# Patient Record
Sex: Male | Born: 1955
Health system: Southern US, Community
[De-identification: ages and names within clinical notes are randomized; demographics above are authoritative.]

## PROBLEM LIST (undated history)

## (undated) DIAGNOSIS — K861 Other chronic pancreatitis: Secondary | ICD-10-CM

## (undated) DIAGNOSIS — Z91018 Allergy to other foods: Secondary | ICD-10-CM

## (undated) DIAGNOSIS — A4902 Methicillin resistant Staphylococcus aureus infection, unspecified site: Secondary | ICD-10-CM

## (undated) DIAGNOSIS — Z87442 Personal history of urinary calculi: Secondary | ICD-10-CM

## (undated) DIAGNOSIS — M5020 Other cervical disc displacement, unspecified cervical region: Secondary | ICD-10-CM

## (undated) DIAGNOSIS — I10 Essential (primary) hypertension: Secondary | ICD-10-CM

## (undated) DIAGNOSIS — IMO0002 Reserved for concepts with insufficient information to code with codable children: Secondary | ICD-10-CM

## (undated) HISTORY — DX: Methicillin resistant Staphylococcus aureus infection, unspecified site: A49.02

## (undated) HISTORY — DX: Personal history of urinary calculi: Z87.442

## (undated) HISTORY — PX: DUPUYTREN CONTRACTURE RELEASE: SHX1478

## (undated) HISTORY — DX: Allergy to other foods: Z91.018

## (undated) HISTORY — DX: Other chronic pancreatitis: K86.1

## (undated) HISTORY — DX: Reserved for concepts with insufficient information to code with codable children: IMO0002

## (undated) HISTORY — DX: Essential (primary) hypertension: I10

---

## 1997-04-27 ENCOUNTER — Other Ambulatory Visit: Admission: RE | Admit: 1997-04-27 | Discharge: 1997-04-27 | Payer: Self-pay | Admitting: Obstetrics and Gynecology

## 1997-05-24 ENCOUNTER — Other Ambulatory Visit: Admission: RE | Admit: 1997-05-24 | Discharge: 1997-05-24 | Payer: Self-pay | Admitting: Obstetrics and Gynecology

## 2006-10-07 ENCOUNTER — Emergency Department (HOSPITAL_COMMUNITY): Admission: EM | Admit: 2006-10-07 | Discharge: 2006-10-07 | Payer: Self-pay | Admitting: Family Medicine

## 2006-10-09 ENCOUNTER — Emergency Department (HOSPITAL_COMMUNITY): Admission: EM | Admit: 2006-10-09 | Discharge: 2006-10-09 | Payer: Self-pay | Admitting: Emergency Medicine

## 2006-12-23 ENCOUNTER — Ambulatory Visit: Payer: Self-pay | Admitting: Gastroenterology

## 2006-12-30 ENCOUNTER — Ambulatory Visit: Payer: Self-pay | Admitting: Gastroenterology

## 2007-10-31 ENCOUNTER — Encounter: Admission: RE | Admit: 2007-10-31 | Discharge: 2007-10-31 | Payer: Self-pay | Admitting: Sports Medicine

## 2007-11-15 ENCOUNTER — Encounter: Admission: RE | Admit: 2007-11-15 | Discharge: 2007-11-15 | Payer: Self-pay | Admitting: Sports Medicine

## 2008-02-07 ENCOUNTER — Encounter: Admission: RE | Admit: 2008-02-07 | Discharge: 2008-02-07 | Payer: Self-pay | Admitting: Sports Medicine

## 2008-03-26 ENCOUNTER — Encounter: Payer: Self-pay | Admitting: Cardiovascular Disease

## 2008-03-26 ENCOUNTER — Ambulatory Visit: Payer: Self-pay | Admitting: Cardiovascular Disease

## 2008-03-26 ENCOUNTER — Ambulatory Visit: Payer: Self-pay

## 2009-03-21 ENCOUNTER — Encounter: Admission: RE | Admit: 2009-03-21 | Discharge: 2009-03-21 | Payer: Self-pay | Admitting: Neurosurgery

## 2009-03-24 ENCOUNTER — Encounter: Admission: RE | Admit: 2009-03-24 | Discharge: 2009-03-24 | Payer: Self-pay | Admitting: Neurosurgery

## 2010-02-09 ENCOUNTER — Encounter: Payer: Self-pay | Admitting: Urology

## 2010-10-28 ENCOUNTER — Inpatient Hospital Stay (INDEPENDENT_AMBULATORY_CARE_PROVIDER_SITE_OTHER)
Admission: RE | Admit: 2010-10-28 | Discharge: 2010-10-28 | Disposition: A | Discharge status: Home / Self Care | Payer: BC Managed Care – PPO | Source: Ambulatory Visit | Attending: Emergency Medicine | Admitting: Emergency Medicine

## 2010-10-28 DIAGNOSIS — L539 Erythematous condition, unspecified: Secondary | ICD-10-CM

## 2010-10-30 LAB — URINE MICROSCOPIC-ADD ON

## 2010-10-30 LAB — POCT URINALYSIS DIP (DEVICE)
Glucose, UA: NEGATIVE
Operator id: 126494
Specific Gravity, Urine: 1.015
Urobilinogen, UA: 0.2

## 2010-10-30 LAB — URINALYSIS, ROUTINE W REFLEX MICROSCOPIC
Glucose, UA: 100 — AB
Leukocytes, UA: NEGATIVE
Nitrite: NEGATIVE
Protein, ur: NEGATIVE
Urobilinogen, UA: 0.2

## 2012-05-11 ENCOUNTER — Emergency Department (HOSPITAL_COMMUNITY): Payer: 59

## 2012-05-11 ENCOUNTER — Emergency Department (HOSPITAL_COMMUNITY)
Admission: EM | Admit: 2012-05-11 | Discharge: 2012-05-11 | Disposition: A | Discharge status: Home / Self Care | Payer: 59 | Attending: Emergency Medicine | Admitting: Emergency Medicine

## 2012-05-11 DIAGNOSIS — IMO0001 Reserved for inherently not codable concepts without codable children: Secondary | ICD-10-CM | POA: Insufficient documentation

## 2012-05-11 DIAGNOSIS — Y9389 Activity, other specified: Secondary | ICD-10-CM | POA: Insufficient documentation

## 2012-05-11 DIAGNOSIS — F0781 Postconcussional syndrome: Secondary | ICD-10-CM | POA: Insufficient documentation

## 2012-05-11 DIAGNOSIS — R51 Headache: Secondary | ICD-10-CM | POA: Insufficient documentation

## 2012-05-11 DIAGNOSIS — R42 Dizziness and giddiness: Secondary | ICD-10-CM | POA: Insufficient documentation

## 2012-05-11 DIAGNOSIS — Y92410 Unspecified street and highway as the place of occurrence of the external cause: Secondary | ICD-10-CM | POA: Insufficient documentation

## 2012-05-11 LAB — POCT I-STAT, CHEM 8
BUN: 16 mg/dL (ref 6–23)
Calcium, Ion: 1.16 mmol/L (ref 1.12–1.23)
Creatinine, Ser: 1.1 mg/dL (ref 0.50–1.35)
Glucose, Bld: 100 mg/dL — ABNORMAL HIGH (ref 70–99)
Hemoglobin: 14.6 g/dL (ref 13.0–17.0)
TCO2: 24 mmol/L (ref 0–100)

## 2012-05-11 LAB — URINALYSIS, ROUTINE W REFLEX MICROSCOPIC
Bilirubin Urine: NEGATIVE
Hgb urine dipstick: NEGATIVE
Ketones, ur: NEGATIVE mg/dL
Protein, ur: NEGATIVE mg/dL
Urobilinogen, UA: 0.2 mg/dL (ref 0.0–1.0)

## 2012-05-11 MED ORDER — HYDROCODONE-ACETAMINOPHEN 5-325 MG PO TABS
1.0000 | ORAL_TABLET | Freq: Once | ORAL | Status: AC
  Administered 2012-05-11: 1 via ORAL
  Filled 2012-05-11: qty 1

## 2012-05-11 MED ORDER — DIAZEPAM 2 MG PO TABS
2.0000 mg | ORAL_TABLET | Freq: Once | ORAL | Status: AC
  Administered 2012-05-11: 2 mg via ORAL
  Filled 2012-05-11 (×2): qty 1

## 2012-05-11 MED ORDER — IBUPROFEN 200 MG PO TABS
600.0000 mg | ORAL_TABLET | Freq: Once | ORAL | Status: AC
  Administered 2012-05-11: 600 mg via ORAL
  Filled 2012-05-11: qty 1
  Filled 2012-05-11: qty 3

## 2012-05-11 MED ORDER — HYDROCODONE-ACETAMINOPHEN 5-325 MG PO TABS: 1.0000 | ORAL_TABLET | Freq: Four times a day (QID) | ORAL | Status: DC | PRN

## 2012-05-11 MED ORDER — NAPROXEN 500 MG PO TABS: 500.0000 mg | ORAL_TABLET | Freq: Two times a day (BID) | ORAL | Status: DC

## 2012-05-11 MED ORDER — DIAZEPAM 5 MG PO TABS: 5.0000 mg | ORAL_TABLET | Freq: Four times a day (QID) | ORAL | Status: DC | PRN

## 2012-05-11 NOTE — ED Notes (Signed)
Patient transported to radiology.Will obtain blood for labs upon patient's return

## 2012-05-11 NOTE — ED Notes (Signed)
PA at bedside.

## 2012-05-11 NOTE — ED Notes (Signed)
Patient transported to X-ray 

## 2012-05-11 NOTE — ED Notes (Signed)
MD at bedside. 
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
## 2012-05-11 NOTE — ED Notes (Signed)
Bed:WA25<BR> Expected date:<BR> Expected time:<BR> Means of arrival:<BR> Comments:<BR> ems

## 2012-05-11 NOTE — ED Provider Notes (Signed)
Medical screening examination/treatment/procedure(s) were performed by non-physician practitioner and as supervising physician I was immediately available for consultation/collaboration.   Lyanne Co, MD 05/11/12 2146
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
## 2012-05-11 NOTE — ED Notes (Signed)
MD at bedside./ PA

## 2012-05-27 ENCOUNTER — Emergency Department (INDEPENDENT_AMBULATORY_CARE_PROVIDER_SITE_OTHER)
Admission: EM | Admit: 2012-05-27 | Discharge: 2012-05-27 | Disposition: A | Discharge status: Home / Self Care | Payer: Self-pay | Source: Home / Self Care | Attending: Family Medicine | Admitting: Family Medicine

## 2012-05-27 ENCOUNTER — Encounter (HOSPITAL_COMMUNITY): Payer: Self-pay | Admitting: *Deleted

## 2012-05-27 ENCOUNTER — Ambulatory Visit: Payer: Self-pay

## 2012-05-27 DIAGNOSIS — M549 Dorsalgia, unspecified: Secondary | ICD-10-CM

## 2012-05-27 DIAGNOSIS — M542 Cervicalgia: Secondary | ICD-10-CM

## 2012-05-27 MED ORDER — CYCLOBENZAPRINE HCL 5 MG PO TABS: 5.0000 mg | ORAL_TABLET | Freq: Three times a day (TID) | ORAL | Status: DC | PRN

## 2012-05-27 MED ORDER — DICLOFENAC POTASSIUM 50 MG PO TABS: 50.0000 mg | ORAL_TABLET | Freq: Three times a day (TID) | ORAL | Status: DC
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
## 2012-05-27 NOTE — ED Notes (Signed)
Pt  Also  Reports  The  Sensation of  Tingling in his  Fingers
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
## 2012-05-30 ENCOUNTER — Encounter (HOSPITAL_COMMUNITY): Payer: Self-pay | Admitting: Emergency Medicine

## 2012-05-30 ENCOUNTER — Emergency Department (HOSPITAL_COMMUNITY)
Admission: EM | Admit: 2012-05-30 | Discharge: 2012-05-30 | Disposition: A | Discharge status: Home / Self Care | Payer: 59 | Attending: Emergency Medicine | Admitting: Emergency Medicine

## 2012-05-30 DIAGNOSIS — I10 Essential (primary) hypertension: Secondary | ICD-10-CM | POA: Insufficient documentation

## 2012-05-30 DIAGNOSIS — Z87891 Personal history of nicotine dependence: Secondary | ICD-10-CM | POA: Insufficient documentation

## 2012-05-30 DIAGNOSIS — Z8739 Personal history of other diseases of the musculoskeletal system and connective tissue: Secondary | ICD-10-CM | POA: Insufficient documentation

## 2012-05-30 DIAGNOSIS — Z79899 Other long term (current) drug therapy: Secondary | ICD-10-CM | POA: Insufficient documentation

## 2012-05-30 HISTORY — DX: Other cervical disc displacement, unspecified cervical region: M50.20

## 2012-05-30 LAB — POCT I-STAT, CHEM 8
BUN: 19 mg/dL (ref 6–23)
Creatinine, Ser: 1 mg/dL (ref 0.50–1.35)
Glucose, Bld: 91 mg/dL (ref 70–99)
Hemoglobin: 15.6 g/dL (ref 13.0–17.0)
TCO2: 25 mmol/L (ref 0–100)

## 2012-05-30 MED ORDER — HYDROCHLOROTHIAZIDE 25 MG PO TABS: 25.0000 mg | ORAL_TABLET | Freq: Every day | ORAL | Status: DC

## 2012-05-30 NOTE — ED Notes (Signed)
Pt reports "high blood pressure x 4 days" Pt complains of headache as well.
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
## 2012-06-10 ENCOUNTER — Ambulatory Visit (INDEPENDENT_AMBULATORY_CARE_PROVIDER_SITE_OTHER): Payer: 59 | Admitting: Internal Medicine

## 2012-06-10 VITALS — BP 180/96 | HR 88 | Temp 98.2°F | Resp 16 | Ht 70.25 in | Wt 182.6 lb

## 2012-06-10 DIAGNOSIS — I10 Essential (primary) hypertension: Secondary | ICD-10-CM

## 2012-06-10 DIAGNOSIS — R42 Dizziness and giddiness: Secondary | ICD-10-CM

## 2012-06-10 MED ORDER — LISINOPRIL-HYDROCHLOROTHIAZIDE 10-12.5 MG PO TABS: 1.0000 | ORAL_TABLET | Freq: Every day | ORAL | Status: DC
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
## 2012-06-10 NOTE — Patient Instructions (Signed)

## 2012-10-03 ENCOUNTER — Telehealth: Payer: Self-pay

## 2012-10-03 NOTE — Telephone Encounter (Signed)
Pt states since taking Lisinopril he has developed a persistent cough which he now associates with the timing of starting the meds,he would like change in rx.   Best phone [phone]   [facility] [address]

## 2012-10-04 NOTE — Telephone Encounter (Signed)
I have documented cough with lisinopril, patient has d/c this medication, what would you like to use instead?

## 2012-10-05 ENCOUNTER — Telehealth: Payer: Self-pay | Admitting: Radiology

## 2012-10-05 MED ORDER — LOSARTAN POTASSIUM 50 MG PO TABS: 50.0000 mg | ORAL_TABLET | Freq: Every day | ORAL | Status: DC

## 2012-10-05 NOTE — Telephone Encounter (Signed)
Will try losartan 50mg  daily.  Continue HCTZ 25mg  daily.  Monitor home BPs.  Follow up in 1 month

## 2012-10-05 NOTE — Telephone Encounter (Signed)
Thanks calld pt to advise

## 2012-10-05 NOTE — Telephone Encounter (Signed)
Pt has cough from Lisinopril , mssg sent to Dr Perrin Maltese, but Dr Perrin Maltese out of office. Patient has d/c meds, wants something else sent in. Phone [phone]

## 2012-10-07 NOTE — Telephone Encounter (Signed)
Joel Black has advised to try Losartan, patient has been advised to do this and will follow up as planned. He was advised to call if this does not help with his hypertension. He will check his BP weekly. To you FYI

## 2012-10-07 NOTE — Telephone Encounter (Signed)
rtc 

## 2012-10-21 ENCOUNTER — Ambulatory Visit: Payer: 59

## 2012-10-21 ENCOUNTER — Ambulatory Visit: Payer: 59 | Admitting: Family Medicine

## 2012-10-21 VITALS — BP 150/100 | HR 78 | Temp 98.1°F | Resp 18 | Ht 70.5 in | Wt 186.8 lb

## 2012-10-21 DIAGNOSIS — M5020 Other cervical disc displacement, unspecified cervical region: Secondary | ICD-10-CM

## 2012-10-21 DIAGNOSIS — I10 Essential (primary) hypertension: Secondary | ICD-10-CM

## 2012-10-21 MED ORDER — PREDNISONE 20 MG PO TABS: ORAL_TABLET | ORAL | Status: DC

## 2012-10-21 MED ORDER — LOSARTAN POTASSIUM 50 MG PO TABS: 50.0000 mg | ORAL_TABLET | Freq: Two times a day (BID) | ORAL | Status: DC

## 2012-10-21 MED ORDER — CYCLOBENZAPRINE HCL 5 MG PO TABS
ORAL_TABLET | ORAL | Status: DC
Start: 2012-10-21 — End: 2013-07-27
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
## 2012-10-21 NOTE — Patient Instructions (Addendum)
Please let me know how you are doing with your neck and arm in the next few days- Sooner if worse.   Remember not to take NSAID type medications such as aleve or ibuprofen while you are on the prednisone.

## 2012-10-22 LAB — COMPREHENSIVE METABOLIC PANEL
ALT: 24 U/L (ref 0–53)
AST: 20 U/L (ref 0–37)
Albumin: 4.5 g/dL (ref 3.5–5.2)
CO2: 28 mEq/L (ref 19–32)
Calcium: 9.5 mg/dL (ref 8.4–10.5)
Chloride: 104 mEq/L (ref 96–112)
Creat: 1.1 mg/dL (ref 0.50–1.35)
Potassium: 4.3 mEq/L (ref 3.5–5.3)
Total Protein: 7.2 g/dL (ref 6.0–8.3)

## 2012-10-23 ENCOUNTER — Encounter: Payer: Self-pay | Admitting: Family Medicine

## 2012-10-31 ENCOUNTER — Telehealth: Payer: Self-pay

## 2012-10-31 DIAGNOSIS — R200 Anesthesia of skin: Secondary | ICD-10-CM

## 2012-10-31 DIAGNOSIS — M542 Cervicalgia: Secondary | ICD-10-CM

## 2012-10-31 NOTE — Telephone Encounter (Signed)
Called and spoke with him.  He feels 50% better but is about to finish up the prednisone.  He would like to hold off on the MRI right now due to cost concerns, but will call me if he does want to go ahead with it.  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  He would appreciate a letter to this effect.

## 2012-10-31 NOTE — Telephone Encounter (Signed)
Called him back- no answer.  LMOM, will try back

## 2012-10-31 NOTE — Telephone Encounter (Signed)
Your note indicated if not better, proceed with MRI , pended the referral please advise

## 2012-10-31 NOTE — Telephone Encounter (Signed)
PT WOULD LIKE TO SPEAK WITH SOMEONE ABOUT HIS CONDITION. WAS SEEN FOR NECK AND ARM PAIN, IS ABOUT 50% BETTER, BUT DON'T THINK HE WILL EVER BE ABLE TO DO HIS JOB ANYMORE PLEASE CALL [phone]. MAY NEED THE DR TO WRITE A DOCUMENT ABOUT HIS PROBLEM

## 2012-11-05 ENCOUNTER — Telehealth: Payer: Self-pay

## 2012-11-05 DIAGNOSIS — M5020 Other cervical disc displacement, unspecified cervical region: Secondary | ICD-10-CM

## 2012-11-05 NOTE — Telephone Encounter (Signed)
Spoke with pt, he wants to know if he should get another course of of prednisone before getting an MRI done. He is still only 50% better. Dr Patsy Lager please advise.

## 2012-11-06 MED ORDER — PREDNISONE 20 MG PO TABS: ORAL_TABLET | ORAL | Status: DC

## 2012-11-06 NOTE — Telephone Encounter (Signed)
Called and left detailed message.  He took a 10 day course of prednisone starting on 10/3 which did help.  It would be reasonable to try a second course prior to doing an MRI if he would like.   Will send an rx for another 40x5, 20x5 course of prednisone.  Would recommend that he wait a few more days to allow at least a full week to pass since he finished the last course.

## 2012-11-07 ENCOUNTER — Ambulatory Visit (INDEPENDENT_AMBULATORY_CARE_PROVIDER_SITE_OTHER): Payer: 59 | Admitting: Physician Assistant

## 2012-11-07 ENCOUNTER — Encounter: Payer: Self-pay | Admitting: Physician Assistant

## 2012-11-07 VITALS — BP 140/84 | HR 88 | Temp 98.5°F | Resp 16 | Ht 70.5 in | Wt 183.2 lb

## 2012-11-07 DIAGNOSIS — R030 Elevated blood-pressure reading, without diagnosis of hypertension: Secondary | ICD-10-CM

## 2012-11-07 DIAGNOSIS — Z0000 Encounter for general adult medical examination without abnormal findings: Secondary | ICD-10-CM

## 2012-11-07 DIAGNOSIS — I10 Essential (primary) hypertension: Secondary | ICD-10-CM

## 2012-11-07 DIAGNOSIS — M25521 Pain in right elbow: Secondary | ICD-10-CM

## 2012-11-07 DIAGNOSIS — R202 Paresthesia of skin: Secondary | ICD-10-CM

## 2012-11-07 DIAGNOSIS — M25529 Pain in unspecified elbow: Secondary | ICD-10-CM

## 2012-11-07 DIAGNOSIS — R21 Rash and other nonspecific skin eruption: Secondary | ICD-10-CM

## 2012-11-07 DIAGNOSIS — R252 Cramp and spasm: Secondary | ICD-10-CM

## 2012-11-07 LAB — CBC WITH DIFFERENTIAL/PLATELET
Eosinophils Absolute: 0.2 10*3/uL (ref 0.0–0.7)
Eosinophils Relative: 3 % (ref 0–5)
HCT: 41.3 % (ref 39.0–52.0)
Hemoglobin: 14.8 g/dL (ref 13.0–17.0)
Lymphocytes Relative: 25 % (ref 12–46)
Lymphs Abs: 1.5 10*3/uL (ref 0.7–4.0)
MCH: 33.1 pg (ref 26.0–34.0)
MCV: 92.4 fL (ref 78.0–100.0)
Monocytes Relative: 10 % (ref 3–12)
RBC: 4.47 MIL/uL (ref 4.22–5.81)
WBC: 6.1 10*3/uL (ref 4.0–10.5)

## 2012-11-07 LAB — COMPREHENSIVE METABOLIC PANEL
ALT: 44 U/L (ref 0–53)
CO2: 26 mEq/L (ref 19–32)
Calcium: 9.3 mg/dL (ref 8.4–10.5)
Chloride: 107 mEq/L (ref 96–112)
Creat: 1.03 mg/dL (ref 0.50–1.35)
Glucose, Bld: 103 mg/dL — ABNORMAL HIGH (ref 70–99)
Total Bilirubin: 0.5 mg/dL (ref 0.3–1.2)
Total Protein: 6.6 g/dL (ref 6.0–8.3)

## 2012-11-07 LAB — LIPID PANEL
Cholesterol: 198 mg/dL (ref 0–200)
HDL: 56 mg/dL (ref >39)
Total CHOL/HDL Ratio: 3.5 Ratio

## 2012-11-07 LAB — IFOBT (OCCULT BLOOD): IFOBT: NEGATIVE

## 2012-11-07 MED ORDER — HYDROCHLOROTHIAZIDE 25 MG PO TABS: 25.0000 mg | ORAL_TABLET | Freq: Every day | ORAL | Status: DC

## 2012-11-07 MED ORDER — LOSARTAN POTASSIUM 50 MG PO TABS: 50.0000 mg | ORAL_TABLET | Freq: Two times a day (BID) | ORAL | Status: DC
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
## 2012-11-07 NOTE — Progress Notes (Signed)
  Subjective:    Patient ID: Joel Black, male    DOB: February 21, 1955, 57 y.o.   MRN: [id number]  HPI    Review of Systems  Constitutional: Positive for fatigue.  HENT: Positive for dental problem.   Respiratory: Negative.   Cardiovascular: Negative.   Gastrointestinal: Negative.   Endocrine: Negative.   Genitourinary: Negative.   Musculoskeletal: Positive for myalgias.  Skin: Negative.   Allergic/Immunologic: Negative.   Neurological: Negative.   Hematological: Negative.   Psychiatric/Behavioral: Positive for agitation.       Objective:   Physical Exam        Assessment & Plan:

## 2012-11-08 ENCOUNTER — Other Ambulatory Visit: Payer: Self-pay

## 2012-11-08 DIAGNOSIS — I10 Essential (primary) hypertension: Secondary | ICD-10-CM

## 2012-11-08 LAB — B. BURGDORFI ANTIBODIES: B burgdorferi Ab IgG+IgM: 0.65 {ISR}

## 2012-11-08 LAB — TSH: TSH: 1.653 u[IU]/mL (ref 0.350–4.500)

## 2012-11-08 LAB — PSA: PSA: 0.65 ng/mL (ref <=4.00)

## 2012-11-08 LAB — EHRLICHIA ANTIBODY PANEL

## 2012-11-08 MED ORDER — HYDROCHLOROTHIAZIDE 25 MG PO TABS: 25.0000 mg | ORAL_TABLET | Freq: Every day | ORAL | Status: DC

## 2012-11-15 ENCOUNTER — Telehealth: Payer: Self-pay

## 2012-11-15 NOTE — Telephone Encounter (Signed)
RYAN - PT WOULD LIKE TO KNOW IF HIS MRI RESULTS ARE BACK [phone]

## 2012-11-16 NOTE — Telephone Encounter (Signed)
Advised him it is not final yet. Will check again for him tomorrow. To you FYI

## 2012-11-18 NOTE — Telephone Encounter (Signed)
Any update on this?

## 2012-11-18 NOTE — Telephone Encounter (Signed)
Called patient advised degenerative changes only, but is the xray report. He has not had the scan yet. He is asking for a letter regarding his work status. 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 Decreased muscle tone.

## 2012-11-18 NOTE — Telephone Encounter (Signed)
Called for peer-to-peer.  Dr. Lahoma Rocker approved request. 45 days (01/02/2013) [id number]

## 2012-11-18 NOTE — Telephone Encounter (Signed)
This has been denied, by insurance. Patient complains of loss of strength in his right arm, loss of muscle tone also. There is documentation of weakness of the arm in the notes. We can call for peer to peer to try to get the insurance to reconsider given this information, is [employer] 1 [id number] option 3 the insurance number is [id number]. Can one of you call for him to get the prior authorization>? He needs the scan.

## 2012-11-19 NOTE — Telephone Encounter (Signed)
Await results

## 2012-11-21 ENCOUNTER — Other Ambulatory Visit: Payer: Self-pay | Admitting: Radiology

## 2012-11-21 ENCOUNTER — Ambulatory Visit: Payer: 59

## 2012-11-21 DIAGNOSIS — Z1389 Encounter for screening for other disorder: Secondary | ICD-10-CM

## 2012-11-21 NOTE — Telephone Encounter (Signed)
Amy spoke with Lupita Leash and gave her the prior auth number.

## 2012-11-21 NOTE — Telephone Encounter (Signed)
Thanks. Patient scheduled Orbits ordered, he is a Psychologist, educational.

## 2012-11-25 ENCOUNTER — Ambulatory Visit
Admission: RE | Admit: 2012-11-25 | Discharge: 2012-11-25 | Disposition: A | Discharge status: Home / Self Care | Payer: 59 | Source: Ambulatory Visit | Attending: Physician Assistant | Admitting: Physician Assistant

## 2012-11-25 DIAGNOSIS — Z1389 Encounter for screening for other disorder: Secondary | ICD-10-CM

## 2012-11-27 ENCOUNTER — Ambulatory Visit
Admission: RE | Admit: 2012-11-27 | Discharge: 2012-11-27 | Disposition: A | Discharge status: Home / Self Care | Payer: 59 | Source: Ambulatory Visit | Attending: Physician Assistant | Admitting: Physician Assistant

## 2012-11-27 DIAGNOSIS — R202 Paresthesia of skin: Secondary | ICD-10-CM

## 2012-11-27 DIAGNOSIS — M25521 Pain in right elbow: Secondary | ICD-10-CM

## 2012-11-28 ENCOUNTER — Other Ambulatory Visit: Payer: Self-pay | Admitting: Physician Assistant

## 2012-11-28 DIAGNOSIS — M4802 Spinal stenosis, cervical region: Secondary | ICD-10-CM

## 2012-11-28 DIAGNOSIS — R29898 Other symptoms and signs involving the musculoskeletal system: Secondary | ICD-10-CM

## 2012-12-07 ENCOUNTER — Telehealth: Payer: Self-pay

## 2012-12-07 NOTE — Telephone Encounter (Signed)
PT WOULD LIKE TO KNOW IF WE COULD REFER HIM TO DR Venetia Maxon THE NEURO SURGEON , HAS HAD HIM YEARS AGO AND WOULD LIKE TO SEE HIM AGAIN PLEASE CALL [phone]

## 2012-12-07 NOTE — Telephone Encounter (Signed)
Information was sent called him he has not heard back yet. Will you check on this referral? It was sent on 11/10

## 2013-03-29 ENCOUNTER — Encounter (HOSPITAL_COMMUNITY): Payer: Self-pay | Admitting: Emergency Medicine

## 2013-03-29 ENCOUNTER — Emergency Department (HOSPITAL_COMMUNITY)
Admission: EM | Admit: 2013-03-29 | Discharge: 2013-03-29 | Disposition: A | Discharge status: Home / Self Care | Payer: 59 | Source: Home / Self Care | Attending: Family Medicine | Admitting: Family Medicine

## 2013-03-29 DIAGNOSIS — M109 Gout, unspecified: Secondary | ICD-10-CM

## 2013-03-29 LAB — URIC ACID: URIC ACID, SERUM: 5.1 mg/dL (ref 4.0–7.8)

## 2013-03-29 LAB — CBC WITH DIFFERENTIAL/PLATELET
BASOS ABS: 0 10*3/uL (ref 0.0–0.1)
BASOS PCT: 0 % (ref 0–1)
EOS ABS: 0.3 10*3/uL (ref 0.0–0.7)
EOS PCT: 2 % (ref 0–5)
HCT: 44.2 % (ref 39.0–52.0)
Hemoglobin: 15.9 g/dL (ref 13.0–17.0)
LYMPHS PCT: 22 % (ref 12–46)
Lymphs Abs: 2.6 10*3/uL (ref 0.7–4.0)
MCH: 33 pg (ref 26.0–34.0)
MCHC: 36 g/dL (ref 30.0–36.0)
MCV: 91.7 fL (ref 78.0–100.0)
Monocytes Absolute: 1 10*3/uL (ref 0.1–1.0)
Monocytes Relative: 8 % (ref 3–12)
Neutro Abs: 8 10*3/uL — ABNORMAL HIGH (ref 1.7–7.7)
Neutrophils Relative %: 67 % (ref 43–77)
PLATELETS: 299 10*3/uL (ref 150–400)
RBC: 4.82 MIL/uL (ref 4.22–5.81)
RDW: 12.5 % (ref 11.5–15.5)
WBC: 11.9 10*3/uL — AB (ref 4.0–10.5)

## 2013-03-29 MED ORDER — PREDNISONE 20 MG PO TABS
ORAL_TABLET | ORAL | Status: AC
  Filled 2013-03-29: qty 3

## 2013-03-29 MED ORDER — NAPROXEN 500 MG PO TABS: 500.0000 mg | ORAL_TABLET | Freq: Two times a day (BID) | ORAL | Status: DC

## 2013-03-29 MED ORDER — COLCHICINE 0.6 MG PO TABS: 0.6000 mg | ORAL_TABLET | Freq: Two times a day (BID) | ORAL | Status: DC

## 2013-03-29 MED ORDER — HYDROCODONE-ACETAMINOPHEN 5-325 MG PO TABS: 1.0000 | ORAL_TABLET | ORAL | Status: DC | PRN

## 2013-03-29 MED ORDER — PREDNISONE 20 MG PO TABS
60.0000 mg | ORAL_TABLET | Freq: Once | ORAL | Status: AC
  Administered 2013-03-29: 60 mg via ORAL

## 2013-03-29 MED ORDER — PREDNISONE 10 MG PO KIT: PACK | ORAL | Status: DC

## 2013-03-29 NOTE — ED Notes (Signed)
Patient complains of left great toe pain; pain started today; states some heat at left great toe.

## 2013-03-29 NOTE — Discharge Instructions (Signed)

## 2013-03-29 NOTE — ED Notes (Deleted)
Patient states she twisted her ankle this evening after stepping off the curb at 6:50pm this afternoon; states can't put any weight on right foot; states numbness and swelling.  

## 2013-03-29 NOTE — ED Provider Notes (Signed)
CSN: [id number]     Arrival date & time 03/29/13  1943 History   First MD Initiated Contact with Patient 03/29/13 2014     Chief Complaint  Patient presents with  . Toe Pain   (Consider location/radiation/quality/duration/timing/severity/associated sxs/prior Treatment) HPI Comments: 58 year old male presents for evaluation of severe left great toe pain. This began fairly acutely this evening, about 3 hours ago. He first noticed some pain at about noon breakout a lot worse at around 5 PM. The toe is red, swollen, and tender. He has never had this before. He has no history of gout. He does not feel sick. He rates the pain as 10 out of 10.  Patient is a 58 y.o. male presenting with toe pain.  Toe Pain Pertinent negatives include no chest pain, no abdominal pain and no shortness of breath.    Past Medical History  Diagnosis Date  . Herniated cervical disc   . Ulcer   . Hypertension    History reviewed. No pertinent past surgical history. Family History  Problem Relation Age of Onset  . Cancer Father     lung  . Diabetes Sister    History  Substance Use Topics  . Smoking status: Former Smoker    Types: Cigarettes    Quit date: 06/11/1986  . Smokeless tobacco: Not on file  . Alcohol Use: Yes     Comment: 2 glasses with dinner    Review of Systems  Constitutional: Negative for fever, chills and fatigue.  HENT: Negative for sore throat.   Eyes: Negative for visual disturbance.  Respiratory: Negative for cough and shortness of breath.   Cardiovascular: Negative for chest pain, palpitations and leg swelling.  Gastrointestinal: Negative for nausea, vomiting, abdominal pain, diarrhea and constipation.  Genitourinary: Negative for dysuria, urgency, frequency and hematuria.  Musculoskeletal: Positive for arthralgias.       See history of present illness  Skin: Positive for color change. Negative for rash.  Neurological: Negative for dizziness, weakness and light-headedness.     Allergies  Review of patient's allergies indicates no active allergies.  Home Medications   Current Outpatient Rx  Name  Route  Sig  Dispense  Refill  . aspirin 81 MG tablet   Oral   Take 81 mg by mouth daily.         Marland Kitchen ibuprofen (ADVIL,MOTRIN) 200 MG tablet   Oral   Take 200 mg by mouth every 6 (six) hours as needed for pain.         Marland Kitchen lisinopril-hydrochlorothiazide (PRINZIDE,ZESTORETIC) 10-12.5 MG per tablet   Oral   Take 1 tablet by mouth daily.         Marland Kitchen losartan (COZAAR) 50 MG tablet   Oral   Take 1 tablet (50 mg total) by mouth 2 (two) times daily.   180 tablet   1   . Omega-3 Fatty Acids (FISH OIL PO)   Oral   Take by mouth.         . colchicine 0.6 MG tablet   Oral   Take 1 tablet (0.6 mg total) by mouth 2 (two) times daily.   30 tablet   0   . cyclobenzaprine (FLEXERIL) 5 MG tablet      Take 1/2 or 1 tablet twice a day as needed for muscle spasm   30 tablet   0   . diazepam (VALIUM) 5 MG tablet   Oral   Take 1 tablet (5 mg total) by mouth every 6 (six) hours  as needed for anxiety.   15 tablet   0   . hydrochlorothiazide (HYDRODIURIL) 25 MG tablet   Oral   Take 1 tablet (25 mg total) by mouth daily.   90 tablet   1   . HYDROcodone-acetaminophen (NORCO) 5-325 MG per tablet   Oral   Take 1 tablet by mouth every 4 (four) hours as needed for moderate pain.   20 tablet   0   . naproxen (NAPROSYN) 500 MG tablet   Oral   Take 1 tablet (500 mg total) by mouth 2 (two) times daily.   30 tablet   0   . predniSONE (DELTASONE) 20 MG tablet      Take 2 pills by mouth daily for 5 days, then 1 pill by mouth daily for 5 dyas   15 tablet   0   . PredniSONE 10 MG KIT      12 day taper dose pack. Use as directed   48 each   0    BP 195/114  Pulse 81  Temp(Src) 97.4 F (36.3 C) (Oral)  SpO2 95% Physical Exam  Nursing note and vitals reviewed. Constitutional: He is oriented to person, place, and time. He appears well-developed and  well-nourished. No distress.  HENT:  Head: Normocephalic.  Cardiovascular: Normal rate, regular rhythm and normal heart sounds.   Pulmonary/Chest: Effort normal and breath sounds normal. No respiratory distress.  Musculoskeletal:       Left foot: He exhibits tenderness and swelling. He exhibits normal capillary refill.       Feet:  Neurological: He is alert and oriented to person, place, and time. Coordination normal.  Skin: Skin is warm and dry. No rash noted. He is not diaphoretic.  Psychiatric: He has a normal mood and affect. Judgment normal.    ED Course  Procedures (including critical care time) Labs Review Labs Reviewed  CBC WITH DIFFERENTIAL - Abnormal; Notable for the following:    WBC 11.9 (*)    Neutro Abs 8.0 (*)    All other components within normal limits  URIC ACID   Imaging Review No results found.   MDM   1. Podagra    Minimal neutrophilic leukocytosis c/w gout.  Afebrile and no overlying breaks in skin which may lead to septic joint.  I believe this is an initial gout attack. Uric acid level is pending. Treating with colchicine, prednisone, naprosyn, norco.  F/u if not improving in a few days.    Discharge Medication List as of 03/29/2013  8:53 PM    START taking these medications   Details  colchicine 0.6 MG tablet Take 1 tablet (0.6 mg total) by mouth 2 (two) times daily., Starting 03/29/2013, Until Discontinued, Print    HYDROcodone-acetaminophen (NORCO) 5-325 MG per tablet Take 1 tablet by mouth every 4 (four) hours as needed for moderate pain., Starting 03/29/2013, Until Discontinued, Print    naproxen (NAPROSYN) 500 MG tablet Take 1 tablet (500 mg total) by mouth 2 (two) times daily., Starting 03/29/2013, Until Discontinued, Print    PredniSONE 10 MG KIT 12 day taper dose pack. Use as directed, Print           Liam Graham, PA-C 03/29/13 2149

## 2013-03-30 NOTE — ED Provider Notes (Signed)
Medical screening examination/treatment/procedure(s) were performed by a resident physician or non-physician practitioner and as the supervising physician I was immediately available for consultation/collaboration.  Elon Lomeli, MD    Gennavieve Huq S Uyen Eichholz, MD 03/30/13 0810 

## 2013-07-27 ENCOUNTER — Ambulatory Visit (INDEPENDENT_AMBULATORY_CARE_PROVIDER_SITE_OTHER): Payer: 59

## 2013-07-27 ENCOUNTER — Ambulatory Visit (HOSPITAL_COMMUNITY)
Admission: RE | Admit: 2013-07-27 | Discharge: 2013-07-27 | Disposition: A | Discharge status: Home / Self Care | Payer: 59 | Source: Ambulatory Visit | Attending: Family Medicine | Admitting: Family Medicine

## 2013-07-27 ENCOUNTER — Ambulatory Visit (INDEPENDENT_AMBULATORY_CARE_PROVIDER_SITE_OTHER): Payer: 59 | Admitting: Family Medicine

## 2013-07-27 VITALS — BP 150/100 | HR 101 | Temp 98.6°F | Resp 18 | Ht 70.5 in | Wt 186.8 lb

## 2013-07-27 DIAGNOSIS — R0602 Shortness of breath: Secondary | ICD-10-CM

## 2013-07-27 DIAGNOSIS — R059 Cough, unspecified: Secondary | ICD-10-CM

## 2013-07-27 DIAGNOSIS — I2699 Other pulmonary embolism without acute cor pulmonale: Secondary | ICD-10-CM | POA: Insufficient documentation

## 2013-07-27 DIAGNOSIS — R911 Solitary pulmonary nodule: Secondary | ICD-10-CM | POA: Insufficient documentation

## 2013-07-27 DIAGNOSIS — J9819 Other pulmonary collapse: Secondary | ICD-10-CM | POA: Insufficient documentation

## 2013-07-27 DIAGNOSIS — R05 Cough: Secondary | ICD-10-CM

## 2013-07-27 DIAGNOSIS — I10 Essential (primary) hypertension: Secondary | ICD-10-CM

## 2013-07-27 LAB — CBC
HCT: 45.4 % (ref 39.0–52.0)
Hemoglobin: 16.1 g/dL (ref 13.0–17.0)
MCH: 32.1 pg (ref 26.0–34.0)
MCHC: 35.5 g/dL (ref 30.0–36.0)
MCV: 90.6 fL (ref 78.0–100.0)
PLATELETS: 294 10*3/uL (ref 150–400)
RBC: 5.01 MIL/uL (ref 4.22–5.81)
RDW: 13.3 % (ref 11.5–15.5)
WBC: 6.9 10*3/uL (ref 4.0–10.5)

## 2013-07-27 LAB — POCT I-STAT CREATININE: Creatinine, Ser: 1.2 mg/dL (ref 0.50–1.35)

## 2013-07-27 LAB — D-DIMER, QUANTITATIVE: D-Dimer, Quant: 0.84 ug/mL-FEU — ABNORMAL HIGH (ref 0.00–0.48)

## 2013-07-27 LAB — TROPONIN I: TROPONIN I: 0.02 ng/mL (ref <0.06)

## 2013-07-27 MED ORDER — IOHEXOL 350 MG/ML SOLN
100.0000 mL | Freq: Once | INTRAVENOUS | Status: AC | PRN
  Administered 2013-07-27: 100 mL via INTRAVENOUS

## 2013-07-27 MED ORDER — DOXYCYCLINE HYCLATE 100 MG PO TABS: 100.0000 mg | ORAL_TABLET | Freq: Two times a day (BID) | ORAL | Status: DC

## 2013-07-27 NOTE — Patient Instructions (Signed)
You can go ahead and start taking the doxycycline to cover you for pneumonia and any tick borne illness.  I will give you a call later on today with your STAT labs.

## 2013-07-27 NOTE — Progress Notes (Addendum)
[facility] [address], [place] [address] [phone]  Date:  07/27/2013   Name:  Joel Black   DOB:  02/19/1955   MRN:  [id number]  PCP:  No PCP Per Patient    Chief Complaint: Insect Bite, Fatigue, Shortness of Breath and Cough   History of Present Illness:  Joel Black is a 58 y.o. very pleasant male patient who presents with the following:  Here today with illness. He is worried because he had a tick on his leg in April.  The tick was not swollen, but did leave a mark on his leg that took 1.5 months to heal.  He wonders if this might be related to his current sx: notes that for a couple of weeks he has felt fatigued, is SOB, and has a cough productive of mucus.   He has not noted a fever.  No chest pain at rest or with exercise.   He is taking his losartan like usual.   No GI symptoms.   He will want to take a nap in the afternoon, and is sleeping more than usual.   No cardiac history except for HTN.  He is not sure if he ever had a stress test.  No family history of heart problems.  No headache  Married, quit smoking a long time ago  Patient Active Problem List   Diagnosis Date Noted  . HTN (hypertension) 10/21/2012    Past Medical History  Diagnosis Date  . Herniated cervical disc   . Ulcer   . Hypertension     History reviewed. No pertinent past surgical history.  History  Substance Use Topics  . Smoking status: Former Smoker    Types: Cigarettes    Quit date: 06/11/1986  . Smokeless tobacco: Not on file  . Alcohol Use: Yes     Comment: 2 glasses with dinner    Family History  Problem Relation Age of Onset  . Cancer Father     lung  . Diabetes Sister     No Known Allergies  Medication list has been reviewed and updated.  Current Outpatient Prescriptions on File Prior to Visit  Medication Sig Dispense Refill  . losartan (COZAAR) 50 MG tablet Take 1 tablet (50 mg total) by mouth 2 (two) times daily.   180 tablet  1  . Omega-3 Fatty Acids (FISH OIL PO) Take by mouth.       No current facility-administered medications on file prior to visit.    Review of Systems:  As per HPI- otherwise negative.   Physical Examination: Filed Vitals:   07/27/13 1151  BP: 180/110  Pulse: 101  Temp: 98.6 F (37 C)  Resp: 18   Filed Vitals:   07/27/13 1151  Height: 5' 10.5" (1.791 m)  Weight: 186 lb 12.8 oz (84.732 kg)   Body mass index is 26.42 kg/(m^2). Ideal Body Weight: Weight in (lb) to have BMI = 25: 176.4  GEN: WDWN, NAD, Non-toxic, A & O x 3, looks well HEENT: Atraumatic, Normocephalic. Neck supple. No masses, No LAD.  Bilateral TM wnl, oropharynx normal.  PEERL,EOMI.   Ears and Nose: No external deformity. CV: RRR, No M/G/R. No JVD. No thrill. No extra heart sounds. PULM: CTA B, no wheezes, crackles, rhonchi. No retractions. No resp. distress. No accessory muscle use. ABD: S, NT, ND. No rebound. No HSM. EXTR: No c/c/e NEURO Normal gait.  PSYCH: Normally interactive. Conversant. Not depressed or anxious appearing.  Calm demeanor.  There is a faint area of hyperpigmentation on the left distal thigh- this is his healed tick bite mark.   No LE edema, no calf tenderness or swelling   EKG: SR, rate 90.  Minimal to no ST depression. Machine read as non- specific ST depression.  Compared to EKG from last year- no significant change   [facility] reading (PRIMARY) by  Dr. Patsy Lager. CXR:  No effusion. Possible minimal infiltrate retrocardiac  EXAM: CHEST 2 VIEW  COMPARISON: 12/08/2007.  FINDINGS: The lungs are clear without focal infiltrate, edema, pneumothorax or pleural effusion. Hyperexpansion suggests emphysema. The cardiopericardial silhouette is within normal limits for size. Imaged bony structures of the thorax are intact.  IMPRESSION: Stable. No acute cardiopulmonary findings  Assessment and Plan: SOB (shortness of breath) - Plan: doxycycline (VIBRA-TABS) 100 MG tablet,  D-dimer, quantitative, Troponin I, CT Angio Chest W/Cm &/Or Wo Cm, BUN+Creat  Accelerated hypertension - Plan: EKG 12-Lead, Troponin I, BUN+Creat  Cough - Plan: DG Chest 2 View, doxycycline (VIBRA-TABS) 100 MG tablet, CBC, Comprehensive metabolic panel, BUN+Creat  Joel Black is here today with SOB, elevated BP, and transient tachycardia.  His lungs are clear and EKG is non- acute. He does not have any CP.  Discussed with him in detail. Consider possibility of PE; will send out a D dimer.  Also troponin I due to elevated BP and possible abnormality of EKG, although he does not have any CP.  Await these results and call him.   Signed Abbe Amsterdam, MD  Received his stat labs:  Results for orders placed in visit on 07/27/13  CBC      Result Value Ref Range   WBC 6.9  4.0 - 10.5 K/uL   RBC 5.01  4.22 - 5.81 MIL/uL   Hemoglobin 16.1  13.0 - 17.0 g/dL   HCT 16.1  09.6 - 04.5 %   MCV 90.6  78.0 - 100.0 fL   MCH 32.1  26.0 - 34.0 pg   MCHC 35.5  30.0 - 36.0 g/dL   RDW 40.9  81.1 - 91.4 %   Platelets 294  150 - 400 K/uL  D-DIMER, QUANTITATIVE      Result Value Ref Range   D-Dimer, Quant 0.84 (*) 0.00 - 0.48 ug/mL-FEU  TROPONIN I      Result Value Ref Range   Troponin I 0.02  <0.06 ng/mL   D dimer is up, needs a CT angio.  Called cell number- no answer and MB full.   Called back later and reached him, arranged CT angio for this evening.    Received CT results- no PE.  Called and discussed with his wife who answered phone.  Continue doxycycline and I will give them a call tomorrow to check on him.    Called 7/10:  Discussed with his wife who answered. She reports that Joel Black is feeling better today, he will complete course of abx.  Recommended cardiology referral for possible stress test and she is ok with this plan

## 2013-07-28 LAB — COMPREHENSIVE METABOLIC PANEL
ALBUMIN: 4.5 g/dL (ref 3.5–5.2)
ALK PHOS: 66 U/L (ref 39–117)
ALT: 25 U/L (ref 0–53)
AST: 20 U/L (ref 0–37)
BILIRUBIN TOTAL: 0.7 mg/dL (ref 0.2–1.2)
BUN: 16 mg/dL (ref 6–23)
CO2: 27 mEq/L (ref 19–32)
Calcium: 9.8 mg/dL (ref 8.4–10.5)
Chloride: 104 mEq/L (ref 96–112)
Creat: 0.99 mg/dL (ref 0.50–1.35)
Glucose, Bld: 110 mg/dL — ABNORMAL HIGH (ref 70–99)
POTASSIUM: 3.9 meq/L (ref 3.5–5.3)
SODIUM: 139 meq/L (ref 135–145)
TOTAL PROTEIN: 7.2 g/dL (ref 6.0–8.3)

## 2013-07-28 NOTE — Addendum Note (Signed)
Addended by: Abbe AmsterdamOPLAND, JESSICA C on: 07/28/2013 12:49 PM   Modules accepted: Orders

## 2013-07-31 ENCOUNTER — Telehealth: Payer: Self-pay

## 2013-07-31 DIAGNOSIS — R911 Solitary pulmonary nodule: Secondary | ICD-10-CM

## 2013-07-31 DIAGNOSIS — R0602 Shortness of breath: Secondary | ICD-10-CM

## 2013-07-31 NOTE — Telephone Encounter (Signed)
PT WOULD LIKE DR COPLAND TO KNOW HE WANTED TO GO AHEAD AND HAVE THE STRESS DONE. PLEASE CALL [phone]

## 2013-07-31 NOTE — Telephone Encounter (Signed)
Pt wants dr copland to know that her is ready to go thru with the stress test referral   Best number [phone]

## 2013-07-31 NOTE — Telephone Encounter (Signed)
Called and was able to get an appt with Dr. Jacinto HalimGanji tomorrow 7/14 at 11:30.  Called and discussed with his wife.

## 2013-07-31 NOTE — Telephone Encounter (Signed)
Referral has been entered. Pt advised waiting for Cardiology to schedule exam.

## 2013-08-01 ENCOUNTER — Telehealth: Payer: Self-pay | Admitting: Family Medicine

## 2013-08-01 NOTE — Telephone Encounter (Signed)
[facility]

## 2013-08-01 NOTE — Telephone Encounter (Signed)
Called [facility] per Dr. Cyndie Chimeopland's request and canceled Renae FicklePaul Melching's appointment that was scheduled in august due to the fact of him being seen on today by another cardiology doctor.

## 2013-08-01 NOTE — Telephone Encounter (Signed)
Called and discussed with him.  He already saw Ganji this am, they adjusted his medications and plan to do some follow-up testing.  He feels reassured.  Reminded him that we need a follow-up CT in about 3 months for his lung nodule, will go ahead and get this scheduled.

## 2013-08-03 NOTE — Addendum Note (Signed)
Addended by: Abbe AmsterdamOPLAND, Toneisha Savary C on: 08/03/2013 10:10 AM   Modules accepted: Orders

## 2013-08-14 NOTE — Telephone Encounter (Signed)
Message for Dr. Patsy Lageropland regarding Cat scan, he says his insurance will not cover it, and he does not have the money to pay for it, Pt says he does not know what to do, he can still feel fluid in lungs when he breaths and coughs, please advice pt

## 2013-08-15 ENCOUNTER — Other Ambulatory Visit: Payer: 59

## 2013-08-15 DIAGNOSIS — Z0271 Encounter for disability determination: Secondary | ICD-10-CM

## 2013-08-16 NOTE — Telephone Encounter (Signed)
Called and spoke with him.   He is actually ok, just still coughing some.  explained that he is not supposed to have his follow-up CT for 3 months anyway; he was called to do the CT this week but did not go due to financial concerns which is a good thing as it is too early.  He is relieved to hear this and thinks he will be able to do the CT in 3 months.  I will reorder it for him and try to find where error occurred

## 2013-08-16 NOTE — Telephone Encounter (Signed)
Please advise 

## 2013-08-18 NOTE — Telephone Encounter (Signed)
Spoke with Lupita LeashDonna and cleared up issue with date of CT- will set up for October of this year

## 2013-08-29 ENCOUNTER — Encounter: Payer: Self-pay | Admitting: Family Medicine

## 2013-08-31 ENCOUNTER — Telehealth: Payer: Self-pay

## 2013-08-31 NOTE — Telephone Encounter (Signed)
Dr.Copland, Pt states that is having a hard time trying to catch his breath lately, he states that he was seen recently for pneumonia and was prescribed doxycycline, he would like to know if he could have a refill on this.   Best#[phone]

## 2013-09-01 ENCOUNTER — Ambulatory Visit (INDEPENDENT_AMBULATORY_CARE_PROVIDER_SITE_OTHER): Payer: 59 | Admitting: Family Medicine

## 2013-09-01 ENCOUNTER — Ambulatory Visit (INDEPENDENT_AMBULATORY_CARE_PROVIDER_SITE_OTHER): Payer: 59

## 2013-09-01 VITALS — BP 112/74 | HR 92 | Temp 97.4°F | Resp 16 | Ht 71.0 in | Wt 185.8 lb

## 2013-09-01 DIAGNOSIS — R0789 Other chest pain: Secondary | ICD-10-CM

## 2013-09-01 MED ORDER — ALBUTEROL SULFATE HFA 108 (90 BASE) MCG/ACT IN AERS: 2.0000 | INHALATION_SPRAY | Freq: Four times a day (QID) | RESPIRATORY_TRACT | Status: DC | PRN

## 2013-09-01 MED ORDER — PREDNISONE 20 MG PO TABS: ORAL_TABLET | ORAL | Status: DC

## 2013-09-01 MED ORDER — ALBUTEROL SULFATE (2.5 MG/3ML) 0.083% IN NEBU
2.5000 mg | INHALATION_SOLUTION | Freq: Once | RESPIRATORY_TRACT | Status: AC
  Administered 2013-09-01: 2.5 mg via RESPIRATORY_TRACT

## 2013-09-01 NOTE — Patient Instructions (Addendum)
Use the albuterol as needed for chest tightness. Also try the steroid (prednisone) for 6 days as directed.  Let me know if your symptoms do not get better- Sooner if worse.   You should have your repeat CT done in about 2 months- let me know if you do not hear about this test.  You did fine on your breathing test today.

## 2013-09-01 NOTE — Progress Notes (Signed)
[facility] [address] [place] [address] [phone]  Date:  09/01/2013   Name:  Joel Black   DOB:  04-Jun-1955   MRN:  [id number]  PCP:  No PCP Per Patient    Chief Complaint: Breathing Problem and other   History of Present Illness:  Joel Black is a 58 y.o. very pleasant male patient who presents with the following:  He was here about on month ago with SOB- noted to have a high D dimer and was sent for a CT angiogram: IMPRESSION:  No evidence of significant pulmonary embolus. Dependent atelectasis  in the lung bases. No focal consolidation. 9 mm ovoid nodule in the  right middle lung. Followup in 3 months is recommended.  We plan to do a follow-up CT as above.    He is here today because he feels "more out of breath and like there is an obstruction in my lungs every time I breathe in." This is similar to what brought him in in the first place.  He felt better when he was taking doxy, but once he finished the doxycycline the sx came back.   He is coughing up mucus. He feels congested in his lungs.  He has not noted wheezing.   He has not noted a fever.    He has seen Dr. Jacinto Halim a couple of weeks ago.  He had an echocardiogram and plans to continue to follow- up as directed.  He is not having any typical CP Patient Active Problem List   Diagnosis Date Noted  . HTN (hypertension) 10/21/2012    Past Medical History  Diagnosis Date  . Herniated cervical disc   . Ulcer   . Hypertension     History reviewed. No pertinent past surgical history.  History  Substance Use Topics  . Smoking status: Former Smoker    Types: Cigarettes    Quit date: 06/11/1986  . Smokeless tobacco: Not on file  . Alcohol Use: Yes     Comment: 2 glasses with dinner    Family History  Problem Relation Age of Onset  . Cancer Father     lung  . Diabetes Sister     No Known Allergies  Medication list has been reviewed and updated.  Current  Outpatient Prescriptions on File Prior to Visit  Medication Sig Dispense Refill  . doxycycline (VIBRA-TABS) 100 MG tablet Take 1 tablet (100 mg total) by mouth 2 (two) times daily.  20 tablet  0  . Omega-3 Fatty Acids (FISH OIL PO) Take by mouth.      . losartan (COZAAR) 50 MG tablet Take 1 tablet (50 mg total) by mouth 2 (two) times daily.  180 tablet  1   No current facility-administered medications on file prior to visit.    Review of Systems:  As per HPI- otherwise negative.   Physical Examination: Filed Vitals:   09/01/13 1136  BP: 112/74  Pulse: 92  Temp: 97.4 F (36.3 C)  Resp: 16   Filed Vitals:   09/01/13 1136  Height: 5\' 11"  (1.803 m)  Weight: 185 lb 12.8 oz (84.278 kg)   Body mass index is 25.93 kg/(m^2). Ideal Body Weight: Weight in (lb) to have BMI = 25: 178.9  GEN: WDWN, NAD, Non-toxic, A & O x 3, looks well HEENT: Atraumatic, Normocephalic. Neck supple. No masses, No LAD.  Bilateral TM wnl, oropharynx normal.  PEERL,EOMI.   Ears and Nose: No external deformity. CV: RRR,  No M/G/R. No JVD. No thrill. No extra heart sounds. PULM: CTA B, no wheezes, crackles, rhonchi. No retractions. No resp. distress. No accessory muscle use. EXTR: No c/c/e NEURO Normal gait.  PSYCH: Normally interactive. Conversant. Not depressed or anxious appearing.  Calm demeanor.   [facility] reading (PRIMARY) by  Dr. Patsy Lageropland. CXR: negative  CHEST 2 VIEW  COMPARISON: CT scan of the chest and PA and lateral chest x-ray of July 27, 2013.  FINDINGS: The lungs are mildly hyperinflated with hemidiaphragm flattening. There is no focal infiltrate. The heart and mediastinal structures are normal. The pulmonary vascularity is not engorged. There is mild apical pleural thickening on the right. There is no pleural effusion or pneumothorax or pneumomediastinum. The bony thorax exhibits no acute abnormality.  IMPRESSION: COPD. There is no evidence of pneumonia nor other acute cardiopulmonary  abnormality.  Albuterol neb treatment: he felt much better.  "I can take a deep breath!"   Spirometry- normal  Assessment and Plan: Chest tightness - Plan: DG Chest 2 View, albuterol (PROVENTIL) (2.5 MG/3ML) 0.083% nebulizer solution 2.5 mg, albuterol (PROVENTIL HFA;VENTOLIN HFA) 108 (90 BASE) MCG/ACT inhaler, predniSONE (DELTASONE) 20 MG tablet  Chest tightness and sx of RAD, likely residual from recent illness.  He felt much better after albuterol neb.  Will tx with albuterol prn, prednisone.  Although he has some hyperinflation on his CXR his spirometry is normal.   See patient instructions for more details.     Signed Abbe AmsterdamJessica Copland, MD

## 2013-09-01 NOTE — Telephone Encounter (Signed)
Pt needs to RTC and just checked in to be seen.

## 2013-09-01 NOTE — Telephone Encounter (Signed)
PT's wife called back and is now requesting an inhaler in addition to the antibiotic refill.

## 2013-09-05 ENCOUNTER — Institutional Professional Consult (permissible substitution): Payer: 59 | Admitting: Cardiology

## 2013-09-14 DIAGNOSIS — Z0271 Encounter for disability determination: Secondary | ICD-10-CM

## 2013-09-22 DIAGNOSIS — Z0271 Encounter for disability determination: Secondary | ICD-10-CM

## 2013-11-22 ENCOUNTER — Encounter: Payer: Self-pay | Admitting: Family Medicine

## 2014-02-02 ENCOUNTER — Other Ambulatory Visit: Payer: 59

## 2014-02-09 ENCOUNTER — Other Ambulatory Visit: Payer: Self-pay

## 2014-02-14 ENCOUNTER — Ambulatory Visit
Admission: RE | Admit: 2014-02-14 | Discharge: 2014-02-14 | Disposition: A | Discharge status: Home / Self Care | Payer: Commercial Managed Care - PPO | Source: Ambulatory Visit | Attending: Family Medicine | Admitting: Family Medicine

## 2014-02-14 DIAGNOSIS — R911 Solitary pulmonary nodule: Secondary | ICD-10-CM

## 2014-02-18 ENCOUNTER — Encounter: Payer: Self-pay | Admitting: Family Medicine

## 2014-02-18 ENCOUNTER — Other Ambulatory Visit: Payer: Self-pay | Admitting: Family Medicine

## 2014-02-18 DIAGNOSIS — R911 Solitary pulmonary nodule: Secondary | ICD-10-CM

## 2014-03-19 ENCOUNTER — Encounter: Payer: Self-pay | Admitting: Family Medicine

## 2014-03-19 DIAGNOSIS — R0602 Shortness of breath: Secondary | ICD-10-CM | POA: Insufficient documentation

## 2014-03-28 ENCOUNTER — Encounter: Payer: Self-pay | Admitting: Family Medicine

## 2014-10-19 IMAGING — CT CT HEAD W/O CM
2 series · 17 of 30 positions shown, 20 images · non-contrast
Comparison: None.

CLINICAL DATA: Headache, dizziness and unsteady gait.

CT HEAD WITHOUT CONTRAST
TECHNIQUE: Contiguous axial images were obtained from the base of
the skull through the vertex without contrast.

[Series 2: head w/o · axial · non-contrast · 0.43mm/px · z∈[+1391,+1511]mm · 9 of 31 slices shown, 12 images]
[im 4/31  brain]
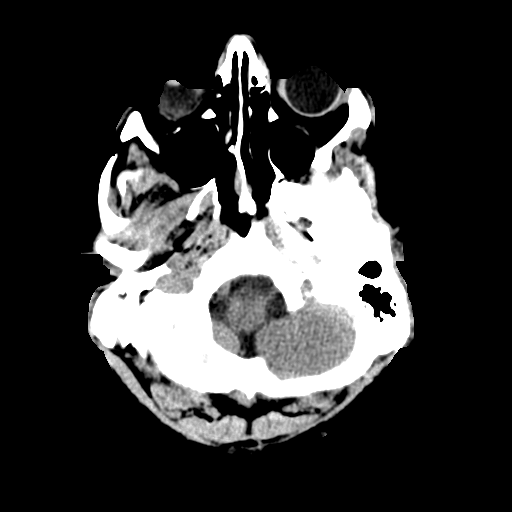
[im 4/31  bone]
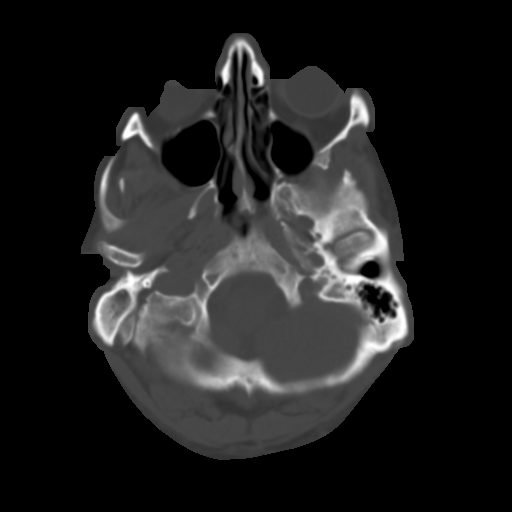
[im 7/31  brain]
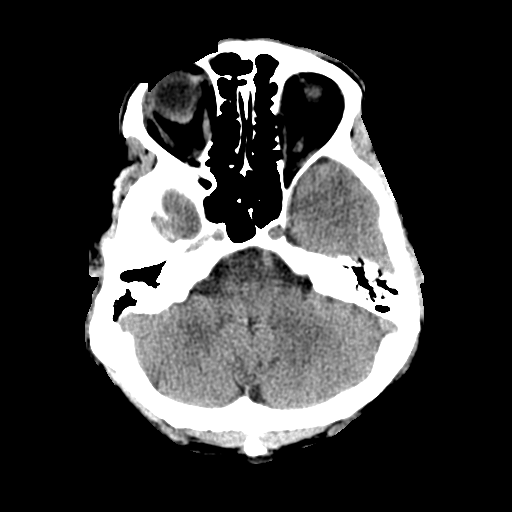
[im 10/31  brain]
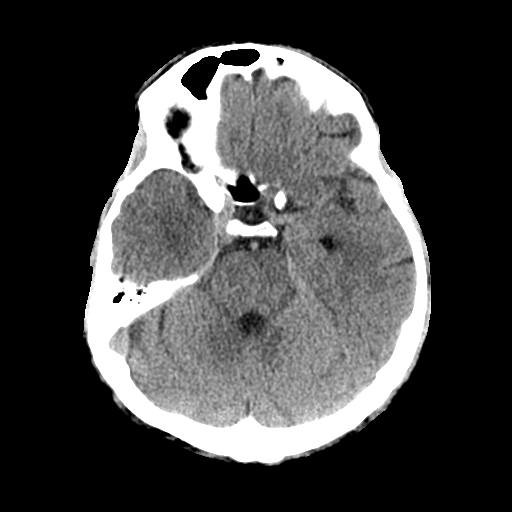
[im 13/31  brain]
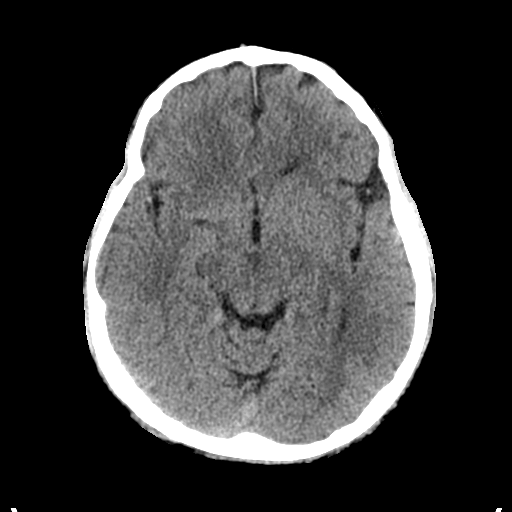
[im 16/31  brain]
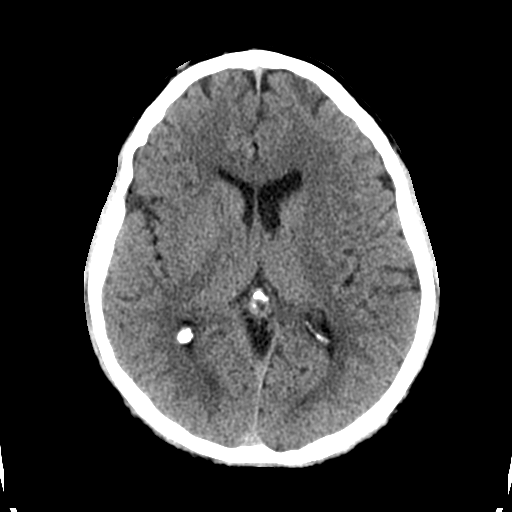
[im 16/31  bone]
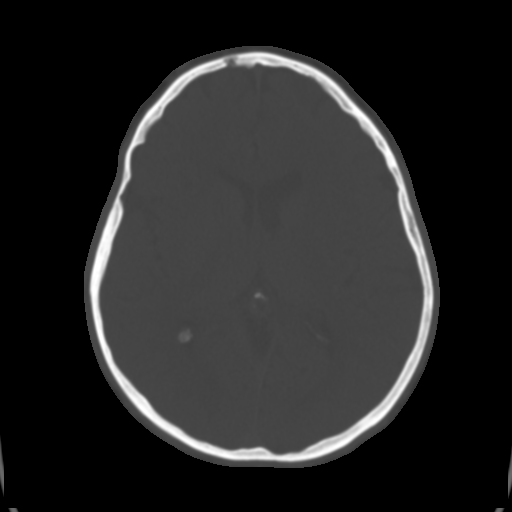
[im 19/31  brain]
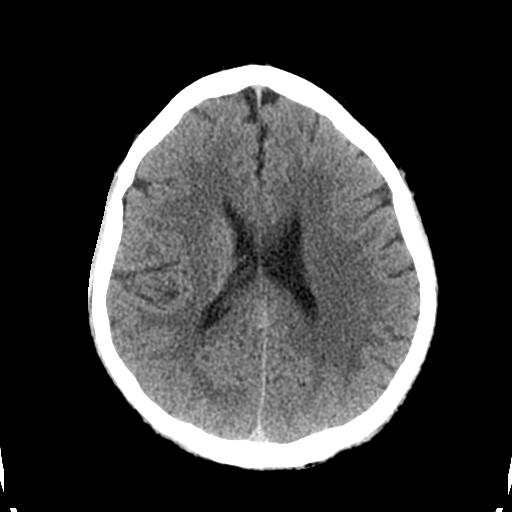
[im 22/31  brain]
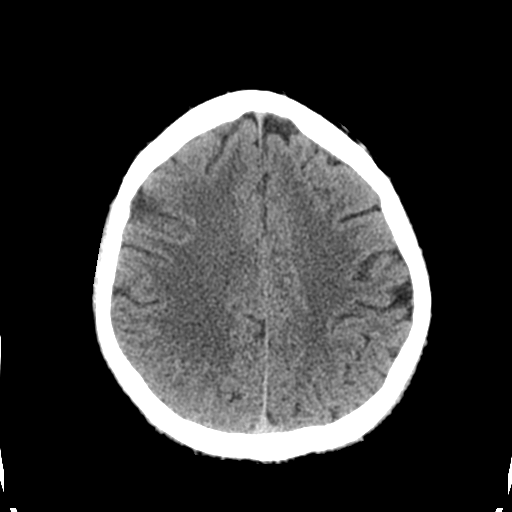
[im 25/31  brain]
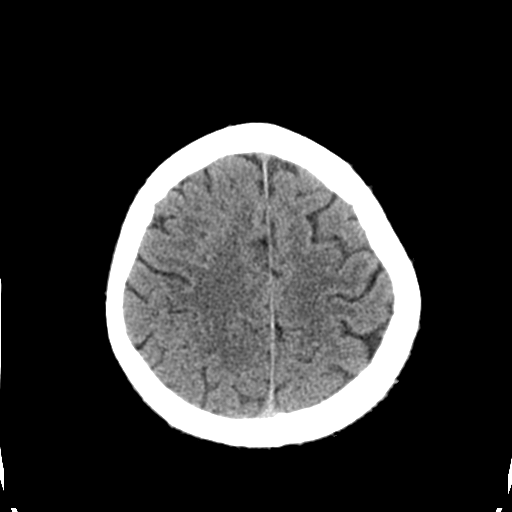
[im 28/31  brain]
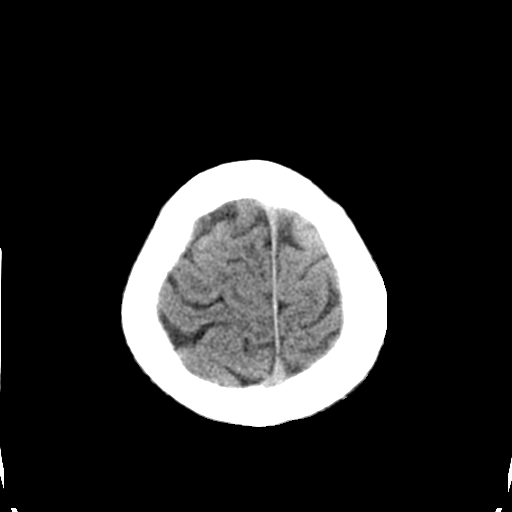
[im 28/31  bone]
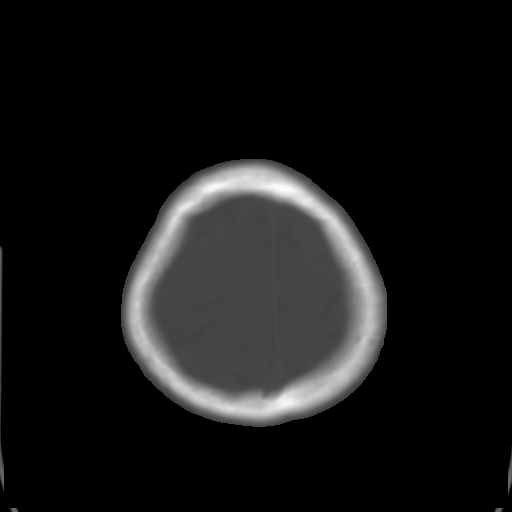

[Series 3: bone windows · axial · 0.43mm/px · z∈[+1391,+1511]mm · 8 of 52 slices shown]
[im 6/52  bone]
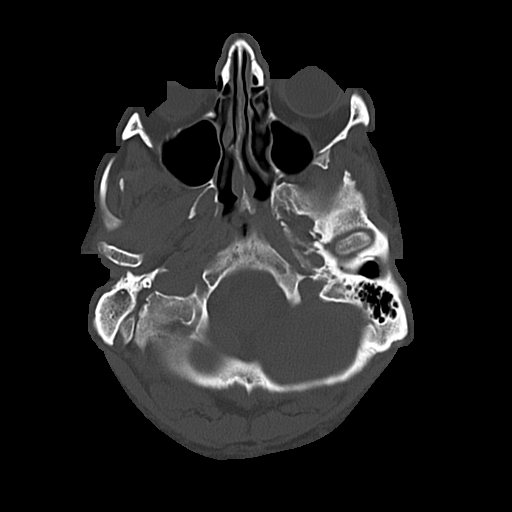
[im 12/52  bone]
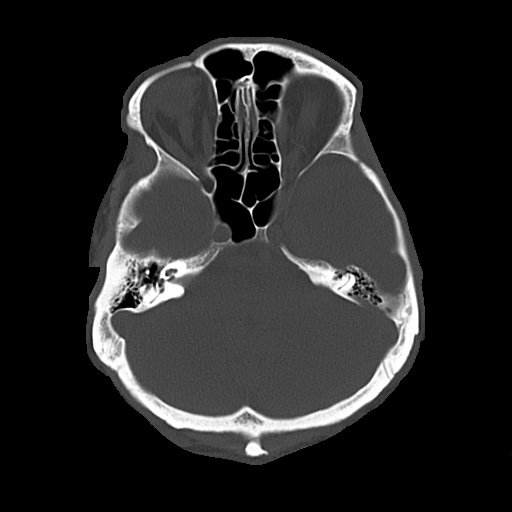
[im 18/52  bone]
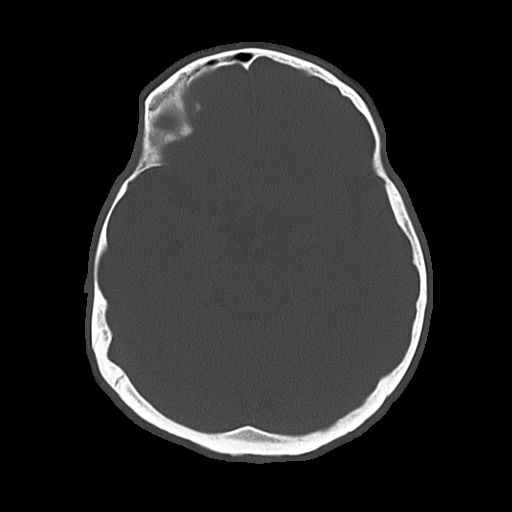
[im 23/52  bone]
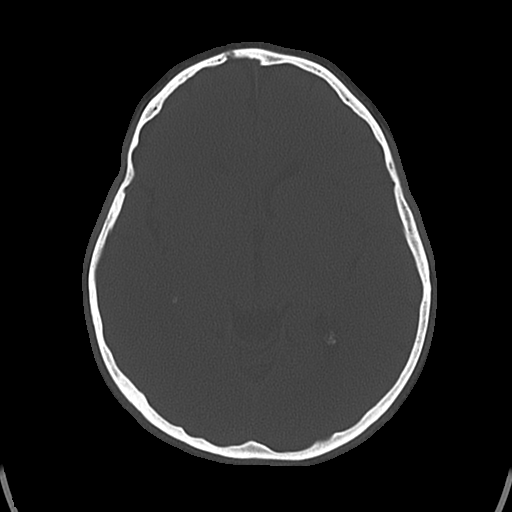
[im 29/52  bone]
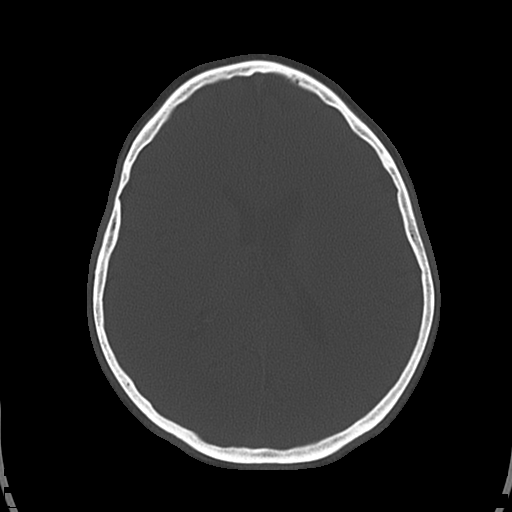
[im 35/52  bone]
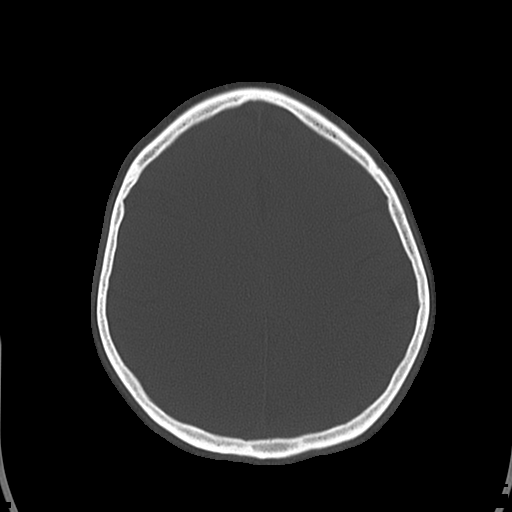
[im 40/52  bone]
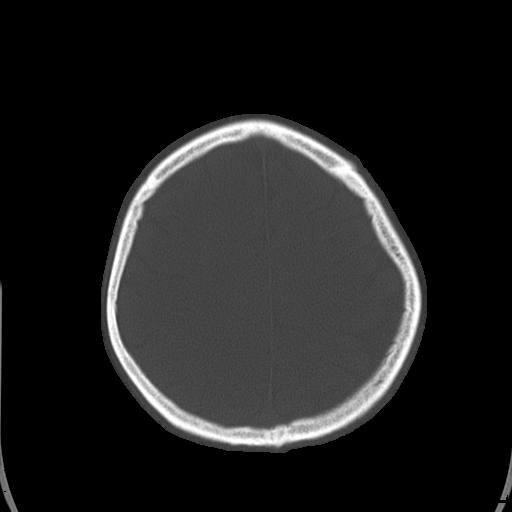
[im 46/52  bone]
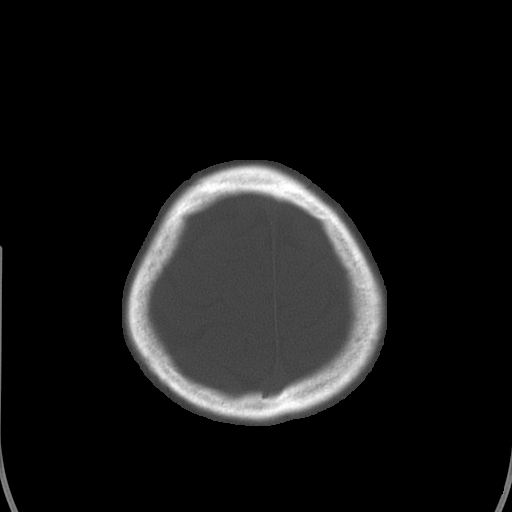

[17 of 30 positions shown; findings below may reference images not displayed]

FINDINGS: The brain demonstrates no evidence of hemorrhage,
infarction, edema, mass effect, extra-axial fluid collection,
hydrocephalus or mass lesion.  The skull is unremarkable.
IMPRESSION: Normal head CT.

## 2015-02-13 ENCOUNTER — Ambulatory Visit (INDEPENDENT_AMBULATORY_CARE_PROVIDER_SITE_OTHER): Payer: Commercial Managed Care - PPO | Admitting: Family Medicine

## 2015-02-13 ENCOUNTER — Encounter: Payer: Self-pay | Admitting: Family Medicine

## 2015-02-13 VITALS — BP 150/90 | HR 90 | Temp 97.9°F | Resp 16 | Wt 185.0 lb

## 2015-02-13 DIAGNOSIS — R200 Anesthesia of skin: Secondary | ICD-10-CM

## 2015-02-13 DIAGNOSIS — Z125 Encounter for screening for malignant neoplasm of prostate: Secondary | ICD-10-CM | POA: Diagnosis not present

## 2015-02-13 DIAGNOSIS — R208 Other disturbances of skin sensation: Secondary | ICD-10-CM | POA: Diagnosis not present

## 2015-02-13 DIAGNOSIS — Z1211 Encounter for screening for malignant neoplasm of colon: Secondary | ICD-10-CM | POA: Diagnosis not present

## 2015-02-13 DIAGNOSIS — M4802 Spinal stenosis, cervical region: Secondary | ICD-10-CM | POA: Diagnosis not present

## 2015-02-13 DIAGNOSIS — I10 Essential (primary) hypertension: Secondary | ICD-10-CM | POA: Diagnosis not present

## 2015-02-13 DIAGNOSIS — Z13220 Encounter for screening for lipoid disorders: Secondary | ICD-10-CM

## 2015-02-13 DIAGNOSIS — Z130 Encounter for screening for diseases of the blood and blood-forming organs and certain disorders involving the immune mechanism: Secondary | ICD-10-CM | POA: Diagnosis not present

## 2015-02-13 DIAGNOSIS — Z131 Encounter for screening for diabetes mellitus: Secondary | ICD-10-CM | POA: Diagnosis not present

## 2015-02-13 DIAGNOSIS — Z23 Encounter for immunization: Secondary | ICD-10-CM

## 2015-02-13 DIAGNOSIS — R911 Solitary pulmonary nodule: Secondary | ICD-10-CM

## 2015-02-13 DIAGNOSIS — Z119 Encounter for screening for infectious and parasitic diseases, unspecified: Secondary | ICD-10-CM | POA: Diagnosis not present

## 2015-02-13 LAB — COMPREHENSIVE METABOLIC PANEL
ALT: 25 U/L (ref 9–46)
AST: 20 U/L (ref 10–35)
Albumin: 4.2 g/dL (ref 3.6–5.1)
Alkaline Phosphatase: 56 U/L (ref 40–115)
BUN: 20 mg/dL (ref 7–25)
CHLORIDE: 103 mmol/L (ref 98–110)
CO2: 25 mmol/L (ref 20–31)
Calcium: 9.1 mg/dL (ref 8.6–10.3)
Creat: 0.97 mg/dL (ref 0.70–1.25)
Glucose, Bld: 93 mg/dL (ref 65–99)
Potassium: 3.9 mmol/L (ref 3.5–5.3)
Sodium: 138 mmol/L (ref 135–146)
Total Bilirubin: 0.5 mg/dL (ref 0.2–1.2)
Total Protein: 6.9 g/dL (ref 6.1–8.1)

## 2015-02-13 LAB — CBC
HCT: 44.7 % (ref 39.0–52.0)
Hemoglobin: 15.7 g/dL (ref 13.0–17.0)
MCH: 32.6 pg (ref 26.0–34.0)
MCHC: 35.1 g/dL (ref 30.0–36.0)
MCV: 92.9 fL (ref 78.0–100.0)
MPV: 10.8 fL (ref 8.6–12.4)
PLATELETS: 273 10*3/uL (ref 150–400)
RBC: 4.81 MIL/uL (ref 4.22–5.81)
RDW: 13.2 % (ref 11.5–15.5)
WBC: 5.2 10*3/uL (ref 4.0–10.5)

## 2015-02-13 LAB — HEMOGLOBIN A1C
Hgb A1c MFr Bld: 5.4 % (ref <5.7)
MEAN PLASMA GLUCOSE: 108 mg/dL (ref <117)

## 2015-02-13 LAB — HEPATITIS C ANTIBODY: HCV AB: NEGATIVE

## 2015-02-13 LAB — LDL CHOLESTEROL, DIRECT: LDL DIRECT: 157 mg/dL — AB (ref <130)

## 2015-02-13 MED ORDER — PREDNISONE 20 MG PO TABS: ORAL_TABLET | ORAL | Status: DC

## 2015-02-13 MED ORDER — AMLODIPINE BESYLATE 5 MG PO TABS: 5.0000 mg | ORAL_TABLET | Freq: Every day | ORAL | Status: DC

## 2015-02-13 MED ORDER — VALSARTAN-HYDROCHLOROTHIAZIDE 320-12.5 MG PO TABS: 1.0000 | ORAL_TABLET | Freq: Every day | ORAL | Status: DC

## 2015-02-13 NOTE — Patient Instructions (Addendum)
It was great to see you today- I will be in touch with your labs asap Please have your wife keep an eye on your blood pressure- if you are running higher than 140/90 please let me know.  In that case I would like to have you start back on  of amlodpine.   Please give me an update regarding your blood pressure in a few weeks  We will arrange for you to see GI to have a colonoscopy for colon cancer screening  You got your tdap shot today- tetanus and whooping cough booster  I suspect that you have some nerve pinching causing the left arm tingling.  Use the prednisone for 10 days as directed- however wait 2-3 days following the tetanus shot today so we can make sure the shot works well for you If your neck becomes problematic enough we can have you follow-up with Dr. Venetia Maxon   Plan follow-up about 6 months.    It would be my pleasure to continue to see you as a patient at my new office, starting the last week of February.  If you prefer to remain at [facility] one of my partners will be happy to see you here.   [facility] [address] [place], [place] [address] Phone: [phone]

## 2015-02-13 NOTE — Progress Notes (Signed)
[facility] [address], [place] [address] [phone]  Date:  02/13/2015   Name:  Joel Black   DOB:  11-15-1955   MRN:  [id number]  PCP:  No PCP Per Patient    Chief Complaint: Hypertension   History of Present Illness:  Joel Black is a 60 y.o. very pleasant male patient who presents with the following:  Here today to follow-up on HTN. He was last seen her in 2015 with SOB, had a CT of his chest and saw cardiology.  ETT 02/2014 was negative.  He did have a likely benign nodule in his chest and missed his 6 month follow-up CT.   BP Readings from Last 3 Encounters:  02/13/15 150/90  09/01/13 112/74  07/27/13 150/100   He is on volsartan/ hctz 320/12.5.  He had run out for a while and just started back on this a week or so ago. Not taking amlodipine- it is unclear why he stopped taking this but he would be ok with going back on if needed At home he does check his BP on occasion.  Has not been checking it much recently   He declines a flu shot today He is uncertain about his last tetanus shot and would like to do this today  He is doing a sculpture right now [place]  He has had some history of neck trouble and has noted some tingling in the left arm off an on.  It occurs when sitting and may happen 2-3x a day. He is able to resolve the sensation by repositioning his head and neck.  He does have some cervical spine disease as per MRI 11/2012  IMPRESSION: 1. Similar appearance of moderate to severe right and moderate left foraminal stenosis at C6-7, the most severe right-sided disease. 2. Moderate to severe left and moderate right foraminal stenosis at C5-6 has slightly progressed since the prior exam. 3. Stable mild central and bilateral foraminal stenosis at C3-4. 4. Stable central and right greater than left foraminal stenosis at C4-5.  He did see NSG- Dr. Venetia Maxon- about his neck. However he was  able to resolve his sx by using the services of a "hands on healer" and so did not end up needing to have anything done at that time time.  Patient Active Problem List   Diagnosis Date Noted  . Shortness of breath 03/19/2014  . Solitary pulmonary nodule on lung CT 02/18/2014  . HTN (hypertension) 10/21/2012    Past Medical History  Diagnosis Date  . Herniated cervical disc   . Ulcer   . Hypertension     No past surgical history on file.  Social History  Substance Use Topics  . Smoking status: Former Smoker    Types: Cigarettes    Quit date: 06/11/1986  . Smokeless tobacco: None  . Alcohol Use: Yes     Comment: 2 glasses with dinner    Family History  Problem Relation Age of Onset  . Cancer Father     lung  . Diabetes Sister     No Active Allergies  Medication list has been reviewed and updated.  Current Outpatient Prescriptions on File Prior to Visit  Medication Sig Dispense Refill  . losartan (COZAAR) 50 MG tablet Take 1 tablet (50 mg total) by mouth 2 (two) times daily. 180 tablet 1  . valsartan-hydrochlorothiazide (DIOVAN-HCT) 320-12.5 MG per tablet Take 1 tablet by mouth daily.    Marland Kitchen  AMLODIPINE BESYLATE PO Take 5 mg by mouth at bedtime. Reported on 02/13/2015     No current facility-administered medications on file prior to visit.    Review of Systems:  As per HPI- otherwise negative.   Physical Examination: Filed Vitals:   02/13/15 0900 02/13/15 0905  BP: 160/80 150/90  Pulse: 90   Temp: 97.9 F (36.6 C)   Resp: 16    Filed Vitals:   02/13/15 0900  Weight: 185 lb (83.915 kg)   Body mass index is 25.81 kg/(m^2). Ideal Body Weight:    GEN: WDWN, NAD, Non-toxic, A & O x 3, looks well HEENT: Atraumatic, Normocephalic. Neck supple. No masses, No LAD. Bilateral TM wnl, oropharynx normal.  PEERL,EOMI.   Ears and Nose: No external deformity. CV: RRR, No M/G/R. No JVD. No thrill. No extra heart sounds. PULM: CTA B, no wheezes, crackles, rhonchi. No  retractions. No resp. distress. No accessory muscle use. ABD: S, NT, ND, +BS. No rebound. No HSM. EXTR: No c/c/e NEURO Normal gait.  PSYCH: Normally interactive. Conversant. Not depressed or anxious appearing.  Calm demeanor.  Normal BUE strength, sensation.   He does have reproducible left arm sx with Spurlings manuver   Assessment and Plan: Essential hypertension - Plan: Comprehensive metabolic panel, amLODipine (NORVASC) 5 MG tablet, valsartan-hydrochlorothiazide (DIOVAN-HCT) 320-12.5 MG tablet  Screening for hyperlipidemia - Plan: LDL cholesterol, direct  Screening for diabetes mellitus - Plan: Hemoglobin A1c  Screening for deficiency anemia - Plan: CBC  Screening examination for infectious disease - Plan: Hepatitis C antibody  Arm numbness left - Plan: predniSONE (DELTASONE) 20 MG tablet  Immunization due - Plan: Tdap vaccine greater than or equal to 7yo IM  Screening for prostate cancer - Plan: PSA  Screening for colon cancer - Plan: Ambulatory referral to Gastroenterology  Lung nodule seen on imaging study - Plan: CT Chest Wo Contrast  Here today for a follow-up visit.  He will monitor his BP at home and go back on norvasc if not at goal Screening labs, referral to GI for colonoscopy Neck sx- suspect he is having nerve root compression He will try a course of prednisone.  He will also try using his hand on healer again as this worked in the past.  If not helpful he will see Dr. Venetia Maxon as needed   Signed Abbe Amsterdam, MD

## 2015-02-14 ENCOUNTER — Encounter: Payer: Self-pay | Admitting: Family Medicine

## 2015-02-14 LAB — PSA: PSA: 0.84 ng/mL (ref <=4.00)

## 2015-02-21 ENCOUNTER — Inpatient Hospital Stay: Admission: RE | Admit: 2015-02-21 | Payer: Commercial Managed Care - PPO | Source: Ambulatory Visit

## 2015-04-24 ENCOUNTER — Encounter: Payer: Self-pay | Admitting: Family Medicine

## 2015-09-21 ENCOUNTER — Encounter (HOSPITAL_COMMUNITY): Payer: Self-pay

## 2015-09-21 ENCOUNTER — Emergency Department (HOSPITAL_COMMUNITY): Payer: Commercial Managed Care - PPO

## 2015-09-21 ENCOUNTER — Observation Stay (HOSPITAL_COMMUNITY)
Admission: EM | Admit: 2015-09-21 | Discharge: 2015-09-22 | Disposition: A | Discharge status: Home / Self Care | Payer: Commercial Managed Care - PPO | Attending: Internal Medicine | Admitting: Internal Medicine

## 2015-09-21 DIAGNOSIS — M4802 Spinal stenosis, cervical region: Secondary | ICD-10-CM | POA: Insufficient documentation

## 2015-09-21 DIAGNOSIS — K219 Gastro-esophageal reflux disease without esophagitis: Secondary | ICD-10-CM | POA: Insufficient documentation

## 2015-09-21 DIAGNOSIS — R911 Solitary pulmonary nodule: Secondary | ICD-10-CM | POA: Insufficient documentation

## 2015-09-21 DIAGNOSIS — Z7982 Long term (current) use of aspirin: Secondary | ICD-10-CM | POA: Insufficient documentation

## 2015-09-21 DIAGNOSIS — I10 Essential (primary) hypertension: Secondary | ICD-10-CM | POA: Insufficient documentation

## 2015-09-21 DIAGNOSIS — K625 Hemorrhage of anus and rectum: Secondary | ICD-10-CM | POA: Diagnosis not present

## 2015-09-21 DIAGNOSIS — R252 Cramp and spasm: Secondary | ICD-10-CM | POA: Insufficient documentation

## 2015-09-21 DIAGNOSIS — R0789 Other chest pain: Secondary | ICD-10-CM | POA: Diagnosis not present

## 2015-09-21 DIAGNOSIS — R0602 Shortness of breath: Secondary | ICD-10-CM | POA: Diagnosis not present

## 2015-09-21 DIAGNOSIS — Z87891 Personal history of nicotine dependence: Secondary | ICD-10-CM

## 2015-09-21 LAB — CBC
HCT: 45 % (ref 39.0–52.0)
Hemoglobin: 15.8 g/dL (ref 13.0–17.0)
MCH: 32.7 pg (ref 26.0–34.0)
MCHC: 35.1 g/dL (ref 30.0–36.0)
MCV: 93.2 fL (ref 78.0–100.0)
PLATELETS: 243 10*3/uL (ref 150–400)
RBC: 4.83 MIL/uL (ref 4.22–5.81)
RDW: 12.9 % (ref 11.5–15.5)
WBC: 8.8 10*3/uL (ref 4.0–10.5)

## 2015-09-21 LAB — I-STAT TROPONIN, ED: TROPONIN I, POC: 0 ng/mL (ref 0.00–0.08)

## 2015-09-21 LAB — BASIC METABOLIC PANEL
Anion gap: 8 (ref 5–15)
BUN: 19 mg/dL (ref 6–20)
CALCIUM: 9.4 mg/dL (ref 8.9–10.3)
CHLORIDE: 104 mmol/L (ref 101–111)
CO2: 23 mmol/L (ref 22–32)
CREATININE: 1.01 mg/dL (ref 0.61–1.24)
Glucose, Bld: 105 mg/dL — ABNORMAL HIGH (ref 65–99)
Potassium: 3.8 mmol/L (ref 3.5–5.1)
Sodium: 135 mmol/L (ref 135–145)

## 2015-09-21 LAB — TROPONIN I: Troponin I: <0.03 ng/mL (ref <0.03)

## 2015-09-21 LAB — LIPID PANEL
CHOL/HDL RATIO: 4.6 ratio
CHOLESTEROL: 246 mg/dL — AB (ref 0–200)
HDL: 53 mg/dL (ref >40)
LDL Cholesterol: 157 mg/dL — ABNORMAL HIGH (ref 0–99)
Triglycerides: 181 mg/dL — ABNORMAL HIGH (ref <150)
VLDL: 36 mg/dL (ref 0–40)

## 2015-09-21 LAB — CK: Total CK: 157 U/L (ref 49–397)

## 2015-09-21 LAB — POC OCCULT BLOOD, ED: FECAL OCCULT BLD: POSITIVE — AB

## 2015-09-21 LAB — MRSA PCR SCREENING: MRSA by PCR: POSITIVE — AB

## 2015-09-21 MED ORDER — ACETAMINOPHEN 650 MG RE SUPP: 650.0000 mg | Freq: Four times a day (QID) | RECTAL | Status: DC | PRN

## 2015-09-21 MED ORDER — DICLOFENAC SODIUM 1 % TD GEL
4.0000 g | Freq: Four times a day (QID) | TRANSDERMAL | Status: DC
  Administered 2015-09-21 – 2015-09-22 (×4): 4 g via TOPICAL
  Filled 2015-09-21: qty 100

## 2015-09-21 MED ORDER — THIAMINE HCL 100 MG/ML IJ SOLN: 100.0000 mg | Freq: Every day | INTRAMUSCULAR | Status: DC

## 2015-09-21 MED ORDER — GI COCKTAIL ~~LOC~~
30.0000 mL | Freq: Once | ORAL | Status: AC
  Administered 2015-09-21: 30 mL via ORAL
  Filled 2015-09-21: qty 30

## 2015-09-21 MED ORDER — ACETAMINOPHEN 325 MG PO TABS: 650.0000 mg | ORAL_TABLET | Freq: Four times a day (QID) | ORAL | Status: DC | PRN

## 2015-09-21 MED ORDER — LORAZEPAM 1 MG PO TABS: 1.0000 mg | ORAL_TABLET | Freq: Four times a day (QID) | ORAL | Status: DC | PRN

## 2015-09-21 MED ORDER — HYDROCHLOROTHIAZIDE 12.5 MG PO CAPS
12.5000 mg | ORAL_CAPSULE | Freq: Every day | ORAL | Status: DC
  Administered 2015-09-22: 12.5 mg via ORAL
  Filled 2015-09-21: qty 1

## 2015-09-21 MED ORDER — LORAZEPAM 2 MG/ML IJ SOLN: 1.0000 mg | Freq: Four times a day (QID) | INTRAMUSCULAR | Status: DC | PRN

## 2015-09-21 MED ORDER — FOLIC ACID 1 MG PO TABS
1.0000 mg | ORAL_TABLET | Freq: Every day | ORAL | Status: DC
  Administered 2015-09-21 – 2015-09-22 (×2): 1 mg via ORAL
  Filled 2015-09-21 (×2): qty 1

## 2015-09-21 MED ORDER — ASPIRIN 325 MG PO TABS
325.0000 mg | ORAL_TABLET | Freq: Once | ORAL | Status: AC
  Administered 2015-09-21: 325 mg via ORAL
  Filled 2015-09-21: qty 1

## 2015-09-21 MED ORDER — ADULT MULTIVITAMIN W/MINERALS CH
1.0000 | ORAL_TABLET | Freq: Every day | ORAL | Status: DC
  Administered 2015-09-21 – 2015-09-22 (×2): 1 via ORAL
  Filled 2015-09-21 (×2): qty 1

## 2015-09-21 MED ORDER — VALSARTAN-HYDROCHLOROTHIAZIDE 320-12.5 MG PO TABS: 1.0000 | ORAL_TABLET | Freq: Every day | ORAL | Status: DC

## 2015-09-21 MED ORDER — MUPIROCIN 2 % EX OINT
TOPICAL_OINTMENT | Freq: Two times a day (BID) | CUTANEOUS | Status: DC
  Administered 2015-09-21: 1 via NASAL
  Filled 2015-09-21: qty 22

## 2015-09-21 MED ORDER — VITAMIN B-1 100 MG PO TABS
100.0000 mg | ORAL_TABLET | Freq: Every day | ORAL | Status: DC
  Administered 2015-09-21 – 2015-09-22 (×2): 100 mg via ORAL
  Filled 2015-09-21 (×2): qty 1

## 2015-09-21 MED ORDER — ENOXAPARIN SODIUM 40 MG/0.4ML ~~LOC~~ SOLN
40.0000 mg | SUBCUTANEOUS | Status: DC
  Administered 2015-09-21 – 2015-09-22 (×2): 40 mg via SUBCUTANEOUS
  Filled 2015-09-21 (×2): qty 0.4

## 2015-09-21 MED ORDER — IRBESARTAN 300 MG PO TABS
300.0000 mg | ORAL_TABLET | Freq: Every day | ORAL | Status: DC
  Administered 2015-09-22: 300 mg via ORAL
  Filled 2015-09-21: qty 1

## 2015-09-21 MED ORDER — ASPIRIN EC 81 MG PO TBEC
81.0000 mg | DELAYED_RELEASE_TABLET | Freq: Every day | ORAL | Status: DC
  Administered 2015-09-22: 81 mg via ORAL
  Filled 2015-09-21: qty 1

## 2015-09-21 NOTE — H&P (Signed)
Date: 09/21/2015               Patient Name:  Joel Black MRN: [id number]  DOB: [birth year] Age / Sex: 60 y.o., male   PCP: No Pcp Per Patient         Medical Service: Internal Medicine Teaching Service         Attending Physician: Dr. Doneen Poisson, MD    First Contact: Dr. Peggyann Juba Pager: [phone]  Second Contact: Dr. Lawerance Bach Pager: [phone]       After Hours (After 5p/  First Contact Pager: [phone]  weekends / holidays): Second Contact Pager: [phone]   Chief Complaint: Chest pain  History of Present Illness: Joel Black is a 60 year old man with history of HTN who presents with substernal chest pressure and shortness of breath.  This morning around 0430, he was woken from sleep by painful cramping in his right calf.  When he woke up, he noticed a dull pressure in the middle of his chest, 5/10 intensity, with associated shortness of breath and dizziness.  No radiation, sweating, nausea, palpitations.  Made somewhat worse by movement, but not palpation.  Did not change when he walked downstairs.  Has never experienced this sensation before.  Now improved, with residual 1/10 chest discomfort.  Did not take any medicines at home.  Stable chronic cough productive for whitish sputum.  Exerted himself 2 weeks ago moving furniture with no chest pain or dyspnea, but otherwise is not routinely physically active.  No weight change or swelling.  Endorses more stress recently due to financial concerns.  This morning, he also noticed some red blood on the toilet paper after having a bowel movement.  No prior rectal bleeding, no melena, rectal pain, constipation.  Tends to have loose stools, no change in bowel habits.  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  No more recent travel or immobilization.  Two years ago he had an episode of dyspnea and chest discomfort and a negative CT PE.  He had a low risk stress EKG in 02/2014.  He has a stable RML  lung nodule which is being followed by CTs, last 01/2014.   Meds:  Current Meds  Medication Sig  . valsartan-hydrochlorothiazide (DIOVAN-HCT) 320-12.5 MG tablet Take 1 tablet by mouth daily.     Allergies: Allergies as of 09/21/2015  . (No Known Allergies)   Past Medical History:  Diagnosis Date  . Herniated cervical disc   . Hypertension   . Ulcer     Family History: No history of cardiac or pulmonary disease, or colorectal cancer.  Social History: Former smoker (quit 30 years ago, ~30 pack year history).  Drinks 3-4 glasses of wine daily.  No illicit drugs.  Review of Systems: A complete ROS was negative except as per HPI.  Physical Exam: Blood pressure 141/94, pulse 85, temperature 98 F (36.7 C), temperature source Oral, resp. rate 17, SpO2 98 %.  Physical Exam  Constitutional: He is oriented to person, place, and time. He appears well-developed and well-nourished. No distress.  HENT:  Head: Normocephalic and atraumatic.  Eyes: Conjunctivae are normal. No scleral icterus.  Neck: Normal range of motion. Neck supple.  Cardiovascular: Normal rate, regular rhythm and normal heart sounds.   Respiratory: Effort normal and breath sounds normal. No respiratory distress. He has no wheezes. He has no rales. He exhibits no tenderness.  GI: Soft. He exhibits no distension.  There is no tenderness.  Genitourinary: Rectum normal.  Musculoskeletal: Normal range of motion. He exhibits no edema or tenderness.  Neurological: He is alert and oriented to person, place, and time.  Skin: Skin is warm and dry. He is not diaphoretic.  Psychiatric: He has a normal mood and affect. His behavior is normal.    CBC Latest Ref Rng & Units 09/21/2015 02/13/2015 07/27/2013  WBC 4.0 - 10.5 K/uL 8.8 5.2 6.9  Hemoglobin 13.0 - 17.0 g/dL 78.215.8 95.615.7 21.316.1  Hematocrit 39.0 - 52.0 % 45.0 44.7 45.4  Platelets 150 - 400 K/uL 243 273 294   BMP Latest Ref Rng & Units 09/21/2015 02/13/2015 07/27/2013  Glucose 65 -  99 mg/dL 086(V105(H) 93 784(O110(H)  BUN 6 - 20 mg/dL 19 20 16   Creatinine 0.61 - 1.24 mg/dL 9.621.01 9.520.97 8.411.20  Sodium 135 - 145 mmol/L 135 138 139  Potassium 3.5 - 5.1 mmol/L 3.8 3.9 3.9  Chloride 101 - 111 mmol/L 104 103 104  CO2 22 - 32 mmol/L 23 25 27   Calcium 8.9 - 10.3 mg/dL 9.4 9.1 9.8   Troponin (Point of Care Test)  Recent Labs  09/21/15 0926  TROPIPOC 0.00    Recent Labs  09/21/15 0920  CKTOTAL 157    FOBT positive  EKG: NSR, nonsignificant ST depressions in II, III, and aVF with borderline biphasic T waves.  Unchanged from   CXR: No active cardiopulmonary disease  Stress EKG 02/2014: Negative for ischemia  LDL 01/2015: 157  Spirometry 08/2013: normal  Assessment & Plan by Problem: Active Problems:   Chest pain  #Chest Pain Chest pain and dyspnea concerning for ACS/unstable angina, PE, or anxiety.  Moderate risk for ACS (HEART 4), with risk factors of age, male sex, hypertension, hyperlipidemia, and sedentary life style.  PE is a consideration but he has no risk factors (last travel 1 month ago).   Aortic dissection is unlikely with ony mild hypertension, dull pain, and no mediastinal widening on CXR, No wheezing, change in cough, or fever to suggest asthma, pneumonia, or COPD.  Anxiety is a possible etiology if cardiopulmonary etiologies are ruled out. -Telemetry -Trend troponins -Repeat EKG -Asprin 324 mg -Check A1c, lipids -Voltaren gel for chest -GI cocktail  #Rectal Bleeding FOBT positive. Rectum with no external hemorrhoids, anal fissures, or excoriation.  No anemia or active hematochezzia on exam. Normal risk of colorectal cancer, with no family history of CRC or IBD. Has never had colonoscopy. -Outpatient colonoscopy  #Leg Cramps Chronic intermittent cramps, mostly in bilateral LEs.  No electrolyte disturbances.  No muscle tenderness or edema, normal CK.  Likely benign. -Outpatient follow-up  #HTN -Continue home meds  Dispo: Admit patient to Observation  with expected length of stay less than 2 midnights.  Signed: Alm BustardMatthew O'Sullivan, MD 09/21/2015, 11:38 AM  Pager: [phone]

## 2015-09-21 NOTE — ED Provider Notes (Addendum)
[facility]-EMERGENCY DEPT Provider Note   CSN: [id number] Arrival date & time: 09/21/15  0857     History   Chief Complaint Chief Complaint  Patient presents with  . Chest Pain    HPI Joel Black is a 60 y.o. male.  HPI Pt comes in with cc of chest pain. PT woke up at 4:30 am and had substenal, non radiating chest heaviness. Pain has been constant, no specific aggravating or relieving factors. Pt has associated dyspnea. No n/v/f/c/diophoresis. Pt also c/o blood in the tissue when he wiped - which is new. Pt has HTN, no other cardiac risk factors. Pt has no hx of PE, DVT and denies any exogenous estrogen use, long distance travels or surgery in the past 6 weeks, active cancer, recent immobilization.    Past Medical History:  Diagnosis Date  . Herniated cervical disc   . Hypertension   . Ulcer     Patient Active Problem List   Diagnosis Date Noted  . Stenosis, cervical spine 02/13/2015  . Shortness of breath 03/19/2014  . Solitary pulmonary nodule on lung CT 02/18/2014  . HTN (hypertension) 10/21/2012    History reviewed. No pertinent surgical history.     Home Medications    Prior to Admission medications   Medication Sig Start Date End Date Taking? Authorizing Provider  valsartan-hydrochlorothiazide (DIOVAN-HCT) 320-12.5 MG tablet Take 1 tablet by mouth daily. 02/13/15  Yes Gwenlyn Found Copland, MD  amLODipine (NORVASC) 5 MG tablet Take 1 tablet (5 mg total) by mouth at bedtime. Reported on 02/13/2015 Patient not taking: Reported on 09/21/2015 02/13/15   Pearline Cables, MD    Family History Family History  Problem Relation Age of Onset  . Cancer Father     lung  . Diabetes Sister     Social History Social History  Substance Use Topics  . Smoking status: Former Smoker    Types: Cigarettes    Quit date: 06/11/1986  . Smokeless tobacco: Never Used  . Alcohol use Yes     Comment: 2 glasses with dinner     Allergies   Review of patient's allergies  indicates no known allergies.   Review of Systems Review of Systems  ROS 10 Systems reviewed and are negative for acute change except as noted in the HPI.     Physical Exam Updated Vital Signs BP 141/94   Pulse 85   Temp 98 F (36.7 C) (Oral)   Resp 17   SpO2 98%   Physical Exam  Constitutional: He is oriented to person, place, and time. He appears well-developed.  HENT:  Head: Atraumatic.  Neck: Neck supple.  Cardiovascular: Normal rate.   Pulmonary/Chest: Effort normal.  Abdominal: Soft. There is no tenderness.  DRE reveals no melena, no BRBPR  Neurological: He is alert and oriented to person, place, and time.  Skin: Skin is warm.  Nursing note and vitals reviewed.    ED Treatments / Results  Labs (all labs ordered are listed, but only abnormal results are displayed) Labs Reviewed  BASIC METABOLIC PANEL - Abnormal; Notable for the following:       Result Value   Glucose, Bld 105 (*)    All other components within normal limits  POC OCCULT BLOOD, ED - Abnormal; Notable for the following:    Fecal Occult Bld POSITIVE (*)    All other components within normal limits  CBC  OCCULT BLOOD X 1 CARD TO LAB, STOOL  CK  I-STAT TROPOININ, ED  EKG  EKG Interpretation  Date/Time:  Saturday September 21 2015 09:03:58 EDT Ventricular Rate:  78 PR Interval:  130 QRS Duration: 80 QT Interval:  362 QTC Calculation: 412 R Axis:   68 Text Interpretation:  Normal sinus rhythm Nonspecific T wave abnormality Abnormal ECG No old tracing to compare Confirmed by Ethelda ChickJACUBOWITZ  MD, SAM [id number]) on 09/21/2015 9:14:28 AM       Radiology Dg Chest 2 View  Result Date: 09/21/2015 CLINICAL DATA:  Woke up this morning with severe right leg pain EXAM: CHEST  2 VIEW COMPARISON:  September 01, 2013 FINDINGS: The heart size and mediastinal contours are within normal limits. There is no focal infiltrate, pulmonary edema, or pleural effusion. The visualized skeletal structures are stable.  IMPRESSION: No active cardiopulmonary disease. Electronically Signed   By: Sherian ReinWei-Chen  Lin M.D.   On: 09/21/2015 09:57    Procedures Procedures (including critical care time)  Medications Ordered in ED Medications - No data to display   Initial Impression / Assessment and Plan / ED Course  I have reviewed the triage vital signs and the nursing notes.  Pertinent labs & imaging results that were available during my care of the patient were reviewed by me and considered in my medical decision making (see chart for details).  Clinical Course  Comment By Time  Pt is seeing Dr. Dallas Schimkeopeland. Advised that the GI workup will be needed - and if GI team doesn't get involved here, which is highly likely, he should see his pcp for the rectal bleed. Derwood KaplanAnkit Amaad Byers, MD 09/02 1118   Differential diagnosis includes: ACS syndrome CHF exacerbation Valvular disorder Myocarditis Pericarditis Pneumonia PE Musculoskeletal pain GERD PUD  Pt comes in with cc of chest pain. Chest pain is atypical - but heaviness type in the substernal region. Pt has GERD and is sure the pain is not the same. He has some dyspnea. HEAR score is 4 (1 each for hx, ekg, age, RF). His ekg has TWI in the inferior leads - no old ekg to compare.  We will admit for serial trops.  Pt also c/o blood when he wiped himself. He has never had a colonoscopy. This the the first time for him to notice blood. No signs of anal fissures or hemorrhoids. Guaic +. Hb is stable - he needs GI f/u.   Final Clinical Impressions(s) / ED Diagnoses   Final diagnoses:  BRBPR (bright red blood per rectum)  Atypical chest pain    New Prescriptions New Prescriptions   No medications on file     Derwood KaplanAnkit Makeisha Jentsch, MD 09/21/15 1116    Derwood KaplanAnkit Cadence Minton, MD 09/21/15 1119

## 2015-09-21 NOTE — ED Notes (Signed)
Report attempted 

## 2015-09-21 NOTE — ED Notes (Signed)
Patient transported to x-ray. ?

## 2015-09-21 NOTE — ED Notes (Signed)
Paged attendings resident for order to stepdown.

## 2015-09-21 NOTE — ED Notes (Signed)
2W unable to take patient on this floor d/t pt having chest tightness 3/10 pain scale.  No active chest pain to this unit.  Paged Dr. Josem KaufmannKlima and advised.  Orders to follow.

## 2015-09-21 NOTE — ED Triage Notes (Signed)
Patient was awakened this am 0430 this am with chest tightness and shortness of breath pain. Also had severe right leg cramp that has resolved. Also complains of dizziness during triage.

## 2015-09-22 DIAGNOSIS — R0789 Other chest pain: Secondary | ICD-10-CM

## 2015-09-22 DIAGNOSIS — R252 Cramp and spasm: Secondary | ICD-10-CM | POA: Diagnosis not present

## 2015-09-22 DIAGNOSIS — K625 Hemorrhage of anus and rectum: Secondary | ICD-10-CM | POA: Diagnosis not present

## 2015-09-22 DIAGNOSIS — Z87891 Personal history of nicotine dependence: Secondary | ICD-10-CM | POA: Diagnosis not present

## 2015-09-22 LAB — COMPREHENSIVE METABOLIC PANEL
ALBUMIN: 3.4 g/dL — AB (ref 3.5–5.0)
ALK PHOS: 49 U/L (ref 38–126)
ALT: 22 U/L (ref 17–63)
AST: 20 U/L (ref 15–41)
Anion gap: 7 (ref 5–15)
BUN: 18 mg/dL (ref 6–20)
CALCIUM: 8.8 mg/dL — AB (ref 8.9–10.3)
CO2: 24 mmol/L (ref 22–32)
CREATININE: 1.27 mg/dL — AB (ref 0.61–1.24)
Chloride: 106 mmol/L (ref 101–111)
GFR calc non Af Amer: 59 mL/min — ABNORMAL LOW (ref >60)
GLUCOSE: 102 mg/dL — AB (ref 65–99)
Potassium: 3.8 mmol/L (ref 3.5–5.1)
SODIUM: 137 mmol/L (ref 135–145)
Total Bilirubin: 0.5 mg/dL (ref 0.3–1.2)
Total Protein: 5.8 g/dL — ABNORMAL LOW (ref 6.5–8.1)

## 2015-09-22 LAB — CBC
HCT: 43.4 % (ref 39.0–52.0)
Hemoglobin: 14.6 g/dL (ref 13.0–17.0)
MCH: 31.9 pg (ref 26.0–34.0)
MCHC: 33.6 g/dL (ref 30.0–36.0)
MCV: 94.8 fL (ref 78.0–100.0)
PLATELETS: 191 10*3/uL (ref 150–400)
RBC: 4.58 MIL/uL (ref 4.22–5.81)
RDW: 13 % (ref 11.5–15.5)
WBC: 5.9 10*3/uL (ref 4.0–10.5)

## 2015-09-22 LAB — HEMOGLOBIN A1C
Hgb A1c MFr Bld: 5.6 % (ref 4.8–5.6)
MEAN PLASMA GLUCOSE: 114 mg/dL

## 2015-09-22 MED ORDER — CHLORHEXIDINE GLUCONATE CLOTH 2 % EX PADS
6.0000 | MEDICATED_PAD | Freq: Every day | CUTANEOUS | Status: DC
  Administered 2015-09-22: 6 via TOPICAL

## 2015-09-22 MED ORDER — MUPIROCIN 2 % EX OINT
1.0000 "application " | TOPICAL_OINTMENT | Freq: Two times a day (BID) | CUTANEOUS | Status: DC
  Administered 2015-09-22: 1 via NASAL
  Filled 2015-09-22: qty 22

## 2015-09-22 NOTE — Progress Notes (Signed)
Discharged home by wheelchair, accompanied by wife, discharge instructions given, belongings with pt.

## 2015-09-22 NOTE — Progress Notes (Signed)
   Subjective: Has some residual vague chest discomfort, 1/10 in intensity.  No dyspnea, palpitations, dizziness, nausea, rectal bleeding.    Objective:  Vital signs in last 24 hours: Vitals:   09/21/15 2000 09/22/15 0000 09/22/15 0400 09/22/15 0800  BP: 137/81 134/82 (!) 131/93 130/90  Pulse: 95 83 72   Resp: 13 14 10    Temp: 98.9 F (37.2 C) 98.3 F (36.8 C) 98.6 F (37 C) 97.7 F (36.5 C)  TempSrc: Oral Oral Oral Oral  SpO2: 97% 93% 96%   Weight:      Height:       Physical Exam  Constitutional: He is oriented to person, place, and time. He appears well-developed and well-nourished. No distress.  Cardiovascular: Normal rate and regular rhythm.   Pulmonary/Chest: Effort normal and breath sounds normal.  Neurological: He is alert and oriented to person, place, and time.  Skin: Skin is warm and dry.  Psychiatric: He has a normal mood and affect. His behavior is normal.   CMP Latest Ref Rng & Units 09/22/2015 09/21/2015 02/13/2015  Glucose 65 - 99 mg/dL 308(M102(H) 578(I105(H) 93  BUN 6 - 20 mg/dL 18 19 20   Creatinine 0.61 - 1.24 mg/dL 6.96(E1.27(H) 9.521.01 8.410.97  Sodium 135 - 145 mmol/L 137 135 138  Potassium 3.5 - 5.1 mmol/L 3.8 3.8 3.9  Chloride 101 - 111 mmol/L 106 104 103  CO2 22 - 32 mmol/L 24 23 25   Calcium 8.9 - 10.3 mg/dL 3.2(G8.8(L) 9.4 9.1  Total Protein 6.5 - 8.1 g/dL 4.0(N5.8(L) - 6.9  Total Bilirubin 0.3 - 1.2 mg/dL 0.5 - 0.5  Alkaline Phos 38 - 126 U/L 49 - 56  AST 15 - 41 U/L 20 - 20  ALT 17 - 63 U/L 22 - 25    Cardiac Panel (last 3 results)  Recent Labs  09/21/15 0920 09/21/15 1559 09/21/15 2130  CKTOTAL 157  --   --   TROPONINI  --  <0.03 <0.03   Lipid Panel     Component Value Date/Time   CHOL 246 (H) 09/21/2015 0920   TRIG 181 (H) 09/21/2015 0920   HDL 53 09/21/2015 0920   CHOLHDL 4.6 09/21/2015 0920   VLDL 36 09/21/2015 0920   LDLCALC 157 (H) 09/21/2015 0920   LDLDIRECT 157 (H) 02/13/2015 0926     Assessment/Plan:  Active Problems:   Atypical chest pain  BRBPR (bright red blood per rectum)  #Chest Pain Troponins negative, EKG with nonspecific changes and questionable pseudonormalization on repeat this morning.  Hyperlipidemia on repeat lipid panel, A1c pending.  His chest pain is not ACS, but ACSVD 14.6% 10-year risk.  Anxiety possible contributing etiology. -Discussed statin therapy.  He would prefer deciding with PCP. -PCP follow-up -Outpatient cardiology referral via PCP  #Rectal Bleeding -PCP follow-up for colonoscopy referral  Dispo: Anticipated discharge today.   Alm BustardMatthew O'Sullivan, MD 09/22/2015, 11:55 AM Pager: [phone]

## 2015-09-22 NOTE — Discharge Summary (Signed)
Name: Joel Black MRN: [id number] DOB: 04/24/1955 60 y.o. PCP: No Pcp Per Patient  Date of Admission: 09/21/2015  9:07 AM Date of Discharge: 09/22/2015 Attending Physician: Doneen Poisson, MD  Discharge Diagnosis: 1. Non-cardiac Chest Pain  2. Rectal Bleeding  3. Muscle Cramps  Discharge Medications:   Medication List    TAKE these medications   amLODipine 5 MG tablet Commonly known as:  NORVASC Take 1 tablet (5 mg total) by mouth at bedtime. Reported on 02/13/2015   valsartan-hydrochlorothiazide 320-12.5 MG tablet Commonly known as:  DIOVAN-HCT Take 1 tablet by mouth daily.       Disposition and follow-up:   Mr.Joel Black was discharged from [facility] in good condition.  At the hospital follow up visit please address:  1.  Evaluation and optimization of cardiac risk factors    2.  Abnormal EKG, need for cardiology follow-up  3.  GI bleeding, need for colonoscopy  4.  Muscle cramps  5.  Labs / imaging needed at time of follow-up: none  6.  Pending labs/ test needing follow-up: none  Follow-up Appointments: PCP, with instructions to procure cardiology and GI follow-up   Hospital Course by problem list:   1. Chest Pain In the early morning on the day of admission he was woken from sleep by a painful cramp of his right calf, and noticed dull, substernal chest pressure and dyspnea.  The pain did not was not affected by mild exertion.  No prior chest pain or dyspnea, but he has been more anxious recently with financial stressors.  Low risk stress EKG 1.5 years ago.  He was given full dose aspirin, and ACS ruled out with serial cardiac markers.  His EKG is abnormal, with nonspecific ST and T wave changes, but there were no signs of acute ischemia.  Evaluated cardiac risk factors, and found hyperlipidemia, calculated ASCVD 14.6% 10 year risk.  Offered to initiate statin therapy, but he would prefer to discuss with PCP.  Given  instructions to follow-up with PCP and cardiology.  2. Rectal Bleeding One episode of bright red blood staining the toilet paper after a bowel movement this morning.  No other rectal bleeding or melena.  Chronically has loose stools, no change in bowel habits.  Not anemic, not unexplained weight loss, or constitutional symptoms of malignancy or IBD.  FOBT positive.  No external hemorrhoid or fissure on exam.  Has never had a colonoscopy.  Discharged with instructions to schedule outpatient colonoscopy.  3. Muscle Cramps Personal history of intermittent painful muscle cramps which have not changed in intensity or frequency over time.  No fevers, weight loss, muscle tenderness, or weakness to suggest myopathy.  Electrolytes normal.  Likely benign.  Discharge Vitals:   BP 130/90 (BP Location: Left Arm)   Pulse 72   Temp 97.7 F (36.5 C) (Oral)   Resp 10   Ht 5\' 11"  (1.803 m)   Wt 184 lb 12.8 oz (83.8 kg)   SpO2 96%   BMI 25.77 kg/m   Physical Exam  Constitutional: He is oriented to person, place, and time. He appears well-developed and well-nourished. No distress.  Cardiovascular: Normal rate and regular rhythm.   Pulmonary/Chest: Effort normal and breath sounds normal.  Neurological: He is alert and oriented to person, place, and time.  Skin: Skin is warm and dry.  Psychiatric: He has a normal mood and affect. His behavior is normal.   Pertinent Labs, Studies, and Procedures:   Cardiac Panel (  last 3 results)  Recent Labs  09/21/15 0920 09/21/15 1559 09/21/15 2130  CKTOTAL 157  --   --   TROPONINI  --  <0.03 <0.03    Lipid Panel     Component Value Date/Time   CHOL 246 (H) 09/21/2015 0920   TRIG 181 (H) 09/21/2015 0920   HDL 53 09/21/2015 0920   CHOLHDL 4.6 09/21/2015 0920   VLDL 36 09/21/2015 0920   LDLCALC 157 (H) 09/21/2015 0920   LDLDIRECT 157 (H) 02/13/2015 0926   Hgb A1c 5.6%   Discharge Instructions:  You were evaluated in the hospital for chest pain and  shortness of breath.  We did NOT find that you had a heart attack, since the blood test for heart damage (troponins) was normal.  The EKGs to check the electrical activity of your heart were not normal, however.  We do not think that this is what caused your chest pain and shortness of breath, but it should be further investigated by a cardiologist outside the hospital.  We did not find any other dangerous causes for chest pain.  Follow-up with Dr Patsy Lageropland within the next 1-2 weeks for your chest pain and also rectal bleeding.  Please ask her to refer you to a cardiologist and also to a gastroenterologist for a colonoscopy.  Also, talk about whether you should take a statin and aspirin.  If you have more chest pain, shortness of breath, or dizziness, or you pass out, feel your heart racing or beating strangely, or have more bleeding from the rectum, come back to the ED.    Signed: Alm BustardMatthew O'Sullivan, MD 09/22/2015, 11:55 AM   Pager: [phone]

## 2015-09-22 NOTE — Discharge Instructions (Signed)
You were evaluated in the hospital for chest pain and shortness of breath.  We did NOT find that you had a heart attack, since the blood test for heart damage (troponins) was normal.  The EKGs to check the electrical activity of your heart were not normal, however.  We do not think that this is what caused your chest pain and shortness of breath, but it should be further investigated by a cardiologist outside the hospital.  We did not find any other dangerous causes for chest pain.  Follow-up with Dr Patsy Lageropland within the next 1-2 weeks for your chest pain and also rectal bleeding.  Please ask her to refer you to a cardiologist and also to a gastroenterologist for a colonoscopy.  Also, talk about whether you should take a statin and aspirin.  If you have more chest pain, shortness of breath, or dizziness, or you pass out, feel your heart racing or beating strangely, or have more bleeding from the rectum, come back to the ED.     Chest Pain Observation It is often hard to give a specific diagnosis for the cause of chest pain. Among other possibilities your symptoms might be caused by inadequate oxygen delivery to your heart (angina). Angina that is not treated or evaluated can lead to a heart attack (myocardial infarction) or death. Blood tests, electrocardiograms, and X-rays may have been done to help determine a possible cause of your chest pain. After evaluation and observation, your health care provider has determined that it is unlikely your pain was caused by an unstable condition that requires hospitalization. However, a full evaluation of your pain may need to be completed, with additional diagnostic testing as directed. It is very important to keep your follow-up appointments. Not keeping your follow-up appointments could result in permanent heart damage, disability, or death. If there is any problem keeping your follow-up appointments, you must call your health care provider. HOME CARE INSTRUCTIONS   Due to the slight chance that your pain could be angina, it is important to follow your health care provider's treatment plan and also maintain a healthy lifestyle:  Maintain or work toward achieving a healthy weight.  Stay physically active and exercise regularly.  Decrease your salt intake.  Eat a balanced, healthy diet. Talk to a dietitian to learn about heart-healthy foods.  Increase your fiber intake by including whole grains, vegetables, fruits, and nuts in your diet.  Avoid situations that cause stress, anger, or depression.  Take medicines as advised by your health care provider. Report any side effects to your health care provider. Do not stop medicines or adjust the dosages on your own.  Quit smoking. Do not use nicotine patches or gum until you check with your health care provider.  Keep your blood pressure, blood sugar, and cholesterol levels within normal limits.  Limit alcohol intake to no more than 1 drink per day for women who are not pregnant and 2 drinks per day for men.  Do not abuse drugs. SEEK IMMEDIATE MEDICAL CARE IF: You have severe chest pain or pressure which may include symptoms such as:  You feel pain or pressure in your arms, neck, jaw, or back.  You have severe back or abdominal pain, feel sick to your stomach (nauseous), or throw up (vomit).  You are sweating profusely.  You are having a fast or irregular heartbeat.  You feel short of breath while at rest.  You notice increasing shortness of breath during rest, sleep, or with activity.  You have chest pain that does not get better after rest or after taking your usual medicine.  You wake from sleep with chest pain.  You are unable to sleep because you cannot breathe.  You develop a frequent cough or you are coughing up blood.  You feel dizzy, faint, or experience extreme fatigue.  You develop severe weakness, dizziness, fainting, or chills. Any of these symptoms may represent a serious  problem that is an emergency. Do not wait to see if the symptoms will go away. Call your local emergency services (911 in the [place] Do not drive yourself to the hospital. MAKE SURE YOU:  Understand these instructions.  Will watch your condition.  Will get help right away if you are not doing well or get worse.   This information is not intended to replace advice given to you by your health care provider. Make sure you discuss any questions you have with your health care provider.   Document Released: 02/07/2010 Document Revised: 01/10/2013 Document Reviewed: 07/07/2012 Elsevier Interactive Patient Education [employer]

## 2015-09-25 ENCOUNTER — Telehealth: Payer: Self-pay | Admitting: Behavioral Health

## 2015-09-25 ENCOUNTER — Encounter: Payer: Self-pay | Admitting: Behavioral Health

## 2015-09-25 NOTE — Telephone Encounter (Signed)
Pre-Visit Call completed with patient and chart updated.   Pre-Visit Info documented in Specialty Comments under SnapShot.    

## 2015-09-26 ENCOUNTER — Ambulatory Visit (INDEPENDENT_AMBULATORY_CARE_PROVIDER_SITE_OTHER): Payer: Commercial Managed Care - PPO | Admitting: Family Medicine

## 2015-09-26 ENCOUNTER — Encounter: Payer: Self-pay | Admitting: Family Medicine

## 2015-09-26 VITALS — BP 120/84 | HR 84 | Temp 98.1°F | Ht 71.0 in | Wt 185.6 lb

## 2015-09-26 DIAGNOSIS — Z1211 Encounter for screening for malignant neoplasm of colon: Secondary | ICD-10-CM | POA: Diagnosis not present

## 2015-09-26 DIAGNOSIS — R911 Solitary pulmonary nodule: Secondary | ICD-10-CM

## 2015-09-26 DIAGNOSIS — K625 Hemorrhage of anus and rectum: Secondary | ICD-10-CM

## 2015-09-26 DIAGNOSIS — R079 Chest pain, unspecified: Secondary | ICD-10-CM

## 2015-09-26 DIAGNOSIS — E785 Hyperlipidemia, unspecified: Secondary | ICD-10-CM | POA: Diagnosis not present

## 2015-09-26 NOTE — Patient Instructions (Addendum)
I am going to refer you to GI for a colonoscopy. Also tell them about the feeling in your chest/ throat; they may want to do an upper GI scope for you as well.  I will also arrange your follow-up chest CT.   Let me know if you do not hear about either of these appointments  As far as your cholesterol; make a good effort with diet and exercise over the next 3 months and we can repeat a cholesterol panel.  If you are not improved we should consider a low dose of a cholesterol medication  As you may recall, we have ben following a lung nodule for you.  I'll schedule your follow-up scan for you  Overall your BP looks ok, continue current medication.   Please call your cardiologist at [facility] and schedule a follow-up [address], [place], [place] [address]  Phone: [phone]  I am not sure if nitroglycerin would be helpful for you as it seems that your pain is not cardiac.  However certainly ask Dr. Jacinto HalimGanji about this  Please see me in 3 months for a physical!!

## 2015-09-26 NOTE — Progress Notes (Signed)
Pre visit review using our clinic review tool, if applicable. No additional management support is needed unless otherwise documented below in the visit note. 

## 2015-09-26 NOTE — Progress Notes (Signed)
[facility] [address] [place], [place] [address] [phone]  Date:  09/26/2015   Name:  Joel Black   DOB:  [birth year]   MRN:  [id number]  PCP:  Abbe Amsterdam, MD    Chief Complaint: Establish Care (Pt here to est care.  Seen in ER 09/21/15. )   History of Present Illness:  Joel Black is a 60 y.o. very pleasant male patient who presents with the following:  Last seen by myself in January with the following partial HPI: Here today to follow-up on HTN. He was last seen her in 2015 with SOB, had a CT of his chest and saw cardiology.  ETT 02/2014 was negative.  He did have a likely benign nodule in his chest and missed his 6 month follow-up CT.      BP Readings from Last 3 Encounters:  02/13/15 150/90  09/01/13 112/74  07/27/13 150/100   He is on volsartan/ hctz 320/12.5.  He had run out for a while and just started back on this a week or so ago. Not taking amlodipine- it is unclear why he stopped taking this but he would be ok with going back on if needed  He was admitted overnight from 9/2 to 09/22/15 with CP, rectal bleeding and muscle cramps. He ruled out for ACS, and planned to schedule an outpt colonoscopy.   He had a lipid panel on 9/2, and he is not anemic  He did end up getting his CT chest in January but has not yet done the 6 month follow-up IMPRESSION: 1. Stable 9 x 7 mm ovoid nodule in right middle lobe. No new pulmonary nodule is noted. Follow-up examination in 6 months is recommended. This recommendation follows the consensus statement: Guidelines for Management of Small Pulmonary Nodules Detected on CT Scans: A Statement from the Fleischner Society as published in Radiology 2005; 237:395-400. 2. No acute infiltrate or pulmonary edema. Stable small emphysematous bulla in right apex. 3. Tiny hiatal hernia. 4. Bilateral nonobstructive nephrolithiasis.    Component Value  Date/Time   CHOL 246 (H) 09/21/2015 0920   TRIG 181 (H) 09/21/2015 0920   HDL 53 09/21/2015 0920   LDLDIRECT 157 (H) 02/13/2015 0926   VLDL 36 09/21/2015 0920   CHOLHDL 4.6 09/21/2015 0920   His chest feels better, but he describes "a ballon with a slow leak" feeling under his sternum.    He does not notice it really when he is busy, but it is more prominent if he is quiet or sitting.  It is not exertional. We had had him see cardiology in the past for CP- last did a stress in 2016.  So far his heart has looked ok on eval, has not had a cath as of yet No difficulty swallowing He is waking up a lot at night He has been under some stress recently due to finances and thinks this may be part of his problem  He is married, works as a Biomedical engineer Problem List   Diagnosis Date Noted  . Atypical chest pain 09/21/2015  . BRBPR (bright red blood per rectum)   . Stenosis, cervical spine 02/13/2015  . Shortness of breath 03/19/2014  . Solitary pulmonary nodule on lung CT 02/18/2014  . HTN (hypertension) 10/21/2012    Past Medical History:  Diagnosis Date  . Herniated cervical disc   . Hypertension   . MRSA (methicillin resistant Staphylococcus  aureus)   . Nephrolithiasis   . Ulcer     No past surgical history on file.  Social History  Substance Use Topics  . Smoking status: Former Smoker    Packs/day: 2.00    Years: 15.00    Types: Cigarettes    Quit date: 06/11/1986  . Smokeless tobacco: Never Used  . Alcohol use 12.6 oz/week    21 Glasses of wine per week     Comment: 3-4 glasses with dinner    Family History  Problem Relation Age of Onset  . Cancer Father     lung  . Diabetes Sister     No Known Allergies  Medication list has been reviewed and updated.  Current Outpatient Prescriptions on File Prior to Visit  Medication Sig Dispense Refill  . valsartan-hydrochlorothiazide (DIOVAN-HCT) 320-12.5 MG tablet Take 1 tablet by mouth daily. 90 tablet 3    No current facility-administered medications on file prior to visit.     Review of Systems:  As per HPI- otherwise negative.   Physical Examination: Vitals:   09/26/15 1112  BP: (!) 128/96  Pulse: 84  Temp: 98.1 F (36.7 C)   Vitals:   09/26/15 1112  Weight: 185 lb 9.6 oz (84.2 kg)  Height: 5\' 11"  (1.803 m)   Body mass index is 25.89 kg/m. Ideal Body Weight: Weight in (lb) to have BMI = 25: 178.9  GEN: WDWN, NAD, Non-toxic, A & O x 3, looks well HEENT: Atraumatic, Normocephalic. Neck supple. No masses, No LAD.  Bilateral TM wnl, oropharynx normal.  PEERL,EOMI.   Ears and Nose: No external deformity. CV: RRR, No M/G/R. No JVD. No thrill. No extra heart sounds. PULM: CTA B, no wheezes, crackles, rhonchi. No retractions. No resp. distress. No accessory muscle use. ABD: S, NT, ND EXTR: No c/c/e NEURO Normal gait.  PSYCH: Normally interactive. Conversant. Not depressed or anxious appearing.  Calm demeanor.    Assessment and Plan: Rectal bleeding - Plan: Ambulatory referral to Gastroenterology  Chest pain, unspecified chest pain type  Dyslipidemia  Screening for colon cancer - Plan: Ambulatory referral to Gastroenterology  Lung nodule - Plan: CT Chest Wo Contrast here today to follow-up following recent overnight admission for chest pain and other sx.  He does not want to start on a statin right now, but will work on his lifestyle and then recheck in 3 months.  He is instructed to seek care right away for any worsening or persistent symptoms and to call me if any concerns   I am going to refer you to GI for a colonoscopy. Also tell them about the feeling in your chest/ throat; they may want to do an upper GI scope for you as well.  I will also arrange your follow-up chest CT.   Let me know if you do not hear about either of these appointments  As far as your cholesterol; make a good effort with diet and exercise over the next 3 months and we can repeat a cholesterol  panel.  If you are not improved we should consider a low dose of a cholesterol medication  As you may recall, we have ben following a lung nodule for you.  I'll schedule your follow-up scan for you  Overall your BP looks ok, continue current medication.   Please call your cardiologist at [facility] and schedule a follow-up [address] [place], [place], [place] [address]  Phone: [phone]  I am not sure if nitroglycerin would be helpful for  you as it seems that your pain is not cardiac.  However certainly ask Dr. Jacinto HalimGanji about this  Please see me in 3 months for a physical!!    Signed Abbe AmsterdamJessica Ellionna Buckbee, MD

## 2015-10-02 ENCOUNTER — Encounter: Payer: Self-pay | Admitting: Family Medicine

## 2015-10-02 ENCOUNTER — Ambulatory Visit (HOSPITAL_BASED_OUTPATIENT_CLINIC_OR_DEPARTMENT_OTHER)
Admission: RE | Admit: 2015-10-02 | Discharge: 2015-10-02 | Disposition: A | Discharge status: Home / Self Care | Payer: Commercial Managed Care - PPO | Source: Ambulatory Visit | Attending: Family Medicine | Admitting: Family Medicine

## 2015-10-02 DIAGNOSIS — R911 Solitary pulmonary nodule: Secondary | ICD-10-CM | POA: Diagnosis present

## 2015-10-02 DIAGNOSIS — I700 Atherosclerosis of aorta: Secondary | ICD-10-CM | POA: Diagnosis not present

## 2015-10-02 DIAGNOSIS — N200 Calculus of kidney: Secondary | ICD-10-CM | POA: Diagnosis not present

## 2015-10-02 DIAGNOSIS — I2510 Atherosclerotic heart disease of native coronary artery without angina pectoris: Secondary | ICD-10-CM | POA: Diagnosis not present

## 2015-10-03 ENCOUNTER — Institutional Professional Consult (permissible substitution): Payer: Commercial Managed Care - PPO | Admitting: Neurology

## 2015-10-07 ENCOUNTER — Encounter: Payer: Self-pay | Admitting: Family Medicine

## 2015-10-24 ENCOUNTER — Telehealth: Payer: Self-pay

## 2015-10-24 ENCOUNTER — Institutional Professional Consult (permissible substitution): Payer: Commercial Managed Care - PPO | Admitting: Neurology

## 2015-10-24 NOTE — Telephone Encounter (Signed)
Pt did not show for their appt with Dr. Dohmeier today.  

## 2015-10-28 ENCOUNTER — Encounter: Payer: Self-pay | Admitting: Neurology

## 2015-11-11 ENCOUNTER — Encounter: Payer: Self-pay | Admitting: Neurology

## 2015-11-11 ENCOUNTER — Ambulatory Visit (INDEPENDENT_AMBULATORY_CARE_PROVIDER_SITE_OTHER): Payer: Commercial Managed Care - PPO | Admitting: Neurology

## 2015-11-11 VITALS — BP 130/80 | HR 72 | Resp 20 | Ht 70.5 in | Wt 185.0 lb

## 2015-11-11 DIAGNOSIS — G4733 Obstructive sleep apnea (adult) (pediatric): Secondary | ICD-10-CM

## 2015-11-11 NOTE — Progress Notes (Signed)
SLEEP MEDICINE CLINIC   Provider:  Melvyn Novasarmen  Leon Montoya, M D  Referring Provider: Pearline Cablesopland, Jessica C, MD Primary Care Physician:  Abbe AmsterdamOPLAND,JESSICA, MD  Chief Complaint  Patient presents with  . New Patient (Initial Visit)    Dr. Jacinto HalimGanji referral, snores, stops breathing at night    HPI:  Joel Black is a 60 y.o. male , seen here as a referral from Dr. Jacinto HalimGanji,  Joel Black reports that at times he finds himself gasping for air , a startling arousal from sleep. His wife also has noted him to snore but also to breathe not very deep, shallow. His pulmonary workup evaluated for a dyspnea, the was a small nodule in the right middle lobe noted 9 x 7 mm in January 2016 which will be followed, he has developed mild emphysema in the right apex. Nonobstructive left nephrolithiasis, normal sinus rhythm cardiac arrhythmia,. Trace of tricuspid regurgitation, ejection fraction 55-60% by echocardiogram on 08/14/2013.   Chief complaint according to patient :  Sleep habits are as follows: Joel Black likes to spend his late night hours reading and writing, and usually will retreat to bed at about a half hour past midnight. He will sleep promptly, but wakes up between 3:30 and 4 AM. He does not sleep uninterrupted through the night as described above. He has a rather restless sleep rearrange his history sleep position. He does not favor either supine normal aside. He sleeps on one pillow. When he wakes up he has a dry mouth but not necessarily headaches. There is no reported nocturia. The patient usually does not wake up with palpitations or diaphoresis. Sometimes he will have vivid dreams, but he does not act out or sleepwalk.  He wakes at 6.30 to get his daughter to school- she is 62 years old.   Sleep medical history and family sleep history: no family members with OSA.  Social history:  Father of an 62 year old, wife is physical ParamedicTherapist. Joel Black used to smoke but quit in 1988.  ETOH ; wine drinker,  3-4 glasses a day. caffeine use ;  Tea in PM , cup of coffee in AM>   Review of Systems: Out of a complete 14 system review, the patient complains of only the following symptoms, and all other reviewed systems are negative.  Her anxiety, snoring, shortness of breath, high cholesterol, sometimes stopping breathing while sleeping.  Epworth score 8  , Fatigue severity score 51   , depression score 2/15    Social History   Social History  . Marital status: Married    Spouse name: N/A  . Number of children: N/A  . Years of education: N/A   Occupational History  . artist/sculptor     Social History Main Topics  . Smoking status: Former Smoker    Packs/day: 2.00    Years: 15.00    Types: Cigarettes    Quit date: 06/11/1986  . Smokeless tobacco: Never Used  . Alcohol use 12.6 oz/week    21 Glasses of wine per week     Comment: 3-4 glasses with dinner  . Drug use: No  . Sexual activity: Yes    Partners: Female   Other Topics Concern  . Not on file   Social History Narrative  . No narrative on file    Family History  Problem Relation Age of Onset  . Cancer Father     lung  . Diabetes Sister     Past Medical History:  Diagnosis Date  .  Herniated cervical disc   . Hypertension   . MRSA (methicillin resistant Staphylococcus aureus)   . Nephrolithiasis   . Ulcer (HCC)     No past surgical history on file.  Current Outpatient Prescriptions  Medication Sig Dispense Refill  . valsartan-hydrochlorothiazide (DIOVAN-HCT) 320-12.5 MG tablet Take 1 tablet by mouth daily. 90 tablet 3   No current facility-administered medications for this visit.     Allergies as of 11/11/2015  . (No Known Allergies)    Vitals: BP 130/80   Pulse 72   Resp 20   Ht 5' 10.5" (1.791 m)   Wt 185 lb (83.9 kg)   BMI 26.17 kg/m  Last Weight:  Wt Readings from Last 1 Encounters:  11/11/15 185 lb (83.9 kg)   WUJ:WJXB mass index is 26.17 kg/m.     Last Height:   Ht Readings from Last 1  Encounters:  11/11/15 5' 10.5" (1.791 m)    Physical exam:  General: The patient is awake, alert and appears not in acute distress. The patient is well groomed. Head: Normocephalic, atraumatic. Neck is supple. Mallampati 3.  neck circumference: 16.5 . Nasal airflow patent , TMJ is not evident .  Cardiovascular:  Regular rate and rhythm, without  murmurs or carotid bruit, and without distended neck veins. Respiratory: Lungs are clear to auscultation. Skin:  Without evidence of edema, or rash Trunk: The patient's posture is erect.  Neurologic exam : The patient is awake and alert, oriented to place and time.   Memory subjective  described as intact.  Attention span & concentration ability appears normal.  Speech is fluent,  without dysarthria, dysphonia or aphasia.  Mood and affect are appropriate.  Cranial nerves: Pupils are equal and briskly reactive to light. Funduscopic exam without  evidence of pallor or edema.  Extraocular movements  in vertical and horizontal planes intact and without nystagmus. Visual fields by finger perimetry are intact. Hearing to finger rub intact.  Facial sensation intact to fine touch. Facial motor strength is symmetric and tongue and uvula move midline. Shoulder shrug was symmetrical.   Motor exam: Normal tone, muscle bulk and symmetric strength in all extremities. Sensory:  Fine touch, pinprick and vibration were tested in all extremities.  Proprioception tested in the upper extremities was normal.  Coordination: Rapid alternating movements in the fingers/hands was normal.  Finger-to-nose maneuver  normal without evidence of ataxia, dysmetria or tremor.  Gait and station: Patient walks without assistive device and is able unassisted to climb up to the exam table. Strength within normal limits.  Stance is stable and normal.  Deep tendon reflexes: in the upper and lower extremities are symmetric and intact. Babinski maneuver response is   downgoing.  The patient was advised of the nature of the diagnosed sleep disorder , the treatment options and risks for general a health and wellness arising from not treating the condition.  I spent more than 35 minutes of face to face time with the patient. Greater than 50% of time was spent in counseling and coordination of care. We have discussed the diagnosis and differential and I answered the patient's questions.     Assessment:  After physical and neurologic examination, review of laboratory studies,  Personal review of imaging studies, reports of other /same  Imaging studies ,  Results of polysomnography/ neurophysiology testing and pre-existing records as far as provided in visit., my assessment is   1) I will invite Mr. Hoare for a sleep study, but due to his [place]  healthcare coverage we will only be allowed to do a home sleep test. This however will be a screening test for apnea and we will meet after the home sleep test has been done and interpreted.   Plan:  Treatment plan and additional workup :  HST, follow up with MD.    Melvyn Novas MD  11/11/2015   CC: Pearline Cables, Md [address] [place], [place] [address]

## 2015-12-03 ENCOUNTER — Encounter (INDEPENDENT_AMBULATORY_CARE_PROVIDER_SITE_OTHER): Payer: Commercial Managed Care - PPO | Admitting: Neurology

## 2015-12-03 DIAGNOSIS — G4733 Obstructive sleep apnea (adult) (pediatric): Secondary | ICD-10-CM

## 2015-12-10 ENCOUNTER — Telehealth: Payer: Self-pay

## 2015-12-10 DIAGNOSIS — G4733 Obstructive sleep apnea (adult) (pediatric): Secondary | ICD-10-CM

## 2015-12-10 DIAGNOSIS — J439 Emphysema, unspecified: Principal | ICD-10-CM

## 2015-12-10 DIAGNOSIS — G4736 Sleep related hypoventilation in conditions classified elsewhere: Secondary | ICD-10-CM

## 2015-12-10 NOTE — Telephone Encounter (Signed)
Ordered CPAP titration.

## 2015-12-10 NOTE — Telephone Encounter (Signed)
I spoke to pt. I advised him that his sleep study showed mild apnea with severe and prolonged hypoxemia. Dr. Vickey Hugerohmeier recommends that he come back for a CPAP titration study with possible oxygen titration in the lab. Pt is agreeable to a cpap titration study. I advised him that our sleep lab will call him to get that study scheduled. Pt verbalized understanding of results. Pt had no questions at this time but was encouraged to call back if questions arise.   Order for cpap titration not placed- will send to Dr. Vickey Hugerohmeier for review.

## 2015-12-17 ENCOUNTER — Telehealth: Payer: Self-pay | Admitting: Neurology

## 2015-12-17 DIAGNOSIS — G4733 Obstructive sleep apnea (adult) (pediatric): Secondary | ICD-10-CM

## 2015-12-17 NOTE — Telephone Encounter (Signed)
Changed to auto PAP.

## 2015-12-17 NOTE — Telephone Encounter (Signed)
I spoke to pt and advised him that his cpap titration study was denied by his insurance and Dr. Vickey Hugerohmeier has ordered an auto pap for pt. Pt is agreeable to this. I advised him that an order for apap will be sent to [facility] and they will call him to discuss set up and to fit him for a good mask. A follow up appt was made for pt on 02/04/16 at 9:00am. Pt verbalized understanding.

## 2015-12-17 NOTE — Telephone Encounter (Signed)
[employer] denied CPAP and suggested autopap °

## 2015-12-26 ENCOUNTER — Encounter: Payer: Self-pay | Admitting: Family Medicine

## 2015-12-26 ENCOUNTER — Ambulatory Visit (INDEPENDENT_AMBULATORY_CARE_PROVIDER_SITE_OTHER): Payer: Commercial Managed Care - PPO | Admitting: Family Medicine

## 2015-12-26 VITALS — BP 128/90 | HR 90 | Temp 98.6°F | Resp 16 | Ht 71.0 in | Wt 187.0 lb

## 2015-12-26 DIAGNOSIS — Z5181 Encounter for therapeutic drug level monitoring: Secondary | ICD-10-CM | POA: Diagnosis not present

## 2015-12-26 DIAGNOSIS — K625 Hemorrhage of anus and rectum: Secondary | ICD-10-CM | POA: Diagnosis not present

## 2015-12-26 DIAGNOSIS — E785 Hyperlipidemia, unspecified: Secondary | ICD-10-CM | POA: Diagnosis not present

## 2015-12-26 DIAGNOSIS — Z0000 Encounter for general adult medical examination without abnormal findings: Secondary | ICD-10-CM

## 2015-12-26 DIAGNOSIS — G4733 Obstructive sleep apnea (adult) (pediatric): Secondary | ICD-10-CM

## 2015-12-26 DIAGNOSIS — Z125 Encounter for screening for malignant neoplasm of prostate: Secondary | ICD-10-CM

## 2015-12-26 DIAGNOSIS — Z72820 Sleep deprivation: Secondary | ICD-10-CM

## 2015-12-26 DIAGNOSIS — I10 Essential (primary) hypertension: Secondary | ICD-10-CM | POA: Diagnosis not present

## 2015-12-26 LAB — CBC
HEMATOCRIT: 45.5 % (ref 39.0–52.0)
Hemoglobin: 15.3 g/dL (ref 13.0–17.0)
MCHC: 33.7 g/dL (ref 30.0–36.0)
MCV: 95.4 fl (ref 78.0–100.0)
Platelets: 291 10*3/uL (ref 150.0–400.0)
RBC: 4.77 Mil/uL (ref 4.22–5.81)
RDW: 13.4 % (ref 11.5–15.5)
WBC: 8 10*3/uL (ref 4.0–10.5)

## 2015-12-26 LAB — COMPREHENSIVE METABOLIC PANEL
ALK PHOS: 60 U/L (ref 39–117)
ALT: 24 U/L (ref 0–53)
AST: 21 U/L (ref 0–37)
Albumin: 4.4 g/dL (ref 3.5–5.2)
BILIRUBIN TOTAL: 0.6 mg/dL (ref 0.2–1.2)
BUN: 21 mg/dL (ref 6–23)
CO2: 29 meq/L (ref 19–32)
Calcium: 9.8 mg/dL (ref 8.4–10.5)
Chloride: 102 mEq/L (ref 96–112)
Creatinine, Ser: 1.18 mg/dL (ref 0.40–1.50)
GFR: 66.37 mL/min (ref >60.00)
GLUCOSE: 98 mg/dL (ref 70–99)
Potassium: 4.4 mEq/L (ref 3.5–5.1)
SODIUM: 139 meq/L (ref 135–145)
TOTAL PROTEIN: 7.3 g/dL (ref 6.0–8.3)

## 2015-12-26 LAB — PSA: PSA: 0.84 ng/mL (ref 0.10–4.00)

## 2015-12-26 MED ORDER — LOVASTATIN 20 MG PO TABS: 20.0000 mg | ORAL_TABLET | Freq: Every day | ORAL | 6 refills | Status: DC

## 2015-12-26 NOTE — Progress Notes (Signed)
Pre visit review using our clinic review tool, if applicable. No additional management support is needed unless otherwise documented below in the visit note. 

## 2015-12-26 NOTE — Progress Notes (Signed)
[facility] [address], [address] [place], [place] [address] [phone] [phone]  Date:  12/26/2015   Name:  Joel Black   DOB:  11/25/1955   MRN:  [id number]  PCP:  Abbe AmsterdamOPLAND,Tameika Heckmann, MD    Chief Complaint: Annual Exam   History of Present Illness:  Joel Black is a 60 y.o. very pleasant male patient who presents with the following:  Here today for his annual exam Last labs - CMP,lipids and CBC in September  He was admitted overnight from 9/2 to 9/3 with non- cardiac CP, rectal bleeding.  He had an ACS rule-out, did not do a stress as he had one about 1.5 years ago He also had an episode of BRBPR- was asked to schedule a colonoscopy.   He STILL has not done this He is on norvasc and diovan HCT.    He did see neurology and was dx with OSA this fall, started on CPAP- however he has not gotten his machine yet.    He has not had any chest pain, but does have some chest congestion at times No fever   We also did a follow-up check CT in September that was stable.    Last PSA in January of this year.  He notes that he is not sleeping that well- he will wake up early in the morning and has a hard time getting back to sleep. He gets to sleep ok but will wake up around 4am- he may lay in bed until 6:30. He goes to bed at MN.   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 He does not feel that depression is a concern, and does not feel anxious at this time   He is taking crestor 20, but stopped taking it as it seemed to worsen his reflux.   He is not taking any medication for reflux at this time - as long as he is not on crestor he does not have sx here.    Discussed a flu shot but he declines   He continues to notice some muscle cramps at times in his legs- these are not chronic, only occur occasionally    Lab Results  Component Value Date   CHOL 246 (H) 09/21/2015   CHOL 198 11/07/2012   Lab Results  Component Value Date   HDL 53 09/21/2015   HDL 56 11/07/2012   Lab Results  Component Value Date   LDLCALC 157 (H) 09/21/2015   LDLCALC 127 (H) 11/07/2012   Lab Results  Component Value Date   TRIG 181 (H) 09/21/2015   TRIG 77 11/07/2012   Lab Results  Component Value Date   CHOLHDL 4.6 09/21/2015   CHOLHDL 3.5 11/07/2012   Lab Results  Component Value Date   LDLDIRECT 157 (H) 02/13/2015    Patient Active Problem List   Diagnosis Date Noted  . Atypical chest pain 09/21/2015  . BRBPR (bright red blood per rectum)   . Stenosis, cervical spine 02/13/2015  . Shortness of breath 03/19/2014  . Solitary pulmonary nodule on lung CT 02/18/2014  . HTN (hypertension) 10/21/2012    Past Medical History:  Diagnosis Date  . Herniated cervical disc   . History of kidney stones   . Hypertension   . MRSA (methicillin resistant Staphylococcus aureus)   .  Ulcer (HCC)     No past surgical history on file.  Social History  Substance Use Topics  . Smoking status: Former Smoker    Packs/day: 2.00    Years: 15.00    Types: Cigarettes    Quit date: 06/11/1986  . Smokeless tobacco: Never Used  . Alcohol use 12.6 oz/week    21 Glasses of wine per week     Comment: 3-4 glasses with dinner    Family History  Problem Relation Age of Onset  . Cancer Father     lung  . Diabetes Sister     No Known Allergies  Medication list has been reviewed and updated.  Current Outpatient Prescriptions on File Prior to Visit  Medication Sig Dispense Refill  . valsartan-hydrochlorothiazide (DIOVAN-HCT) 320-12.5 MG tablet Take 1 tablet by mouth daily. 90 tablet 3   No current facility-administered medications on file prior to visit.     Review of Systems:  As per HPI- otherwise negative.   Physical Examination: Vitals:   12/26/15 0921  BP: 128/90  Pulse: 90  Resp: 16  Temp:  98.6 F (37 C)   Vitals:   12/26/15 0921  Weight: 187 lb (84.8 kg)  Height: 5\' 11"  (1.803 m)   Body mass index is 26.08 kg/m. Ideal Body Weight: Weight in (lb) to have BMI = 25: 178.9  GEN: WDWN, NAD, Non-toxic, A & O x 3, looks well HEENT: Atraumatic, Normocephalic. Neck supple. No masses, No LAD.  Bilateral TM wnl, oropharynx normal.  PEERL,EOMI.   Ears and Nose: No external deformity. CV: RRR, No M/G/R. No JVD. No thrill. No extra heart sounds. PULM: CTA B, no wheezes, crackles, rhonchi. No retractions. No resp. distress. No accessory muscle use. ABD: S, NT, ND EXTR: No c/c/e NEURO Normal gait.  PSYCH: Normally interactive. Conversant. Not depressed or anxious appearing.  Calm demeanor.    Assessment and Plan: Physical exam - Plan: PSA  Medication monitoring encounter - Plan: Comprehensive metabolic panel  Screening for prostate cancer  Dyslipidemia - Plan: lovastatin (MEVACOR) 20 MG tablet  OSA (obstructive sleep apnea)  Essential hypertension, benign - Plan: Comprehensive metabolic panel  Rectal bleeding - Plan: CBC  Poor sleep  Here today for a CPE He is working on getting CPA_- encouraged him to follow-up on this for the sake of his health. Also discussed melatonin as a sleep aid   It was lovely to see you today as always.   I would suggest that your try melatonin about 1 hour prior to when you would like to be asleep- this may help you stay asleep longer Let me know if your sleep does not improve with melatonin.   Getting your CPAP will probably also help your rest quality Your blood pressure looks good- I will check on your electrolytes today and let you know how they look.  You might try increasing your potassium intake 9 (through higher potassium foods).  We will try lovastatin for your cholesterol- you can titrate your dose to any side effects.  IE: if you have body aches, or GERD, try decreasing your dose or taking it every other day, etc.    Please  get your colonoscopy scheduled!!    Signed Abbe AmsterdamJessica Tirza Senteno, MD

## 2015-12-26 NOTE — Patient Instructions (Addendum)
It was lovely to see you today as always.   I would suggest that your try melatonin about 1 hour prior to when you would like to be asleep- this may help you stay asleep longer Let me know if your sleep does not improve with melatonin.   Getting your CPAP will probably also help your rest quality Your blood pressure looks good- I will check on your electrolytes today and let you know how they look.  You might try increasing your potassium intake 9 (through higher potassium foods).  We will try lovastatin for your cholesterol- you can titrate your dose to any side effects.  IE: if you have body aches, or GERD, try decreasing your dose or taking it every other day, etc.    Please get your colonoscopy scheduled!!  [facility]: Address: [address], [place], [place] [address]                        Phone: [phone]

## 2016-01-24 ENCOUNTER — Telehealth: Payer: Self-pay | Admitting: Neurology

## 2016-01-24 NOTE — Telephone Encounter (Signed)
Patient requesting call back from nurse. He has not gotten a call from [facility]. Best call back is [phone]

## 2016-01-27 NOTE — Telephone Encounter (Signed)
I spoke to [place] at [facility]. She reports that she has been trying to get in touch with pt since last month. She did speak with him today and he is going to discuss financials with his wife and call [facility] back.

## 2016-01-27 NOTE — Telephone Encounter (Signed)
I called pt. He has not heard from [facility]. Pt's 02/04/16 appt rescheduled for 03/25/16 at 2:30pm. Pt verbalized understanding of new appt date and time.  I reached out to [facility] to find out what is going on and if they need anything from us.

## 2016-02-03 ENCOUNTER — Emergency Department (HOSPITAL_COMMUNITY): Payer: Commercial Managed Care - PPO

## 2016-02-03 ENCOUNTER — Encounter (HOSPITAL_COMMUNITY): Payer: Self-pay

## 2016-02-03 ENCOUNTER — Emergency Department (HOSPITAL_COMMUNITY)
Admission: EM | Admit: 2016-02-03 | Discharge: 2016-02-04 | Disposition: A | Discharge status: Home / Self Care | Payer: Commercial Managed Care - PPO | Attending: Emergency Medicine | Admitting: Emergency Medicine

## 2016-02-03 DIAGNOSIS — Z87891 Personal history of nicotine dependence: Secondary | ICD-10-CM | POA: Diagnosis not present

## 2016-02-03 DIAGNOSIS — N201 Calculus of ureter: Secondary | ICD-10-CM | POA: Insufficient documentation

## 2016-02-03 DIAGNOSIS — R109 Unspecified abdominal pain: Secondary | ICD-10-CM | POA: Diagnosis present

## 2016-02-03 DIAGNOSIS — I10 Essential (primary) hypertension: Secondary | ICD-10-CM | POA: Insufficient documentation

## 2016-02-03 DIAGNOSIS — Z79899 Other long term (current) drug therapy: Secondary | ICD-10-CM | POA: Insufficient documentation

## 2016-02-03 LAB — COMPREHENSIVE METABOLIC PANEL
ALBUMIN: 3.7 g/dL (ref 3.5–5.0)
ALT: 26 U/L (ref 17–63)
AST: 22 U/L (ref 15–41)
Alkaline Phosphatase: 57 U/L (ref 38–126)
Anion gap: 11 (ref 5–15)
BUN: 14 mg/dL (ref 6–20)
CHLORIDE: 99 mmol/L — AB (ref 101–111)
CO2: 21 mmol/L — ABNORMAL LOW (ref 22–32)
Calcium: 8.9 mg/dL (ref 8.9–10.3)
Creatinine, Ser: 1.6 mg/dL — ABNORMAL HIGH (ref 0.61–1.24)
GFR calc Af Amer: 52 mL/min — ABNORMAL LOW (ref >60)
GFR calc non Af Amer: 45 mL/min — ABNORMAL LOW (ref >60)
GLUCOSE: 108 mg/dL — AB (ref 65–99)
POTASSIUM: 3.5 mmol/L (ref 3.5–5.1)
Sodium: 131 mmol/L — ABNORMAL LOW (ref 135–145)
Total Bilirubin: 0.9 mg/dL (ref 0.3–1.2)
Total Protein: 6.9 g/dL (ref 6.5–8.1)

## 2016-02-03 LAB — URINALYSIS, ROUTINE W REFLEX MICROSCOPIC
BACTERIA UA: NONE SEEN
BILIRUBIN URINE: NEGATIVE
Glucose, UA: 50 mg/dL — AB
KETONES UR: 20 mg/dL — AB
LEUKOCYTES UA: NEGATIVE
Nitrite: NEGATIVE
PROTEIN: NEGATIVE mg/dL
SPECIFIC GRAVITY, URINE: 1.016 (ref 1.005–1.030)
pH: 7 (ref 5.0–8.0)

## 2016-02-03 LAB — CBC
HEMATOCRIT: 40.1 % (ref 39.0–52.0)
HEMOGLOBIN: 14 g/dL (ref 13.0–17.0)
MCH: 32 pg (ref 26.0–34.0)
MCHC: 34.9 g/dL (ref 30.0–36.0)
MCV: 91.6 fL (ref 78.0–100.0)
Platelets: 245 10*3/uL (ref 150–400)
RBC: 4.38 MIL/uL (ref 4.22–5.81)
RDW: 12.6 % (ref 11.5–15.5)
WBC: 13.2 10*3/uL — AB (ref 4.0–10.5)

## 2016-02-03 LAB — LIPASE, BLOOD: LIPASE: 31 U/L (ref 11–51)

## 2016-02-03 MED ORDER — ONDANSETRON HCL 4 MG/2ML IJ SOLN
4.0000 mg | Freq: Once | INTRAMUSCULAR | Status: AC
  Administered 2016-02-03: 4 mg via INTRAVENOUS
  Filled 2016-02-03: qty 2

## 2016-02-03 MED ORDER — HYDROMORPHONE HCL 2 MG/ML IJ SOLN
1.0000 mg | Freq: Once | INTRAMUSCULAR | Status: AC
  Administered 2016-02-03: 1 mg via INTRAVENOUS
  Filled 2016-02-03: qty 1

## 2016-02-03 NOTE — ED Notes (Signed)
Patient transported to CT 

## 2016-02-03 NOTE — ED Triage Notes (Signed)
Pt endorses n/v/d x 4 days and states "I think I also have a kidney stone that started yesterday" Pt has hx of kidney stones.

## 2016-02-03 NOTE — ED Notes (Addendum)
Pt was at home yesterday when he started to have right flank pain. Pt drank water and took his flomax and the pain went away. Pt had a meeting today and then he started to get pain when he again took his flomax and hydrocodone. Neither worked this time so he came to the ED.

## 2016-02-03 NOTE — ED Provider Notes (Signed)
[facility] DEPT Provider Note   CSN: [id number] Arrival date & time: 02/03/16  2016     History   Chief Complaint Chief Complaint  Patient presents with  . Flank Pain    HPI Joel Black is a 61 y.o. male.  HPI Joel Black is a 61 y.o. male with PMH significant for HTN and nephrolithiasis who presents with sudden onset, colicky, intense, aching right flank pain with associated nausea x 2 days. No fever, chills, diarrhea, abdominal pain, or urinary symptoms.  He states this feels similar to prior kidney stones.  He took a Tramadol with minimal relief.  He has not seen a urologist in 4 years.  Past Medical History:  Diagnosis Date  . Herniated cervical disc   . History of kidney stones   . Hypertension   . MRSA (methicillin resistant Staphylococcus aureus)   . Ulcer [facility])     Patient Active Problem List   Diagnosis Date Noted  . Atypical chest pain 09/21/2015  . BRBPR (bright red blood per rectum)   . Stenosis, cervical spine 02/13/2015  . Shortness of breath 03/19/2014  . Solitary pulmonary nodule on lung CT 02/18/2014  . HTN (hypertension) 10/21/2012    History reviewed. No pertinent surgical history.     Home Medications    Prior to Admission medications   Medication Sig Start Date End Date Taking? Authorizing Provider  bisacodyl (DULCOLAX) 5 MG EC tablet Take 5 mg by mouth daily as needed for moderate constipation.   Yes Historical Provider, MD  bismuth subsalicylate (PEPTO BISMOL) 262 MG/15ML suspension Take 30 mLs by mouth every 6 (six) hours as needed for indigestion.   Yes Historical Provider, MD  HYDROcodone-acetaminophen (NORCO/VICODIN) 5-325 MG tablet Take 1 tablet by mouth every 6 (six) hours as needed for moderate pain.   Yes Historical Provider, MD  ibuprofen (ADVIL,MOTRIN) 200 MG tablet Take 400 mg by mouth every 6 (six) hours as needed.   Yes Historical Provider, MD  lovastatin (MEVACOR) 20 MG tablet Take 1 tablet (20 mg  total) by mouth at bedtime. 12/26/15  Yes Gwenlyn Found Copland, MD  tamsulosin (FLOMAX) 0.4 MG CAPS capsule Take 0.4 mg by mouth daily as needed (for urinary flow).   Yes Historical Provider, MD  traMADol (ULTRAM) 50 MG tablet Take 50 mg by mouth every 6 (six) hours as needed for moderate pain.   Yes Historical Provider, MD  valsartan-hydrochlorothiazide (DIOVAN-HCT) 320-12.5 MG tablet Take 1 tablet by mouth daily. 02/13/15  Yes Gwenlyn Found Copland, MD  ondansetron (ZOFRAN) 4 MG tablet Take 1 tablet (4 mg total) by mouth every 6 (six) hours. 02/04/16   Cheri Fowler, PA-C  oxyCODONE-acetaminophen (PERCOCET/ROXICET) 5-325 MG tablet Take 1 tablet by mouth every 4 (four) hours as needed for severe pain. 02/04/16   Cheri Fowler, PA-C    Family History Family History  Problem Relation Age of Onset  . Cancer Father     lung  . Diabetes Sister     Social History Social History  Substance Use Topics  . Smoking status: Former Smoker    Packs/day: 2.00    Years: 15.00    Types: Cigarettes    Quit date: 06/11/1986  . Smokeless tobacco: Never Used  . Alcohol use 12.6 oz/week    21 Glasses of wine per week     Comment: 3-4 glasses with dinner     Allergies   Patient has no known allergies.   Review of Systems Review of Systems All other  systems negative unless otherwise stated in HPI   Physical Exam Updated Vital Signs BP 150/94 (BP Location: Left Arm)   Pulse 92   Temp 98 F (36.7 C) (Oral)   Resp 16   Ht 5' 11.5" (1.816 m)   Wt 83.9 kg   SpO2 98%   BMI 25.44 kg/m   Physical Exam  Constitutional: He is oriented to person, place, and time. He appears well-developed and well-nourished.  Non-toxic appearance. He does not have a sickly appearance. He does not appear ill.  HENT:  Head: Normocephalic and atraumatic.  Mouth/Throat: Oropharynx is clear and moist.  Eyes: Conjunctivae are normal. Pupils are equal, round, and reactive to light.  Neck: Normal range of motion. Neck supple.    Cardiovascular: Normal rate and regular rhythm.   Pulmonary/Chest: Effort normal and breath sounds normal. No accessory muscle usage or stridor. No respiratory distress. He has no wheezes. He has no rhonchi. He has no rales.  Abdominal: Soft. Bowel sounds are normal. He exhibits no distension. There is no tenderness.  No CVA tenderness bilaterally.   Musculoskeletal: Normal range of motion.  Lymphadenopathy:    He has no cervical adenopathy.  Neurological: He is alert and oriented to person, place, and time.  Speech clear without dysarthria.  Skin: Skin is warm and dry.  Psychiatric: He has a normal mood and affect. His behavior is normal.     ED Treatments / Results  Labs (all labs ordered are listed, but only abnormal results are displayed) Labs Reviewed  COMPREHENSIVE METABOLIC PANEL - Abnormal; Notable for the following:       Result Value   Sodium 131 (*)    Chloride 99 (*)    CO2 21 (*)    Glucose, Bld 108 (*)    Creatinine, Ser 1.60 (*)    GFR calc non Af Amer 45 (*)    GFR calc Af Amer 52 (*)    All other components within normal limits  CBC - Abnormal; Notable for the following:    WBC 13.2 (*)    All other components within normal limits  URINALYSIS, ROUTINE W REFLEX MICROSCOPIC - Abnormal; Notable for the following:    Glucose, UA 50 (*)    Hgb urine dipstick SMALL (*)    Ketones, ur 20 (*)    Squamous Epithelial / LPF 0-5 (*)    All other components within normal limits  LIPASE, BLOOD    EKG  EKG Interpretation None       Radiology Ct Renal Stone Study  Result Date: 02/03/2016 CLINICAL DATA:  Right flank pain with nausea and vomiting for 2 days. History of stones appear EXAM: CT ABDOMEN AND PELVIS WITHOUT CONTRAST TECHNIQUE: Multidetector CT imaging of the abdomen and pelvis was performed following the standard protocol without IV contrast. COMPARISON:  None. FINDINGS: Lower chest: No acute findings.  Small hiatal hernia. Hepatobiliary: No focal liver  abnormality is seen. No gallstones, gallbladder wall thickening, or biliary dilatation. Pancreas: Unremarkable. No pancreatic ductal dilatation or surrounding inflammatory changes. Spleen: Normal in size without focal abnormality. Adrenals/Urinary Tract: 4 x 3 mm stone within the mid right ureter causing moderate to severe hydronephrosis and associated perinephric edema. Possible punctate nonobstructing left renal stone. No left-sided ureteral or bladder stone. Bladder appears normal. Stomach/Bowel: Bowel is normal in caliber. Scattered diverticulosis noted throughout the descending and sigmoid colon without evidence of acute diverticulitis. Appendix is normal. Vascular/Lymphatic: Scattered atherosclerotic changes of the normal-caliber abdominal aorta. No enlarged lymph nodes  identified Reproductive: Unremarkable. Other: None Musculoskeletal: Mild degenerative change within the slightly scoliotic lumbar spine. No acute or suspicious osseous finding. Superficial soft tissues are unremarkable. IMPRESSION: 1. Obstructing stone within the mid right ureter (L5 vertebral body level), measuring 4 x 3 mm, causing moderate to severe hydronephrosis of the right kidney and associated perinephric/periureteral edema. 2. Colonic diverticulosis without evidence of acute diverticulitis. 3. Aortic atherosclerosis. 4. Additional chronic/incidental findings detailed above. Electronically Signed   By: Bary Richard M.D.   On: 02/03/2016 23:45    Procedures Procedures (including critical care time)  Medications Ordered in ED Medications  oxyCODONE-acetaminophen (PERCOCET/ROXICET) 5-325 MG per tablet 2 tablet (not administered)  HYDROmorphone (DILAUDID) injection 1 mg (1 mg Intravenous Given 02/03/16 2322)  ondansetron (ZOFRAN) injection 4 mg (4 mg Intravenous Given 02/03/16 2320)     Initial Impression / Assessment and Plan / ED Course  I have reviewed the triage vital signs and the nursing notes.  Pertinent labs &  imaging results that were available during my care of the patient were reviewed by me and considered in my medical decision making (see chart for details).  Clinical Course    Findings c/w urolithiasis.  Slightly elevated Cr from baseline.  Do not suspect postobstructive uropathy.  CT shows normal appearance of left kidney.  UA non-infectious.  Patient feels improved after 1g dilaudid.  No vomiting throughout ED stay.  Discussed close follow up with urology and PCP for Cr recheck and stone passage.  Home with percocet and zofran.  Return precautions discussed.  Stable for discharge.   Final Clinical Impressions(s) / ED Diagnoses   Final diagnoses:  Calculus of ureter    New Prescriptions New Prescriptions   ONDANSETRON (ZOFRAN) 4 MG TABLET    Take 1 tablet (4 mg total) by mouth every 6 (six) hours.   OXYCODONE-ACETAMINOPHEN (PERCOCET/ROXICET) 5-325 MG TABLET    Take 1 tablet by mouth every 4 (four) hours as needed for severe pain.     Cheri Fowler, PA-C 02/04/16 0017    Gilda Crease, MD 02/04/16 [phone]

## 2016-02-04 ENCOUNTER — Ambulatory Visit: Payer: Self-pay | Admitting: Neurology

## 2016-02-04 MED ORDER — OXYCODONE-ACETAMINOPHEN 5-325 MG PO TABS: 1.0000 | ORAL_TABLET | ORAL | 0 refills | Status: DC | PRN

## 2016-02-04 MED ORDER — ONDANSETRON HCL 4 MG PO TABS: 4.0000 mg | ORAL_TABLET | Freq: Four times a day (QID) | ORAL | 0 refills | Status: DC

## 2016-02-04 MED ORDER — OXYCODONE-ACETAMINOPHEN 5-325 MG PO TABS
2.0000 | ORAL_TABLET | Freq: Once | ORAL | Status: AC
  Administered 2016-02-04: 2 via ORAL
  Filled 2016-02-04: qty 2

## 2016-02-04 NOTE — Discharge Instructions (Signed)
Your kidney function is slightly elevated, this needs to be closely followed and rechecked in 5-7 days.  Drink plenty of fluids.  Take Percocet for pain.  Zofran for nausea.  Follow up with urology in the next 7 days.  Return to the ED for fever, chills, persistent vomiting, uncontrolled pain, or any new or concerning symptoms.

## 2016-02-22 ENCOUNTER — Other Ambulatory Visit: Payer: Self-pay | Admitting: Family Medicine

## 2016-02-22 DIAGNOSIS — I10 Essential (primary) hypertension: Secondary | ICD-10-CM

## 2016-02-24 ENCOUNTER — Telehealth: Payer: Self-pay | Admitting: Family Medicine

## 2016-02-24 NOTE — Telephone Encounter (Signed)
Relation to ZO:XWRUpt:self Call back number:[phone] [id number] [place] [address] [facility] [address] [address] [phone] (Phone) [phone] (Fax)     Reason for call:  Patient requesting a refill valsartan-hydrochlorothiazide (DIOVAN-HCT) 320-12.5 MG tablet

## 2016-02-25 ENCOUNTER — Other Ambulatory Visit: Payer: Self-pay | Admitting: Emergency Medicine

## 2016-02-25 DIAGNOSIS — I10 Essential (primary) hypertension: Secondary | ICD-10-CM

## 2016-02-25 MED ORDER — VALSARTAN-HYDROCHLOROTHIAZIDE 320-12.5 MG PO TABS
1.0000 | ORAL_TABLET | Freq: Every day | ORAL | 3 refills | Status: DC
Start: 2016-02-25 — End: 2016-09-28

## 2016-02-25 NOTE — Telephone Encounter (Signed)
Received refill request for valsartan-hydrochlorothiazide (DIOVAN-HCT) 320-12.5 MG tablet. Last office visit 12/26/15 and last refill 02/13/15. Refill sent to pharmacy as requested.

## 2016-03-25 ENCOUNTER — Ambulatory Visit: Payer: Self-pay | Admitting: Neurology

## 2016-08-13 ENCOUNTER — Ambulatory Visit (INDEPENDENT_AMBULATORY_CARE_PROVIDER_SITE_OTHER): Payer: Commercial Managed Care - PPO | Admitting: Medical

## 2016-08-13 ENCOUNTER — Encounter: Payer: Self-pay | Admitting: Medical

## 2016-08-13 VITALS — BP 152/102 | HR 98 | Temp 97.7°F | Resp 16 | Ht 71.0 in | Wt 191.0 lb

## 2016-08-13 DIAGNOSIS — W57XXXA Bitten or stung by nonvenomous insect and other nonvenomous arthropods, initial encounter: Secondary | ICD-10-CM

## 2016-08-13 DIAGNOSIS — R21 Rash and other nonspecific skin eruption: Secondary | ICD-10-CM | POA: Diagnosis not present

## 2016-08-13 DIAGNOSIS — S30861A Insect bite (nonvenomous) of abdominal wall, initial encounter: Secondary | ICD-10-CM | POA: Diagnosis not present

## 2016-08-13 DIAGNOSIS — R319 Hematuria, unspecified: Secondary | ICD-10-CM

## 2016-08-13 DIAGNOSIS — R350 Frequency of micturition: Secondary | ICD-10-CM | POA: Diagnosis not present

## 2016-08-13 DIAGNOSIS — M549 Dorsalgia, unspecified: Secondary | ICD-10-CM

## 2016-08-13 DIAGNOSIS — R300 Dysuria: Secondary | ICD-10-CM

## 2016-08-13 DIAGNOSIS — R197 Diarrhea, unspecified: Secondary | ICD-10-CM

## 2016-08-13 LAB — POC URINALSYSI DIPSTICK (AUTOMATED)
BILIRUBIN UA: NEGATIVE
Glucose, UA: NEGATIVE
KETONES UA: NEGATIVE
LEUKOCYTES UA: NEGATIVE
Nitrite, UA: NEGATIVE
Spec Grav, UA: >=1.03 — AB (ref 1.010–1.025)
Urobilinogen, UA: NEGATIVE E.U./dL — AB
pH, UA: 6 (ref 5.0–8.0)

## 2016-08-13 MED ORDER — CIPROFLOXACIN HCL 500 MG PO TABS: 500.0000 mg | ORAL_TABLET | Freq: Two times a day (BID) | ORAL | 0 refills | Status: DC

## 2016-08-13 MED ORDER — HYDROCODONE-ACETAMINOPHEN 5-325 MG PO TABS: 1.0000 | ORAL_TABLET | Freq: Four times a day (QID) | ORAL | 0 refills | Status: DC | PRN

## 2016-08-13 MED ORDER — TRIAMCINOLONE ACETONIDE 0.1 % EX CREA: 1.0000 "application " | TOPICAL_CREAM | Freq: Two times a day (BID) | CUTANEOUS | 0 refills | Status: DC

## 2016-08-13 NOTE — Progress Notes (Signed)
Subjective:    Patient ID: Joel Black, male    DOB: 01/11/1956, 61 y.o.   MRN: [id number]  HPI  Pt in with 3 days of some frequent urination with some pressure and burning at tip of urethra. Back pain/rt side started today. This pain is mild presently. Some fever and chills at time over past day(Subjective fever) some hx of kidney stone. No hx of prostatitis reported.   Pt had some loose stool/diarrhea about 9-10 times toay. Pt did not go out to eat last night. He ate fish last night. Wife had same dish and no diarrhea. Mild nauseu today but not vomting. Pt had not recent antibiotics. Pt states she does have history of some GI issues. He states in past will get loose stools intermittently but not ibs.   Faint rt forearm rash/hyperpigmented area for 6 weeks. No rash. Area does not itch  Also tick bite recent. Removed tick 5 days ago rt flank.(rash did not start afterward) He pulled tick off. Small tick. And estimates just attached for 2-3 before he pulled off.   Review of Systems  Constitutional: Positive for chills and fever. Negative for fatigue.  Respiratory: Negative for cough, chest tightness, shortness of breath and wheezing.   Cardiovascular: Negative for chest pain and palpitations.  Gastrointestinal: Positive for diarrhea. Negative for abdominal distention, abdominal pain, anal bleeding, constipation, nausea and vomiting.  Genitourinary: Positive for dysuria and frequency. Negative for decreased urine volume, hematuria, penile pain, scrotal swelling, testicular pain and urgency.  Musculoskeletal: Positive for back pain. Negative for arthralgias and neck stiffness.       Rt cva very faint pain. But by end of exam pain went away  Skin: Positive for rash.       See hpi.  Neurological: Negative for dizziness, light-headedness and headaches.  Hematological: Negative for adenopathy. Does not bruise/bleed easily.  Psychiatric/Behavioral: Negative for behavioral problems and  confusion.   Past Medical History:  Diagnosis Date  . Herniated cervical disc   . History of kidney stones   . Hypertension   . MRSA (methicillin resistant Staphylococcus aureus)   . Ulcer      Social History   Social History  . Marital status: Married    Spouse name: N/A  . Number of children: N/A  . Years of education: N/A   Occupational History  . artist/sculptor     Social History Main Topics  . Smoking status: Former Smoker    Packs/day: 2.00    Years: 15.00    Types: Cigarettes    Quit date: 06/11/1986  . Smokeless tobacco: Never Used  . Alcohol use 12.6 oz/week    21 Glasses of wine per week     Comment: 3-4 glasses with dinner  . Drug use: No  . Sexual activity: Yes    Partners: Female   Other Topics Concern  . Not on file   Social History Narrative  . No narrative on file    No past surgical history on file.  Family History  Problem Relation Age of Onset  . Cancer Father        lung  . Diabetes Sister     No Known Allergies  Current Outpatient Prescriptions on File Prior to Visit  Medication Sig Dispense Refill  . bisacodyl (DULCOLAX) 5 MG EC tablet Take 5 mg by mouth daily as needed for moderate constipation.    . bismuth subsalicylate (PEPTO BISMOL) 262 MG/15ML suspension Take 30 mLs by mouth every 6 (  six) hours as needed for indigestion.    Marland Kitchen. ibuprofen (ADVIL,MOTRIN) 200 MG tablet Take 400 mg by mouth every 6 (six) hours as needed.    . lovastatin (MEVACOR) 20 MG tablet Take 1 tablet (20 mg total) by mouth at bedtime. 30 tablet 6  . ondansetron (ZOFRAN) 4 MG tablet Take 1 tablet (4 mg total) by mouth every 6 (six) hours. 12 tablet 0  . tamsulosin (FLOMAX) 0.4 MG CAPS capsule Take 0.4 mg by mouth daily as needed (for urinary flow).    . valsartan-hydrochlorothiazide (DIOVAN-HCT) 320-12.5 MG tablet Take 1 tablet by mouth daily. 90 tablet 3   No current facility-administered medications on file prior to visit.     BP (!) 152/102   Pulse 98    Temp 97.7 F (36.5 C) (Oral)   Resp 16   Ht 5\' 11"  (1.803 m)   Wt 191 lb (86.6 kg)   SpO2 98%   BMI 26.64 kg/m       Objective:   Physical Exam  General Appearance- Not in acute distress.  HEENT Eyes- Scleraeral/Conjuntiva-bilat- Not Yellow. Mouth & Throat- Normal.  Chest and Lung Exam Auscultation: Breath sounds:-Normal. Adventitious sounds:- No Adventitious sounds.  Cardiovascular Auscultation:Rythm - Regular. Heart Sounds -Normal heart sounds.  Abdomen Inspection:-Inspection Normal.  Palpation/Perucssion: Palpation and Percussion of the abdomen reveal- Non Tender except faint suprapubic tender on palpation, No Rebound tenderness, No rigidity(Guarding) and No Palpable abdominal masses.  Liver:-Normal.  Spleen:- Normal.   Back- faint rt cva beginning of exam by end of exam pain stopped.  Skin- rt forearm faint 10 cm x 2cm faint  hyperpigmented rash. No warmth. No vesicle. No pain. Skin feel dry and mild rough. Rt iliac crest small mark where tick was. No portion of tick seen. No warmth or tenderness.  Neurologic Cranial Nerve exam:- CN III-XII intact(No nystagmus), symmetric smile. Strength:- 5/5 equal and symmetric strength both upper and lower extremities.       Assessment & Plan:  For your urinary symptoms will get urine culture and psa. Rx cipro antibiotic for possible uti and/or prostatitis.  With your blood in your urine and history of kidney stone, you might need CT renal stone protocol. Since pain is minimal you declined this after discussion. But if pain worsens would recommend the scan. Norco for kidney stone type pain if occurs. Do recommend repeat urine in 2 wks to make sure blood clears completely.  For rash rt forearm present for weeks before tick bite use moisturizer twice daily and will rx kenalog twice daily.  For tick bite just recent with absence of associated tick bite symptoms will put in future tick bite studies to do in 12 days or so.  Studies today would likely be too early and negative.  For loose stools bland diet and immodium. If diarrhea persist then stool panel studies. Unfortunately cipro may loosen stools.  Follow up in 2 weeks or as needed

## 2016-08-13 NOTE — Patient Instructions (Addendum)
For your urinary symptoms will get urine culture and psa. Rx cipro antibiotic for possible uti and/or prostatitis.  With your blood in your urine and history of kidney stone, you might need CT renal stone protocol. Since pain is minimal you declined this after discussion. But if pain worsens would recommend the scan. Norco for kidney stone type pain if occurs. Do recommend repeat urine in 2 wks to make sure blood clears completely.  For rash rt forearm present for weeks before tick bite use moisturizer twice daily and will rx kenalog twice daily.  For tick bite just recent with absence of associated tick bite symptoms will put in future tick bite studies to do in 12 days or so. Studies today would likely be too early and negative.  For loose stools bland diet and immodium. If diarrhea persist then stool panel studies. Unfortunately cipro may loosen stools.  Follow up in 2 weeks or as needed

## 2016-08-14 LAB — CBC WITH DIFFERENTIAL/PLATELET
BASOS PCT: 1 % (ref 0.0–3.0)
Basophils Absolute: 0.1 10*3/uL (ref 0.0–0.1)
EOS ABS: 0.1 10*3/uL (ref 0.0–0.7)
Eosinophils Relative: 0.8 % (ref 0.0–5.0)
HCT: 45.1 % (ref 39.0–52.0)
HEMOGLOBIN: 14.9 g/dL (ref 13.0–17.0)
Lymphocytes Relative: 11.1 % — ABNORMAL LOW (ref 12.0–46.0)
Lymphs Abs: 1.5 10*3/uL (ref 0.7–4.0)
MCHC: 33.2 g/dL (ref 30.0–36.0)
MCV: 97.1 fl (ref 78.0–100.0)
MONO ABS: 1.4 10*3/uL — AB (ref 0.1–1.0)
Monocytes Relative: 10.7 % (ref 3.0–12.0)
Neutro Abs: 10.2 10*3/uL — ABNORMAL HIGH (ref 1.4–7.7)
Neutrophils Relative %: 76.4 % (ref 43.0–77.0)
Platelets: 278 10*3/uL (ref 150.0–400.0)
RBC: 4.64 Mil/uL (ref 4.22–5.81)
RDW: 13.2 % (ref 11.5–15.5)
WBC: 13.4 10*3/uL — AB (ref 4.0–10.5)

## 2016-08-14 LAB — PSA: PSA: 0.83 ng/mL (ref 0.10–4.00)

## 2016-08-14 LAB — URINE CULTURE: ORGANISM ID, BACTERIA: NO GROWTH

## 2016-08-21 ENCOUNTER — Telehealth: Payer: Self-pay | Admitting: Medical

## 2016-08-21 ENCOUNTER — Telehealth: Payer: Self-pay | Admitting: Family Medicine

## 2016-08-21 NOTE — Telephone Encounter (Signed)
I saw this call about getting worse/recurrent symptoms after hours on Friday. Tried to call  Friday at 5:20 before I left office but no answer. I saw him on 08-13-2016. Will you call him on Monday am and see how he is. Can he get in with somebody on Monday.  Let him know timing of my call and saw message after hours. Busy during the day and could not call earlier.

## 2016-08-21 NOTE — Telephone Encounter (Signed)
Caller name: Renae Fickleaul  Relation to ZO:XWRUpt:self Call back number: [phone] [id number] [place] [address]  Reason for call: Pt called stating was seen in our office for a UTI on 08-13-2016 and was given rx to take, pt states it took 3 to 4 days to start getting well but the the irritation is starting again like the first day, like a day or two ago. Pt would like to know if he can get something that can help him out since has left less medication (only til Sunday) and he is not getting well. Please advise ASAP.

## 2016-08-21 NOTE — Telephone Encounter (Signed)
error 

## 2016-08-24 NOTE — Telephone Encounter (Signed)
See other phone note 08/21/16.

## 2016-08-24 NOTE — Telephone Encounter (Signed)
Called to follow up patient. He stated that he had passed a huge kidney stone this weekend and pain has gone away.  He does feel tired/lack energy, but otherwise feels better.  Offered him an appt.  Pt stated that he will just keep his appt as scheduled with Edward for 08/26/16 at 9 am.

## 2016-08-26 ENCOUNTER — Ambulatory Visit (INDEPENDENT_AMBULATORY_CARE_PROVIDER_SITE_OTHER): Payer: Commercial Managed Care - PPO | Admitting: Medical

## 2016-08-26 ENCOUNTER — Telehealth: Payer: Self-pay | Admitting: Medical

## 2016-08-26 VITALS — BP 128/83 | HR 79 | Temp 97.9°F | Resp 16 | Ht 71.0 in | Wt 190.0 lb

## 2016-08-26 DIAGNOSIS — N200 Calculus of kidney: Secondary | ICD-10-CM

## 2016-08-26 DIAGNOSIS — Z1211 Encounter for screening for malignant neoplasm of colon: Secondary | ICD-10-CM

## 2016-08-26 DIAGNOSIS — E538 Deficiency of other specified B group vitamins: Secondary | ICD-10-CM

## 2016-08-26 DIAGNOSIS — R252 Cramp and spasm: Secondary | ICD-10-CM | POA: Diagnosis not present

## 2016-08-26 DIAGNOSIS — R110 Nausea: Secondary | ICD-10-CM

## 2016-08-26 DIAGNOSIS — R5383 Other fatigue: Secondary | ICD-10-CM

## 2016-08-26 DIAGNOSIS — W57XXXA Bitten or stung by nonvenomous insect and other nonvenomous arthropods, initial encounter: Secondary | ICD-10-CM

## 2016-08-26 LAB — VITAMIN B12: VITAMIN B 12: 157 pg/mL — AB (ref 211–911)

## 2016-08-26 LAB — COMPREHENSIVE METABOLIC PANEL
ALT: 28 U/L (ref 0–53)
AST: 18 U/L (ref 0–37)
Albumin: 4.4 g/dL (ref 3.5–5.2)
Alkaline Phosphatase: 56 U/L (ref 39–117)
BILIRUBIN TOTAL: 0.8 mg/dL (ref 0.2–1.2)
BUN: 19 mg/dL (ref 6–23)
CHLORIDE: 102 meq/L (ref 96–112)
CO2: 29 meq/L (ref 19–32)
Calcium: 9.4 mg/dL (ref 8.4–10.5)
Creatinine, Ser: 1.15 mg/dL (ref 0.40–1.50)
GFR: 68.22 mL/min (ref >60.00)
Glucose, Bld: 95 mg/dL (ref 70–99)
Potassium: 3.7 mEq/L (ref 3.5–5.1)
Sodium: 137 mEq/L (ref 135–145)
Total Protein: 7.2 g/dL (ref 6.0–8.3)

## 2016-08-26 LAB — POC URINALSYSI DIPSTICK (AUTOMATED)
BILIRUBIN UA: NEGATIVE
GLUCOSE UA: NEGATIVE
Leukocytes, UA: NEGATIVE
NITRITE UA: NEGATIVE
Protein, UA: NEGATIVE
RBC UA: NEGATIVE
Spec Grav, UA: 1.025 (ref 1.010–1.025)
Urobilinogen, UA: 0.2 E.U./dL
pH, UA: 6 (ref 5.0–8.0)

## 2016-08-26 LAB — CBC WITH DIFFERENTIAL/PLATELET
BASOS ABS: 0.1 10*3/uL (ref 0.0–0.1)
BASOS PCT: 1 % (ref 0.0–3.0)
Eosinophils Absolute: 0.3 10*3/uL (ref 0.0–0.7)
Eosinophils Relative: 4.1 % (ref 0.0–5.0)
HEMATOCRIT: 43.8 % (ref 39.0–52.0)
HEMOGLOBIN: 14.8 g/dL (ref 13.0–17.0)
LYMPHS PCT: 37.6 % (ref 12.0–46.0)
Lymphs Abs: 2.4 10*3/uL (ref 0.7–4.0)
MCHC: 33.7 g/dL (ref 30.0–36.0)
MCV: 97 fl (ref 78.0–100.0)
MONOS PCT: 10.2 % (ref 3.0–12.0)
Monocytes Absolute: 0.6 10*3/uL (ref 0.1–1.0)
NEUTROS ABS: 3 10*3/uL (ref 1.4–7.7)
Neutrophils Relative %: 47.1 % (ref 43.0–77.0)
PLATELETS: 258 10*3/uL (ref 150.0–400.0)
RBC: 4.52 Mil/uL (ref 4.22–5.81)
RDW: 12.8 % (ref 11.5–15.5)
WBC: 6.3 10*3/uL (ref 4.0–10.5)

## 2016-08-26 LAB — MAGNESIUM: MAGNESIUM: 2 mg/dL (ref 1.5–2.5)

## 2016-08-26 LAB — TSH: TSH: 2.22 u[IU]/mL (ref 0.35–4.50)

## 2016-08-26 NOTE — Patient Instructions (Addendum)
For your kidney stone will get stone analysis and follow the results. Will get urine test today and see if blood present. May need culture as well. Might need to refer you to urologist.  For fatigue and nausea will get cbc,cmp, b12, vit d and tsh today. Assess infection fighting cells and evaluate kidney function as well.  I put in referral to GI for screening colonosocpy  For muscle cramps magnesium level.  Follow up date to be determined.

## 2016-08-26 NOTE — Progress Notes (Signed)
Subjective:    Patient ID: Joel Black, male    DOB: 03/06/1955, 61 y.o.   MRN: [id number]  HPI  Pt in for follow up.  Pt states since I last saw him he passed kidney stone. He states pain in rt cva area did get better. But about one week later he developed rt flank pain and bladder area pain. Then on passed Friday he passed a large kidney stone. He had known history of large stone rt side before recent passing.  Pt stone he passed and brings in 7 mm x 5 mm.  Pt states over the weekend some fatigue and nausea. No vomiting.Mild upset stomach. No diarrhea. Wife had mild nausea recently with mild upset but no vomiting. Wife has no diarrhea.  Pt has no fever, no chills and no sweats.   Review of Systems  Constitutional: Positive for fatigue. Negative for chills and fever.  HENT: Negative for congestion, dental problem and ear pain.   Respiratory: Negative for cough, chest tightness, shortness of breath and wheezing.   Cardiovascular: Negative for chest pain and palpitations.  Gastrointestinal: Positive for nausea. Negative for abdominal pain, blood in stool, constipation, diarrhea and vomiting.  Genitourinary: Negative for decreased urine volume, difficulty urinating, dysuria, flank pain, frequency, hematuria, penile pain, penile swelling and testicular pain.  Musculoskeletal: Negative for arthralgias, back pain, gait problem, joint swelling and myalgias.  Skin: Negative for color change, pallor, rash and wound.  Neurological: Negative for dizziness, syncope, speech difficulty, weakness, numbness and headaches.  Hematological: Negative for adenopathy. Does not bruise/bleed easily.  Psychiatric/Behavioral: Negative for behavioral problems, confusion and self-injury. The patient is not nervous/anxious.     Past Medical History:  Diagnosis Date  . Herniated cervical disc   . History of kidney stones   . Hypertension   . MRSA (methicillin resistant Staphylococcus aureus)   .  Ulcer      Social History   Social History  . Marital status: Married    Spouse name: N/A  . Number of children: N/A  . Years of education: N/A   Occupational History  . artist/sculptor     Social History Main Topics  . Smoking status: Former Smoker    Packs/day: 2.00    Years: 15.00    Types: Cigarettes    Quit date: 06/11/1986  . Smokeless tobacco: Never Used  . Alcohol use 12.6 oz/week    21 Glasses of wine per week     Comment: 3-4 glasses with dinner  . Drug use: No  . Sexual activity: Yes    Partners: Female   Other Topics Concern  . Not on file   Social History Narrative  . No narrative on file    No past surgical history on file.  Family History  Problem Relation Age of Onset  . Cancer Father        lung  . Diabetes Sister     No Known Allergies  Current Outpatient Prescriptions on File Prior to Visit  Medication Sig Dispense Refill  . bismuth subsalicylate (PEPTO BISMOL) 262 MG/15ML suspension Take 30 mLs by mouth every 6 (six) hours as needed for indigestion.    Marland Kitchen. HYDROcodone-acetaminophen (NORCO) 5-325 MG tablet Take 1 tablet by mouth every 6 (six) hours as needed for moderate pain. 16 tablet 0  . lovastatin (MEVACOR) 20 MG tablet Take 1 tablet (20 mg total) by mouth at bedtime. 30 tablet 6  . ondansetron (ZOFRAN) 4 MG tablet Take 1 tablet (4 mg  total) by mouth every 6 (six) hours. 12 tablet 0  . triamcinolone cream (KENALOG) 0.1 % Apply 1 application topically 2 (two) times daily. 30 g 0  . valsartan-hydrochlorothiazide (DIOVAN-HCT) 320-12.5 MG tablet Take 1 tablet by mouth daily. 90 tablet 3   No current facility-administered medications on file prior to visit.     BP 128/83   Pulse 79   Temp 97.9 F (36.6 C)   Resp 16   Ht 5\' 11"  (1.803 m)   Wt 190 lb (86.2 kg)   SpO2 98%   BMI 26.50 kg/m       Objective:   Physical Exam  General Appearance- Not in acute distress.  HEENT Eyes- Scleraeral/Conjuntiva-bilat- Not Yellow. Mouth &  Throat- Normal.  Chest and Lung Exam Auscultation: Breath sounds:-Normal. Adventitious sounds:- No Adventitious sounds.  Cardiovascular Auscultation:Rythm - Regular. Heart Sounds -Normal heart sounds.  Abdomen Inspection:-Inspection Normal.  Palpation/Perucssion: Palpation and Percussion of the abdomen reveal- Non Tender, No Rebound tenderness, No rigidity(Guarding) and No Palpable abdominal masses.  Liver:-Normal.  Spleen:- Normal.   Back- no cva area pain.      Assessment & Plan:  For your kidney stone will get stone analysis and follow the results. Will get urine test today and see if blood present. May need culture as well. Might need to refer you to urologist.  For fatigue and nausea will get cbc,cmp, tsh and b12 vitamin d today. Assess infection fighting cells and evaluate kidney function as well.  For muscle cramps magnesium level  I put in referral to GI for screening colonsocpy  Follow up date to be determined.  Birgitta Uhlir, Ramon Dredge, PA-C   If blood present in urine discussed with pt maybe get ct renal stone protocol.  Asked jasmine to notify lab staff to do tick bite studies based on his hx of tick bite. See last note.

## 2016-08-26 NOTE — Telephone Encounter (Signed)
Tried to order future b12 but was unable to. MA Jasmine will arrange for pt to come in for injection.

## 2016-08-27 ENCOUNTER — Telehealth: Payer: Self-pay | Admitting: Medical

## 2016-08-27 LAB — LYME AB/WESTERN BLOT REFLEX: B burgdorferi Ab IgG+IgM: <0.9 Index (ref <0.90)

## 2016-08-27 LAB — ROCKY MTN SPOTTED FVR ABS PNL(IGG+IGM)
RMSF IGG: NOT DETECTED
RMSF IGM: NOT DETECTED

## 2016-08-27 NOTE — Telephone Encounter (Signed)
Pt insurance does not cover b12 injections. Pt states he need to know how much injection and nurse visit will cost before he decides to get injection.

## 2016-08-27 NOTE — Telephone Encounter (Signed)
Did you find out how much b-12 injection would cost pt. Someone sent me a note stating he was scheduled already? So does pt know cost? Is it reasonable.

## 2016-08-28 ENCOUNTER — Ambulatory Visit: Payer: Commercial Managed Care - PPO

## 2016-08-28 ENCOUNTER — Telehealth: Payer: Self-pay | Admitting: Medical

## 2016-08-28 DIAGNOSIS — E538 Deficiency of other specified B group vitamins: Secondary | ICD-10-CM

## 2016-08-28 NOTE — Telephone Encounter (Signed)
Repeat b12 level in one month. Will put in future order.

## 2016-08-28 NOTE — Telephone Encounter (Signed)
Pt states he will just get otc b12 because he can't afford injection. When should he come back to recheck labs?

## 2016-08-29 LAB — STONE ANALYSIS: Stone Weight KSTONE: 0.074 g

## 2016-08-30 LAB — VITAMIN D 1,25 DIHYDROXY
Vitamin D 1, 25 (OH)2 Total: 24 pg/mL (ref 18–72)
Vitamin D2 1, 25 (OH)2: <8 pg/mL
Vitamin D3 1, 25 (OH)2: 24 pg/mL

## 2016-09-28 ENCOUNTER — Telehealth: Payer: Self-pay | Admitting: Family Medicine

## 2016-09-28 MED ORDER — LOSARTAN POTASSIUM-HCTZ 100-12.5 MG PO TABS: 1.0000 | ORAL_TABLET | Freq: Every day | ORAL | 3 refills | Status: DC

## 2016-09-28 NOTE — Telephone Encounter (Signed)
Pt would like to know if PCP could change his Rx? He said that the price is increasing and it is getting more and more expensive  Please advise.   CB: [phone]

## 2016-09-28 NOTE — Telephone Encounter (Signed)
Called pt- he needs to replace the valsartan/ hctz.  Sent in rx for losartan/ hctz  Meds ordered this encounter  Medications  . losartan-hydrochlorothiazide (HYZAAR) 100-12.5 MG tablet    Sig: Take 1 tablet by mouth daily.    Dispense:  90 tablet    Refill:  3

## 2016-10-28 ENCOUNTER — Encounter: Payer: Self-pay | Admitting: Medical

## 2017-01-06 ENCOUNTER — Ambulatory Visit: Payer: Commercial Managed Care - PPO | Admitting: Medical

## 2017-01-06 ENCOUNTER — Ambulatory Visit: Payer: Self-pay

## 2017-01-06 NOTE — Telephone Encounter (Signed)
Phone call from pt. with report of hx of cold symptoms about 3-4 weeks ago.  Reported he started to feel better for a week, then went [place], and began feeling ill again about 12/10.  Today, c/o fatigue, and shortness of breath.  Reports dry cough, nasal congestion with white mucus, soreness in muscles of bilateral neck. Reported has been taking Thera-flu, Cold Eez, and Ibuprofen.  Also using an Albuterol Inhaler that is about 63 years old.   Attempted to schedule an appt. today, but unable to meet the co-pay requirement.  Scheduled appt. For 12/20.  Care advice per protocol.  Encouraged to add humidifier at the bedside.  Advised to go to ER if shortness of breath becomes severe. Verb. Understanding.    Reason for Disposition . [1] MILD difficulty breathing (e.g., minimal/no SOB at rest, SOB with walking, pulse <100) AND [2] NEW-onset or WORSE than normal  Answer Assessment - Initial Assessment Questions 1. RESPIRATORY STATUS: "Describe your breathing?" (e.g., wheezing, shortness of breath, unable to speak, severe coughing)      Shortness of breath with activity 2. ONSET: "When did this breathing problem begin?"      Started after he went on a cruise, about 4 weeks ago.  3. PATTERN "Does the difficult breathing come and go, or has it been constant since it started?"      Shortness of breath comes and goes with activity  4. SEVERITY: "How bad is your breathing?" (e.g., mild, moderate, severe)    - MILD: No SOB at rest, mild SOB with walking, speaks normally in sentences, can lay down, no retractions, pulse < 100.    - MODERATE: SOB at rest, SOB with minimal exertion and prefers to sit, cannot lie down flat, speaks in phrases, mild retractions, audible wheezing, pulse 100-120.    - SEVERE: Very SOB at rest, speaks in single words, struggling to breathe, sitting hunched forward, retractions, pulse > 120     moderate 5. RECURRENT SYMPTOM: "Have you had difficulty breathing before?"  If so, ask: "When was the last time?" and "What happened that time?"      Hx of pneumonia a few years ago 6. CARDIAC HISTORY: "Do you have any history of heart disease?" (e.g., heart attack, angina, bypass surgery, angioplasty)     No heart  7. LUNG HISTORY: "Do you have any history of lung disease?"  (e.g., pulmonary embolus, asthma, emphysema)     None of above 8. CAUSE: "What do you think is causing the breathing problem?"      Had cold about 4 weeks ago, got better, now recurring 9. OTHER SYMPTOMS: "Do you have any other symptoms? (e.g., dizziness, runny nose, cough, chest pain, fever)     Sinus congestion/ white mucus , tearing of eyes; stiffness in muscular portion of neck and shoulders for a few days; mild headache; denies chest pain; a little cough; "more of an irritation"   10. PREGNANCY: "Is there any chance you are pregnant?" "When was your last menstrual period?"       N/A 11. TRAVEL: "Have you traveled out of the country in the last month?" (e.g., travel history, exposures)       [place] within last month. (12/8)  Protocols used: BREATHING DIFFICULTY-A-AH

## 2017-01-07 ENCOUNTER — Encounter: Payer: Self-pay | Admitting: Family Medicine

## 2017-01-07 ENCOUNTER — Ambulatory Visit (INDEPENDENT_AMBULATORY_CARE_PROVIDER_SITE_OTHER): Payer: Commercial Managed Care - PPO | Admitting: Family Medicine

## 2017-01-07 ENCOUNTER — Ambulatory Visit (HOSPITAL_BASED_OUTPATIENT_CLINIC_OR_DEPARTMENT_OTHER)
Admission: RE | Admit: 2017-01-07 | Discharge: 2017-01-07 | Disposition: A | Discharge status: Home / Self Care | Payer: Commercial Managed Care - PPO | Source: Ambulatory Visit | Attending: Family Medicine | Admitting: Family Medicine

## 2017-01-07 VITALS — BP 132/85 | HR 86 | Temp 98.1°F | Resp 16 | Ht 71.0 in | Wt 187.4 lb

## 2017-01-07 DIAGNOSIS — R05 Cough: Secondary | ICD-10-CM | POA: Insufficient documentation

## 2017-01-07 DIAGNOSIS — M720 Palmar fascial fibromatosis [Dupuytren]: Secondary | ICD-10-CM

## 2017-01-07 DIAGNOSIS — J40 Bronchitis, not specified as acute or chronic: Secondary | ICD-10-CM | POA: Diagnosis not present

## 2017-01-07 DIAGNOSIS — R0989 Other specified symptoms and signs involving the circulatory and respiratory systems: Secondary | ICD-10-CM

## 2017-01-07 MED ORDER — DOXYCYCLINE HYCLATE 100 MG PO CAPS: 100.0000 mg | ORAL_CAPSULE | Freq: Two times a day (BID) | ORAL | 0 refills | Status: DC

## 2017-01-07 NOTE — Patient Instructions (Signed)
Good to see you - we are going to treat you for bronchitis with doxycycline antibiotic Take this twice a day for 10 days Please let me know if you are not improving over the next few days  It looks like you have a Dupeytren's contracture in your left hand, or an issue with the flexor tendon to the pinky finger.  If you wish, I am glad to refer your to hand surgery for evaluation

## 2017-01-07 NOTE — Progress Notes (Signed)
[facility] [address], [address] [place], [place] [address] [phone] [phone]  Date:  01/07/2017   Name:  Joel Black   DOB:  06/22/1955   MRN:  [id number]  PCP:  Pearline Cablesopland, Kaizlee Carlino C, MD    Chief Complaint: No chief complaint on file.   History of Present Illness:  Joel Black is a 61 y.o. very pleasant male patient who presents with the following:  About a month ago he noted a cold- this started to get better, and they went on a [place] About 10 days ago he drove home after the trip- by the time he got home he was not feeling that well He noted sx of fatigue, no energy, body aches, sore neck, phlegm in his chest and sinuses Last night he blew his nose and got a lot of mucus out No GI symptoms- no nausea, vomiting or diarrhea  Body aches are resolving  He has not noted any fever This does remind him of when he had pneumonia  He is on hyzaar and lovastatin   BP Readings from Last 3 Encounters:  01/07/17 132/85  08/26/16 128/83  08/13/16 (!) 152/102    Patient Active Problem List   Diagnosis Date Noted  . Atypical chest pain 09/21/2015  . BRBPR (bright red blood per rectum)   . Stenosis, cervical spine 02/13/2015  . Shortness of breath 03/19/2014  . Solitary pulmonary nodule on lung CT 02/18/2014  . HTN (hypertension) 10/21/2012    Past Medical History:  Diagnosis Date  . Herniated cervical disc   . History of kidney stones   . Hypertension   . MRSA (methicillin resistant Staphylococcus aureus)   . Ulcer     No past surgical history on file.  Social History   Tobacco Use  . Smoking status: Former Smoker    Packs/day: 2.00    Years: 15.00    Pack years: 30.00    Types: Cigarettes    Last attempt to quit: 06/11/1986    Years since quitting: 30.5  . Smokeless tobacco: Never Used  Substance Use Topics  . Alcohol use: Yes    Alcohol/week: 12.6 oz    Types: 21 Glasses  of wine per week    Comment: 3-4 glasses with dinner  . Drug use: No    Family History  Problem Relation Age of Onset  . Cancer Father        lung  . Diabetes Sister     No Known Allergies  Medication list has been reviewed and updated.  Current Outpatient Medications on File Prior to Visit  Medication Sig Dispense Refill  . losartan-hydrochlorothiazide (HYZAAR) 100-12.5 MG tablet Take 1 tablet by mouth daily. 90 tablet 3  . lovastatin (MEVACOR) 20 MG tablet Take 1 tablet (20 mg total) by mouth at bedtime. 30 tablet 6  . ondansetron (ZOFRAN) 4 MG tablet Take 1 tablet (4 mg total) by mouth every 6 (six) hours. 12 tablet 0   No current facility-administered medications on file prior to visit.     Review of Systems:  As per HPI- otherwise negative.   Physical Examination: Vitals:   01/07/17 1444 01/07/17 1508  BP: (!) 139/101 132/85  Pulse: 86   Resp: 16   Temp: 98.1 F (36.7 C)   SpO2: 97%    Vitals:   01/07/17 1444  Weight: 187 lb 6.4 oz (85 kg)  Height: 5\' 11"  (1.803 m)  Body mass index is 26.14 kg/m. Ideal Body Weight: Weight in (lb) to have BMI = 25: 178.9  GEN: WDWN, NAD, Non-toxic, A & O x 3, normal weight, looks well HEENT: Atraumatic, Normocephalic. Neck supple. No masses, No LAD.  Bilateral TM wnl, oropharynx normal.  PEERL,EOMI.   Ears and Nose: No external deformity. CV: RRR, No M/G/R. No JVD. No thrill. No extra heart sounds. PULM: CTA B, no wheezes, crackles, rhonchi. No retractions. No resp. distress. No accessory muscle use. ABD: S, NT, ND EXTR: No c/c/e NEURO Normal gait.  PSYCH: Normally interactive. Conversant. Not depressed or anxious appearing.  Calm demeanor.  Left hand- he has a contracture of the 5th finger- this has been present for about 2 years per his report  He is not aware of any acute injury  Dg Chest 2 View  Result Date: 01/07/2017 CLINICAL DATA:  Cough. EXAM: CHEST  2 VIEW COMPARISON:  Radiographs of September 21, 2015.  FINDINGS: The heart size and mediastinal contours are within normal limits. Both lungs are clear. No pneumothorax or pleural effusion is noted. The visualized skeletal structures are unremarkable. IMPRESSION: No active cardiopulmonary disease. Electronically Signed   By: Lupita RaiderJames  Green Jr, M.D.   On: 01/07/2017 15:42    Assessment and Plan: Chest congestion - Plan: DG Chest 2 View  Bronchitis - Plan: doxycycline (VIBRAMYCIN) 100 MG capsule  Dupuytren's contracture of left hand  Here today with cough and chest congestion for about 10 days History of pneumonia in the past CXR clear today Will treat with doxycycline today He will let me know if not feeling better in the next few days- Sooner if worse.  For the time being he does not want a referral to hand surgery as his hand does not affect his work, but he will let me know if he changes his mind   Signed Abbe AmsterdamJessica Jasiel Belisle, MD

## 2017-07-23 ENCOUNTER — Ambulatory Visit: Payer: Self-pay | Admitting: *Deleted

## 2017-07-23 NOTE — Telephone Encounter (Signed)
Pt states he has experiencing dizzy spells for the past few weeks.Pt states that his symptoms did go away but came back this week. Pt states that he feels light headed with changing head positions and also with standing. Pt reports that when going from a lying to sitting position, he feels like he could fall over at times.  Pt reports that has not had any changes in his medications recently and has been staying adequately hydrated. Pt reports that his wife is a physical therapist and did try to help him with exercises to reduce dizziness but pt did not notice any improvement. Pt states that his wife did check his BP yesterday and it was noted to be WNL. Pt also voices concern of experiencing severe foot cramps for the past month that usually happen at night. Pt also states that he removed 2 ticks from his legs a couple of weeks ago but has not experienced any rash, headaches or other symptoms. Pt scheduled for appt on Sat. Pt advised if symptoms become worse before scheduled appt to seek treatment at Urgent Care/ED or to call the office back. Pt verbalized understanding.  Reason for Disposition . [1] MODERATE dizziness (e.g., interferes with normal activities) AND [2] has NOT been evaluated by physician for this  (Exception: dizziness caused by heat exposure, sudden standing, or poor fluid intake)  Answer Assessment - Initial Assessment Questions 1. DESCRIPTION: "Describe your dizziness."     Feels lightheaded 2. LIGHTHEADED: "Do you feel lightheaded?" (e.g., somewhat faint, woozy, weak upon standing)     Yes 3. VERTIGO: "Do you feel like either you or the room is spinning or tilting?" (i.e. vertigo)     No not at this time 4. SEVERITY: "How bad is it?"  "Do you feel like you are going to faint?" "Can you stand and walk?"   - MILD - walking normally   - MODERATE - interferes with normal activities (e.g., work, school)    - SEVERE - unable to stand, requires support to walk, feels like passing out now.      Moderate, when going from a lying to sitting position, pt states at times he feels like he is going to fall over I 5. ONSET:  "When did the dizziness begin?"     A few weeks ago 6. AGGRAVATING FACTORS: "Does anything make it worse?" (e.g., standing, change in head position)     Changing head position and standing 7. HEART RATE: "Can you tell me your heart rate?" "How many beats in 15 seconds?"  (Note: not all patients can do this)       Pt does not know how to check heartrate 8. CAUSE: "What do you think is causing the dizziness?"     unsure 9. RECURRENT SYMPTOM: "Have you had dizziness before?" If so, ask: "When was the last time?" "What happened that time?"     no 10. OTHER SYMPTOMS: "Do you have any other symptoms?" (e.g., fever, chest pain, vomiting, diarrhea, bleeding)       Just a little fatigued no other symptoms  Protocols used: DIZZINESS Tereasa Coop- LIGHTHEADEDNESS-A-AH

## 2017-07-24 ENCOUNTER — Ambulatory Visit (INDEPENDENT_AMBULATORY_CARE_PROVIDER_SITE_OTHER): Payer: Commercial Managed Care - PPO | Admitting: Family Medicine

## 2017-07-24 ENCOUNTER — Other Ambulatory Visit: Payer: Self-pay

## 2017-07-24 ENCOUNTER — Encounter: Payer: Self-pay | Admitting: Family Medicine

## 2017-07-24 VITALS — BP 128/84 | HR 71 | Temp 98.2°F | Resp 16 | Ht 71.0 in | Wt 184.0 lb

## 2017-07-24 DIAGNOSIS — M791 Myalgia, unspecified site: Secondary | ICD-10-CM | POA: Diagnosis not present

## 2017-07-24 DIAGNOSIS — R5383 Other fatigue: Secondary | ICD-10-CM

## 2017-07-24 DIAGNOSIS — R350 Frequency of micturition: Secondary | ICD-10-CM

## 2017-07-24 DIAGNOSIS — I10 Essential (primary) hypertension: Secondary | ICD-10-CM

## 2017-07-24 DIAGNOSIS — R42 Dizziness and giddiness: Secondary | ICD-10-CM | POA: Insufficient documentation

## 2017-07-24 DIAGNOSIS — M2550 Pain in unspecified joint: Secondary | ICD-10-CM | POA: Diagnosis not present

## 2017-07-24 MED ORDER — MECLIZINE HCL 25 MG PO TABS: 25.0000 mg | ORAL_TABLET | Freq: Three times a day (TID) | ORAL | 0 refills | Status: DC | PRN

## 2017-07-24 NOTE — Patient Instructions (Addendum)
64 oz of clear fluids daily, for any caffeinated or alcohoic beverage need an extra glass of fluids.  Hydrate throughout the day.  Hyland's leg cramp medicine for cramping. Can try massage of the muscle lidocaine gel by [employer], Icy [employer] and Salon Pas     Vertigo Vertigo means that you feel like you are moving when you are not. Vertigo can also make you feel like things around you are moving when they are not. This feeling can come and go at any time. Vertigo often goes away on its own. Follow these instructions at home:  Avoid making fast movements.  Avoid driving.  Avoid using heavy machinery.  Avoid doing any task or activity that might cause danger to you or other people if you would have a vertigo attack while you are doing it.  Sit down right away if you feel dizzy or have trouble with your balance.  Take over-the-counter and prescription medicines only as told by your doctor.  Follow instructions from your doctor about which positions or movements you should avoid.  Drink enough fluid to keep your pee (urine) clear or pale yellow.  Keep all follow-up visits as told by your doctor. This is important. Contact a doctor if:  Medicine does not help your vertigo.  You have a fever.  Your problems get worse or you have new symptoms.  Your family or friends see changes in your behavior.  You feel sick to your stomach (nauseous) or you throw up (vomit).  You have a "pins and needles" feeling or you are numb in part of your body. Get help right away if:  You have trouble moving or talking.  You are always dizzy.  You pass out (faint).  You get very bad headaches.  You feel weak or have trouble using your hands, arms, or legs.  You have changes in your hearing.  You have changes in your seeing (vision).  You get a stiff neck.  Bright light starts to bother you. This information is not intended to replace advice given to you by your health care provider. Make  sure you discuss any questions you have with your health care provider. Document Released: 10/15/2007 Document Revised: 06/13/2015 Document Reviewed: 04/30/2014 Elsevier Interactive Patient Education  [employer]2018 Elsevier Inc.

## 2017-07-24 NOTE — Progress Notes (Signed)
Subjective:    Patient ID: Joel Black, male    DOB: 07-16-1955, 62 y.o.   MRN: [id number]  Chief Complaint  Patient presents with  . Dizziness    HPI Patient is in today for evaluation of vertigo. Symptoms have been notable for a couple of days. has been worst at night when he moves quickly he has a sense of everything spinning.Does not drink a significant amount of fluids each day. Patient walks in the woods with his dog frequently and has had several tick bites and is worried about Lyme disease which he has had before. Notes myalgias, arthralgias, fatigue. Denies CP/palp/SOB/HA/congestion/fevers/GI or GU c/o. Taking meds as prescribed  Past Medical History:  Diagnosis Date  . Herniated cervical disc   . History of kidney stones   . Hypertension   . MRSA (methicillin resistant Staphylococcus aureus)   . Ulcer     History reviewed. No pertinent surgical history.  Family History  Problem Relation Age of Onset  . Cancer Father        lung  . Diabetes Sister     Social History   Socioeconomic History  . Marital status: Married    Spouse name: Not on file  . Number of children: Not on file  . Years of education: Not on file  . Highest education level: Not on file  Occupational History  . Occupation: Manufacturing engineer   Social Needs  . Financial resource strain: Not on file  . Food insecurity:    Worry: Not on file    Inability: Not on file  . Transportation needs:    Medical: Not on file    Non-medical: Not on file  Tobacco Use  . Smoking status: Former Smoker    Packs/day: 2.00    Years: 15.00    Pack years: 30.00    Types: Cigarettes    Last attempt to quit: 06/11/1986    Years since quitting: 31.1  . Smokeless tobacco: Never Used  Substance and Sexual Activity  . Alcohol use: Yes    Alcohol/week: 12.6 oz    Types: 21 Glasses of wine per week    Comment: 3-4 glasses with dinner  . Drug use: No  . Sexual activity: Yes    Partners: Female    Lifestyle  . Physical activity:    Days per week: Not on file    Minutes per session: Not on file  . Stress: Not on file  Relationships  . Social connections:    Talks on phone: Not on file    Gets together: Not on file    Attends religious service: Not on file    Active member of club or organization: Not on file    Attends meetings of clubs or organizations: Not on file    Relationship status: Not on file  . Intimate partner violence:    Fear of current or ex partner: Not on file    Emotionally abused: Not on file    Physically abused: Not on file    Forced sexual activity: Not on file  Other Topics Concern  . Not on file  Social History Narrative  . Not on file    Outpatient Medications Prior to Visit  Medication Sig Dispense Refill  . cholecalciferol (VITAMIN D) 1000 units tablet Take 1,000 Units by mouth daily.    Marland Kitchen losartan-hydrochlorothiazide (HYZAAR) 100-12.5 MG tablet Take 1 tablet by mouth daily. 90 tablet 3  . doxycycline (VIBRAMYCIN) 100 MG capsule Take 1 capsule (  100 mg total) by mouth 2 (two) times daily. 20 capsule 0  . lovastatin (MEVACOR) 20 MG tablet Take 1 tablet (20 mg total) by mouth at bedtime. (Patient not taking: Reported on 07/24/2017) 30 tablet 6  . ondansetron (ZOFRAN) 4 MG tablet Take 1 tablet (4 mg total) by mouth every 6 (six) hours. 12 tablet 0   No facility-administered medications prior to visit.     No Known Allergies  Review of Systems  Constitutional: Positive for malaise/fatigue. Negative for fever.  HENT: Negative for congestion.   Eyes: Negative for blurred vision.  Respiratory: Negative for shortness of breath.   Cardiovascular: Negative for chest pain, palpitations and leg swelling.  Gastrointestinal: Negative for abdominal pain, blood in stool and nausea.  Genitourinary: Negative for dysuria and frequency.  Musculoskeletal: Positive for joint pain and myalgias. Negative for falls.  Skin: Negative for rash.  Neurological:  Positive for dizziness. Negative for sensory change, loss of consciousness, weakness and headaches.  Endo/Heme/Allergies: Negative for environmental allergies.  Psychiatric/Behavioral: Negative for depression. The patient is not nervous/anxious.        Objective:    Physical Exam  Constitutional: He is oriented to person, place, and time. He appears well-developed and well-nourished. No distress.  HENT:  Head: Normocephalic and atraumatic.  Nose: Nose normal.  Eyes: Right eye exhibits no discharge. Left eye exhibits no discharge.  Neck: Normal range of motion. Neck supple.  Cardiovascular: Normal rate and regular rhythm.  No murmur heard. Pulmonary/Chest: Effort normal and breath sounds normal.  Abdominal: Soft. Bowel sounds are normal. There is no tenderness.  Musculoskeletal: He exhibits no edema.  Neurological: He is alert and oriented to person, place, and time. He displays normal reflexes. No cranial nerve deficit or sensory deficit. He exhibits normal muscle tone. Coordination normal.  2 beats of nystagmus with leftward gaze, 1 beat with upward gaze.   Skin: Skin is warm and dry.  Psychiatric: He has a normal mood and affect.  Nursing note and vitals reviewed.   BP 128/84   Pulse 71   Temp 98.2 F (36.8 C) (Oral)   Resp 16   Ht '5\' 11"'  (1.803 m)   Wt 184 lb (83.5 kg)   SpO2 98%   BMI 25.66 kg/m  Wt Readings from Last 3 Encounters:  07/24/17 184 lb (83.5 kg)  01/07/17 187 lb 6.4 oz (85 kg)  08/26/16 190 lb (86.2 kg)     Lab Results  Component Value Date   WBC 6.3 08/26/2016   HGB 14.8 08/26/2016   HCT 43.8 08/26/2016   PLT 258.0 08/26/2016   GLUCOSE 95 08/26/2016   CHOL 246 (H) 09/21/2015   TRIG 181 (H) 09/21/2015   HDL 53 09/21/2015   LDLDIRECT 157 (H) 02/13/2015   LDLCALC 157 (H) 09/21/2015   ALT 28 08/26/2016   AST 18 08/26/2016   NA 137 08/26/2016   K 3.7 08/26/2016   CL 102 08/26/2016   CREATININE 1.15 08/26/2016   BUN 19 08/26/2016   CO2 29  08/26/2016   TSH 2.22 08/26/2016   PSA 0.83 08/13/2016   HGBA1C 5.6 09/21/2015    Lab Results  Component Value Date   TSH 2.22 08/26/2016   Lab Results  Component Value Date   WBC 6.3 08/26/2016   HGB 14.8 08/26/2016   HCT 43.8 08/26/2016   MCV 97.0 08/26/2016   PLT 258.0 08/26/2016   Lab Results  Component Value Date   NA 137 08/26/2016   K 3.7 08/26/2016  CO2 29 08/26/2016   GLUCOSE 95 08/26/2016   BUN 19 08/26/2016   CREATININE 1.15 08/26/2016   BILITOT 0.8 08/26/2016   ALKPHOS 56 08/26/2016   AST 18 08/26/2016   ALT 28 08/26/2016   PROT 7.2 08/26/2016   ALBUMIN 4.4 08/26/2016   CALCIUM 9.4 08/26/2016   ANIONGAP 11 02/03/2016   GFR 68.22 08/26/2016   Lab Results  Component Value Date   CHOL 246 (H) 09/21/2015   Lab Results  Component Value Date   HDL 53 09/21/2015   Lab Results  Component Value Date   LDLCALC 157 (H) 09/21/2015   Lab Results  Component Value Date   TRIG 181 (H) 09/21/2015   Lab Results  Component Value Date   CHOLHDL 4.6 09/21/2015   Lab Results  Component Value Date   HGBA1C 5.6 09/21/2015       Assessment & Plan:   Problem List Items Addressed This Visit    HTN (hypertension)    Well controlled, no changes to meds. Encouraged heart healthy diet such as the DASH diet and exercise as tolerated.       Myalgia     Patient concerned about several tick bites. Will ordered Lyme, Ehrlichia, RMSF testing. Encouraged to hydrate better and stay as active as tolerated. Spent 40 minutes with patient discussing symptoms and treatments.       Relevant Orders   CBC with Differential/Platelet   Comprehensive metabolic panel   Ehrlichia Antibody Panel   Rocky mtn spotted fvr ab, IgG-blood   B. Burgdorfi Antibodies   Vertigo    Symptoms have been notable for a couple of days. has been worst at night when he moves quickly he has a sense of everything spinning.Does not drink a significant amount of fluids each day, encouraged increased  hydration, at least 64 oz of clear fluids daily and given a course of Meclizine 25 mg tabs to use prn. Seek care if symptoms worsen      Relevant Orders   CBC with Differential/Platelet   Comprehensive metabolic panel   TSH   Sed Rate (ESR)   Arthralgia   Relevant Orders   Ehrlichia Antibody Panel   Rocky mtn spotted fvr ab, IgG-blood   B. Burgdorfi Antibodies   Fatigue - Primary   Relevant Orders   CBC with Differential/Platelet   Magnesium   TSH   Sed Rate (ESR)   Ehrlichia Antibody Panel   Rocky mtn spotted fvr ab, IgG-blood   B. Burgdorfi Antibodies    Other Visit Diagnoses    Urinary frequency          I have discontinued Jahden C. Cardella's lovastatin, ondansetron, and doxycycline. I am also having him start on meclizine. Additionally, I am having him maintain his losartan-hydrochlorothiazide and cholecalciferol.  Meds ordered this encounter  Medications  . meclizine (ANTIVERT) 25 MG tablet    Sig: Take 1 tablet (25 mg total) by mouth 3 (three) times daily as needed for dizziness.    Dispense:  30 tablet    Refill:  0     Penni Homans, MD

## 2017-07-24 NOTE — Assessment & Plan Note (Signed)
Well controlled, no changes to meds. Encouraged heart healthy diet such as the DASH diet and exercise as tolerated.  °

## 2017-07-24 NOTE — Assessment & Plan Note (Addendum)
Patient concerned about several tick bites. Will ordered Lyme, Ehrlichia, RMSF testing. Encouraged to hydrate better and stay as active as tolerated. Spent 40 minutes with patient discussing symptoms and treatments.

## 2017-07-24 NOTE — Assessment & Plan Note (Addendum)
Symptoms have been notable for a couple of days. has been worst at night when he moves quickly he has a sense of everything spinning.Does not drink a significant amount of fluids each day, encouraged increased hydration, at least 64 oz of clear fluids daily and given a course of Meclizine 25 mg tabs to use prn. Seek care if symptoms worsen

## 2017-09-30 ENCOUNTER — Other Ambulatory Visit: Payer: Self-pay | Admitting: Family Medicine

## 2017-10-04 NOTE — Telephone Encounter (Signed)
Patient is calling for a update on his medication. Please advise

## 2017-10-08 ENCOUNTER — Telehealth: Payer: Self-pay

## 2017-10-08 MED ORDER — HYDROCHLOROTHIAZIDE 12.5 MG PO CAPS: 12.5000 mg | ORAL_CAPSULE | Freq: Every day | ORAL | 3 refills | Status: DC

## 2017-10-08 MED ORDER — LOSARTAN POTASSIUM 100 MG PO TABS: 100.0000 mg | ORAL_TABLET | Freq: Every day | ORAL | 3 refills | Status: DC

## 2017-10-08 NOTE — Telephone Encounter (Signed)
Copied from CRM [phone]. Topic: General - Other >> Oct 08, 2017 11:32 AM Marylen PontoMcneil, Ja-Kwan wrote: Reason for CRM: Pt states the Rx for losartan-hydrochlorothiazide (HYZAAR) 100-12.5 MG tablet is on back order therefore he needs a Rx for an alternative medication. Pt requests a call back to discuss alternative medication. Cb# [phone]

## 2017-10-08 NOTE — Telephone Encounter (Signed)
Please advise 

## 2017-10-08 NOTE — Telephone Encounter (Signed)
Split rx into losartan and HCTZ for now- called pt and alerted him

## 2017-10-26 NOTE — Progress Notes (Addendum)
[facility] [address] [place], [place] [address] [phone]  Date:  10/27/2017   Name:  Joel Black   DOB:  Feb 27, 1955   MRN:  [id number]  PCP:  Pearline Cables, MD    Chief Complaint: Knee Pain (right knee, 3 months ago had sciatica radiating to knee, pain around knee cap and radiating to back of leg, mild pain but constant, worsening, worse whn driving)   History of Present Illness:  Joel Black is a 62 y.o. very pleasant male patient who presents with the following:  Here today with a knee problem history of HTN Due for labs today- he is fasting today Flu: will do today  Colon cancer: no family history, will set up for cologuard   BP Readings from Last 3 Encounters:  10/27/17 140/90  07/24/17 128/84  01/07/17 132/85   They did have a good summer  About 3 months ago he noted some issues with sciatica on the right. Typical pain from the right buttock into the leg.  It seemed to run into his right knee.  His back is now feeling better but his knee is still bothering him.  He will have a pain from his knee into his foot that feels like a nerve pain to him Driving is the biggest issue - he will have sx after about 15 minutes  His back pain is pretty much better otherwise  He is otherwise getting around well except for getting up and down. 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 Myalgia 07/24/2017  . Vertigo 07/24/2017  . Arthralgia 07/24/2017  . Fatigue 07/24/2017  . Atypical chest pain 09/21/2015  . BRBPR (bright red blood per rectum)   . Stenosis, cervical spine 02/13/2015  . Shortness  of breath 03/19/2014  . Solitary pulmonary nodule on lung CT 02/18/2014  . HTN (hypertension) 10/21/2012    Past Medical History:  Diagnosis Date  . Herniated cervical disc   . History of kidney stones   . Hypertension   . MRSA (methicillin resistant Staphylococcus aureus)   . Ulcer     History reviewed. No pertinent surgical history.  Social History   Tobacco Use  . Smoking status: Former Smoker    Packs/day: 2.00    Years: 15.00    Pack years: 30.00    Types: Cigarettes    Last attempt to quit: 06/11/1986    Years since quitting: 31.4  . Smokeless tobacco: Never Used  Substance Use Topics  . Alcohol use: Yes    Alcohol/week: 21.0 standard drinks    Types: 21 Glasses of wine per week    Comment: 3-4 glasses with dinner  . Drug use: No    Family History  Problem Relation Age of Onset  . Cancer Father        lung  . Diabetes Sister     No Known Allergies  Medication list has been reviewed and updated.  Current Outpatient Medications on File Prior to Visit  Medication Sig Dispense Refill  . hydrochlorothiazide (MICROZIDE) 12.5 MG capsule Take 1 capsule (12.5 mg total)  by mouth daily. 90 capsule 3  . losartan (COZAAR) 100 MG tablet Take 1 tablet (100 mg total) by mouth daily. 90 tablet 3   No current facility-administered medications on file prior to visit.     Review of Systems:  As per HPI- otherwise negative.   Physical Examination: Vitals:   10/27/17 0942  BP: 140/90  Pulse: 81  Resp: 16  SpO2: 99%   Vitals:   10/27/17 0942  Weight: 185 lb (83.9 kg)  Height: 5\' 11"  (1.803 m)   Body mass index is 25.8 kg/m. Ideal Body Weight: Weight in (lb) to have BMI = 25: 178.9  GEN: WDWN, NAD, Non-toxic, A & O x 3, looks well, normal weight  HEENT: Atraumatic, Normocephalic. Neck supple. No masses, No LAD. Ears and Nose: No external deformity. CV: RRR, No M/G/R. No JVD. No thrill. No extra heart sounds. PULM: CTA B, no wheezes, crackles, rhonchi. No  retractions. No resp. distress. No accessory muscle use. EXTR: No c/c/e NEURO Normal gait.  PSYCH: Normally interactive. Conversant. Not depressed or anxious appearing.  Calm demeanor.  No tenderness over his spine, paraspinous muscles or sciatic notches today  Normal BLE strength and sensation. He does have positive SLR on the right The right knee is a bit stiff to ROM, no definite effusion and no redness, no heat   Assessment and Plan: Hypertension, unspecified type - Plan: CBC, Comprehensive metabolic panel  Screening for prostate cancer - Plan: PSA  Screening for hyperlipidemia - Plan: Lipid panel  Screening for diabetes mellitus - Plan: Comprehensive metabolic panel, Hemoglobin A1c  Acute pain of right knee - Plan: DG Knee Complete 4 Views Right  Sciatica, right side - Plan: DG Lumbar Spine Complete, predniSONE (DELTASONE) 20 MG tablet  Screening for colon cancer  Immunization due  BP ok on current regimen Knee pain- this may be due to sciatica vs the joint itself Films of lower back and knee today  Prednisone- delay for a couple of days as flu shot today Routine labs pending Ordered cologuard   Signed Abbe Amsterdam, MD   Received his x-rays  Dg Lumbar Spine Complete  Result Date: 10/27/2017 CLINICAL DATA:  Right-sided sciatica. EXAM: LUMBAR SPINE - COMPLETE 4+ VIEW COMPARISON:  Lumbar MRI 10/25/2007.  Lumbar radiographs 08/01/2007 FINDINGS: Normal alignment.  No fracture or mass.  Mild levoscoliosis. Moderate disc degeneration at L3-4 asymmetric on the right, has progressed in the interval. Disc space narrowing and right lateral spurring at L3-4. Mild disc degeneration L1-2 and L2-3. Negative for pars defect. IMPRESSION: Progression of asymmetric disc degeneration on the right at L3-4 with right lateral spurring. No acute abnormality. Electronically Signed   By: Marlan Palau M.D.   On: 10/27/2017 15:52   Dg Knee Complete 4 Views Right  Result Date:  10/27/2017 CLINICAL DATA:  Right knee and low back pain. EXAM: RIGHT KNEE - COMPLETE 4+ VIEW COMPARISON:  None. FINDINGS: No acute fracture, dislocation, or knee joint effusion is identified. Mild medial compartment joint space narrowing and marginal osteophyte formation are noted. The lateral and patellofemoral compartments are unremarkable. The soft tissues are unremarkable. IMPRESSION: Mild medial compartment osteoarthrosis. Electronically Signed   By: Sebastian Ache M.D.   On: 10/27/2017 15:52   Pt needs letter so await his BW for letter or call to pt

## 2017-10-27 ENCOUNTER — Ambulatory Visit (INDEPENDENT_AMBULATORY_CARE_PROVIDER_SITE_OTHER): Payer: Commercial Managed Care - PPO | Admitting: Family Medicine

## 2017-10-27 ENCOUNTER — Ambulatory Visit (HOSPITAL_BASED_OUTPATIENT_CLINIC_OR_DEPARTMENT_OTHER)
Admission: RE | Admit: 2017-10-27 | Discharge: 2017-10-27 | Disposition: A | Discharge status: Home / Self Care | Payer: Commercial Managed Care - PPO | Source: Ambulatory Visit | Attending: Family Medicine | Admitting: Family Medicine

## 2017-10-27 ENCOUNTER — Encounter: Payer: Self-pay | Admitting: Family Medicine

## 2017-10-27 VITALS — BP 140/90 | HR 81 | Resp 16 | Ht 71.0 in | Wt 185.0 lb

## 2017-10-27 DIAGNOSIS — Z1211 Encounter for screening for malignant neoplasm of colon: Secondary | ICD-10-CM

## 2017-10-27 DIAGNOSIS — M1711 Unilateral primary osteoarthritis, right knee: Secondary | ICD-10-CM | POA: Diagnosis not present

## 2017-10-27 DIAGNOSIS — M5431 Sciatica, right side: Secondary | ICD-10-CM

## 2017-10-27 DIAGNOSIS — I10 Essential (primary) hypertension: Secondary | ICD-10-CM | POA: Diagnosis not present

## 2017-10-27 DIAGNOSIS — Z125 Encounter for screening for malignant neoplasm of prostate: Secondary | ICD-10-CM

## 2017-10-27 DIAGNOSIS — M25561 Pain in right knee: Secondary | ICD-10-CM | POA: Insufficient documentation

## 2017-10-27 DIAGNOSIS — Z13220 Encounter for screening for lipoid disorders: Secondary | ICD-10-CM

## 2017-10-27 DIAGNOSIS — M5136 Other intervertebral disc degeneration, lumbar region: Secondary | ICD-10-CM | POA: Diagnosis not present

## 2017-10-27 DIAGNOSIS — Z131 Encounter for screening for diabetes mellitus: Secondary | ICD-10-CM

## 2017-10-27 DIAGNOSIS — Z23 Encounter for immunization: Secondary | ICD-10-CM

## 2017-10-27 MED ORDER — PREDNISONE 20 MG PO TABS: ORAL_TABLET | ORAL | 0 refills | Status: DC

## 2017-10-27 NOTE — Patient Instructions (Signed)
Good to see you today!  Please go to lab and then x-ray Flu shot given today Consider shingles vaccine at your convenience, can be given as a nurse visit only   We will use prednisone for your sciatica pain today and will see how your knee looks on x-ray I will be in touch with your reports asap

## 2017-11-08 ENCOUNTER — Other Ambulatory Visit: Payer: Self-pay | Admitting: Family Medicine

## 2017-11-08 ENCOUNTER — Ambulatory Visit: Payer: Self-pay

## 2017-11-08 NOTE — Telephone Encounter (Signed)
Seen here on 10/9 with likely sciatica  BP ok on current regimen Knee pain- this may be due to sciatica vs the joint itself Films of lower back and knee today  Prednisone- delay for a couple of days as flu shot today Routine labs pending Ordered cologuard   We did films as follows  Dg Lumbar Spine Complete  Result Date: 10/27/2017 CLINICAL DATA:  Right-sided sciatica. EXAM: LUMBAR SPINE - COMPLETE 4+ VIEW COMPARISON:  Lumbar MRI 10/25/2007.  Lumbar radiographs 08/01/2007 FINDINGS: Normal alignment.  No fracture or mass.  Mild levoscoliosis. Moderate disc degeneration at L3-4 asymmetric on the right, has progressed in the interval. Disc space narrowing and right lateral spurring at L3-4. Mild disc degeneration L1-2 and L2-3. Negative for pars defect. IMPRESSION: Progression of asymmetric disc degeneration on the right at L3-4 with right lateral spurring. No acute abnormality. Electronically Signed   By: Marlan Palau M.D.   On: 10/27/2017 15:52   Dg Knee Complete 4 Views Right  Result Date: 10/27/2017 CLINICAL DATA:  Right knee and low back pain. EXAM: RIGHT KNEE - COMPLETE 4+ VIEW COMPARISON:  None. FINDINGS: No acute fracture, dislocation, or knee joint effusion is identified. Mild medial compartment joint space narrowing and marginal osteophyte formation are noted. The lateral and patellofemoral compartments are unremarkable. The soft tissues are unremarkable. IMPRESSION: Mild medial compartment osteoarthrosis. Electronically Signed   By: Sebastian Ache M.D.   On: 10/27/2017 15:52   Called him back to get details  Last night he had cramping in both legs, lasted into this am.  Some milder cramping today He is getting some heartburn as well He wonders if he could stop his prednisone a day or so early -this is fine with me.  He will try gatorade and let me know if cramping persists He never got his film results as I was waiting on his labs to come in, but he forgot to get his blood drawn!   Discussed his films as above His original back/ leg sx are much better  He does have significant degenerative change in his back-if sx come back consider an MRI  Will mail him a copy of his films. Apologized for not getting his these results sooner

## 2017-11-08 NOTE — Telephone Encounter (Signed)
Pt c/o muscle cramping to bilateral lower extremities and feet 1 hour after falling asleep and and throughout the night.  Pt stated he is currently taking Prednisone for the past 8 days. Pt is asking if he needs to stop taking the Prednsone. Advised  not to stop medication without a doctors advice.. Pt c/o headache and stiff neck. Pt is able to put his chin to his chest.   Reason for Disposition . Caller has URGENT medication question about med that PCP prescribed and triager unable to answer question  Answer Assessment - Initial Assessment Questions 1. SYMPTOMS: "Do you have any symptoms?"  Muscle cramping to feet, back of leg, behind knee, and side muscles to both legs. Several episodes duing the night and this morning. Currently on Prednisone 8 days so far and pt wanting to know if he needs to keep taking the Prednisone. Stiff neck and headache. Can put chin on chest.  2. SEVERITY: If symptoms are present, ask "Are they mild, moderate or severe?"     Severe.  Protocols used: MEDICATION QUESTION CALL-A-AH

## 2017-12-15 ENCOUNTER — Telehealth: Payer: Self-pay | Admitting: Family Medicine

## 2017-12-15 DIAGNOSIS — M25561 Pain in right knee: Secondary | ICD-10-CM

## 2017-12-15 NOTE — Telephone Encounter (Signed)
Called him back- he notes that the prednisone got rid of his sx for a while but they they came back.  He has pain down into the right lower leg and foot again Discussed an MRI of his back. However thinking back he did hit his knee around the time this all started and wonders if it might be his knee joint after all.  Also, all of his joints seem to be achy and stiff No fever or constitutional sx however He would like to see ortho for a consultation which is a reasonable idea.  Ordered consultation for him- will try to get him seen next week if we can He will contact me if anything else needed in the interim

## 2017-12-15 NOTE — Telephone Encounter (Signed)
Copied from CRM [phone]. Topic: Quick Communication - See Telephone Encounter >> Dec 15, 2017  2:16 PM Herby AbrahamJohnson, Shiquita C wrote: CRM for notification. See Telephone encounter for: 12/15/17.  Pt called in to be advised by PCP. Pt says that he has been prescribed a steroid to help with his nerve in his knee and now is having pain in his foot. Pt says that he would like to know per PCP what should he do next? Should he see a specialist? Pt would like to be advised further.  CB: [phone]

## 2018-03-08 ENCOUNTER — Ambulatory Visit: Payer: Self-pay | Admitting: *Deleted

## 2018-03-08 NOTE — Telephone Encounter (Signed)
Pt reports cough x 2 weeks. States had been coughing up clear, thick phlegm. Reports this am had coughed up "Like a fleck" of blood with sputum. Prior to call at 1200 coughed up "Ball" of blood with sputum 1/4 in diameter; "not bright red but not dark."  Denies dizziness, fever, CP. Does report mild SOB with congestion, "But also a bit anxious this AM." Pt is not on anticoagulant. Attempted to secure appt for today, pt states not able to make appt today.  Appt made with Dr. Drue Novel for tomorrow next availability.  Care advise given; instructed to CB if symptoms worsen, dizziness, CP, increased SOB, increased hemoptysis occurs. Pt verbalizes understanding.  Reason for Disposition . Coughing up rusty-colored sputum  Answer Assessment - Initial Assessment Questions 1. ONSET: "When did you start coughing up blood?"     This am 2. SEVERITY: "How many times?" "How much blood?" (e.g., flecks, streaks, tablespoons, etc)     Flecks at first, now 1"ball" 1/4 in in diameter, bright red 3. COUGHING SPASMS: "Did the blood appear after a coughing spell?"       no 4. RESPIRATORY DISTRESS: "Describe your breathing."     Mild SOB with congestion, "Little anxious though" 5. FEVER: "Do you have a fever?" If so, ask: "What is your temperature, how was it measured, and when did it start?"     no 6. SPUTUM: "Describe the color of your sputum" (clear, white, yellow, green), "Has there been any change recently?"    clear 7. CARDIAC HISTORY: "Do you have any history of heart disease?" (e.g., heart attack, congestive heart failure)      HTN 8. LUNG HISTORY: "Do you have any history of lung disease?"  (e.g., pulmonary embolus, asthma, emphysema)     NO, family history 9. PE RISK FACTORS: "Do you have a history of blood clots?" (or: recent major surgery, recent prolonged travel, bedridden)     no 10. OTHER SYMPTOMS: "Do you have any other symptoms?" (e.g., nosebleed, chest pain, abdominal pain, vomiting)       no  12.  TRAVEL: "Have you traveled out of the country in the last month?" (e.g., travel history, exposures)      no  Protocols used: COUGHING UP BLOOD-A-AH

## 2018-03-09 ENCOUNTER — Ambulatory Visit (INDEPENDENT_AMBULATORY_CARE_PROVIDER_SITE_OTHER): Payer: Commercial Managed Care - PPO | Admitting: Internal Medicine

## 2018-03-09 ENCOUNTER — Ambulatory Visit (HOSPITAL_BASED_OUTPATIENT_CLINIC_OR_DEPARTMENT_OTHER)
Admission: RE | Admit: 2018-03-09 | Discharge: 2018-03-09 | Disposition: A | Discharge status: Home / Self Care | Payer: Commercial Managed Care - PPO | Source: Ambulatory Visit | Attending: Internal Medicine | Admitting: Internal Medicine

## 2018-03-09 ENCOUNTER — Encounter: Payer: Self-pay | Admitting: Internal Medicine

## 2018-03-09 VITALS — BP 132/84 | HR 91 | Temp 98.2°F | Resp 16 | Ht 71.0 in | Wt 192.4 lb

## 2018-03-09 DIAGNOSIS — R059 Cough, unspecified: Secondary | ICD-10-CM

## 2018-03-09 DIAGNOSIS — R042 Hemoptysis: Secondary | ICD-10-CM | POA: Insufficient documentation

## 2018-03-09 DIAGNOSIS — R918 Other nonspecific abnormal finding of lung field: Secondary | ICD-10-CM | POA: Diagnosis not present

## 2018-03-09 DIAGNOSIS — R05 Cough: Secondary | ICD-10-CM

## 2018-03-09 MED ORDER — AZITHROMYCIN 250 MG PO TABS: ORAL_TABLET | ORAL | 0 refills | Status: DC

## 2018-03-09 NOTE — Progress Notes (Signed)
Subjective:    Patient ID: Joel Black, male    DOB: 12-28-1955, 63 y.o.   MRN: [id number]  DOS:  03/09/2018 Type of visit - description: Acute visit Symptoms started several months ago: He has a lot of chest congestion which triggers cough, he brings up thick clear sputum. This happens essentially every day and the sputum production is more in the morning. He is here because yesterday he saw traces of blood on the sputum including 1/4 inch red blood piece He is a former smoker, + FH lung cancer.   Review of Systems  No fever chills No weight loss Occasionally has mild allergies characterized by sneezing but denies sinus pain or congestion Very rarely has GERD No wheezing No previous diagnosis of COPD or emphysema.  Past Medical History:  Diagnosis Date  . Herniated cervical disc   . History of kidney stones   . Hypertension   . MRSA (methicillin resistant Staphylococcus aureus)   . Ulcer     No past surgical history on file.  Social History   Socioeconomic History  . Marital status: Married    Spouse name: Not on file  . Number of children: Not on file  . Years of education: Not on file  . Highest education level: Not on file  Occupational History  . Occupation: Merchandiser, retail   Social Needs  . Financial resource strain: Not on file  . Food insecurity:    Worry: Not on file    Inability: Not on file  . Transportation needs:    Medical: Not on file    Non-medical: Not on file  Tobacco Use  . Smoking status: Former Smoker    Packs/day: 2.00    Years: 15.00    Pack years: 30.00    Types: Cigarettes    Last attempt to quit: 06/11/1986    Years since quitting: 31.7  . Smokeless tobacco: Never Used  Substance and Sexual Activity  . Alcohol use: Yes    Alcohol/week: 21.0 standard drinks    Types: 21 Glasses of wine per week    Comment: 3-4 glasses with dinner  . Drug use: No  . Sexual activity: Yes    Partners: Female  Lifestyle  . Physical  activity:    Days per week: Not on file    Minutes per session: Not on file  . Stress: Not on file  Relationships  . Social connections:    Talks on phone: Not on file    Gets together: Not on file    Attends religious service: Not on file    Active member of club or organization: Not on file    Attends meetings of clubs or organizations: Not on file    Relationship status: Not on file  . Intimate partner violence:    Fear of current or ex partner: Not on file    Emotionally abused: Not on file    Physically abused: Not on file    Forced sexual activity: Not on file  Other Topics Concern  . Not on file  Social History Narrative  . Not on file      Allergies as of 03/09/2018   No Known Allergies     Medication List       Accurate as of March 09, 2018 11:42 AM. Always use your most recent med list.        hydrochlorothiazide 12.5 MG capsule Commonly known as:  MICROZIDE Take 1 capsule (12.5 mg total) by mouth daily.  losartan 100 MG tablet Commonly known as:  COZAAR Take 1 tablet (100 mg total) by mouth daily.   predniSONE 20 MG tablet Commonly known as:  DELTASONE Take 2 pills a daily for 5 days, then 1 pill a day for 5 days           Objective:   Physical Exam BP 132/84 (BP Location: Left Arm, Patient Position: Sitting, Cuff Size: Normal)   Pulse 91   Temp 98.2 F (36.8 C) (Oral)   Resp 16   Ht 5\' 11"  (1.803 m)   Wt 192 lb 6 oz (87.3 kg)   SpO2 98%   BMI 26.83 kg/m  General:   Well developed, NAD, BMI noted. HEENT:  Normocephalic . Face symmetric, atraumatic.  TMs normal.  Throat symmetric, no red, tonsils absent.  Nose not congested. Neck: Symmetric, no enlarged lymph nodes on the neck or supraclavicular areas Lungs:  Decreased breath sounds otherwise clear Normal respiratory effort, no intercostal retractions, no accessory muscle use. Heart: RRR,  no murmur.  No pretibial edema bilaterally  Skin: Not pale. Not jaundice Neurologic:    alert & oriented X3.  Speech normal, gait appropriate for age and unassisted Psych--  Cognition and judgment appear intact.  Cooperative with normal attention span and concentration.  Behavior appropriate. No anxious or depressed appearing.      Assessment    63 year old gentleman, PMH includes  HTN, h/o lung nodule presents with  Subacute cough, hemoptysis x1  The patient is 63, he smoked for 26 years, stopped in 1988, smoked a pack a day. His father was diagnosed with lung cancer at age 26. Has a history of lung nodules, last CT 2017, nodules stable thus considered benign. No previous diagnosis of COPD of asthma Pt has persisting cough x few months and hemoptyses x 1 . Physical exam is benign except for decreased breath sounds. I think he needs further work-up, will get a chest x-ray. I wonder about atypical infection, prescribed Zithromax and recommend Mucinex DM. We will also order a CAT scan chest. Follow-up with PCP in 2 to 3 weeks, for further eval.  PFTs?Marland Kitchen

## 2018-03-09 NOTE — Patient Instructions (Signed)
  GO TO THE FRONT DESK Schedule your next appointment  For a physical with Dr Patsy Lager in the next 2-3 weeks    STOP BY THE FIRST FLOOR:  get the XR   Please take Mucinex DM as needed for cough  Take antibiotic called Zithromax  We will schedule a CAT scan of your chest.  If you have more blood when you cough, please call the office.

## 2018-03-09 NOTE — Progress Notes (Signed)
Pre visit review using our clinic review tool, if applicable. No additional management support is needed unless otherwise documented below in the visit note. 

## 2018-03-18 ENCOUNTER — Ambulatory Visit (HOSPITAL_BASED_OUTPATIENT_CLINIC_OR_DEPARTMENT_OTHER): Payer: Commercial Managed Care - PPO

## 2018-03-29 NOTE — Progress Notes (Addendum)
[facility] [address] [place], [place] [address] [phone]  Date:  03/30/2018   Name:  Joel Black   DOB:  [birth year]   MRN:  [id number]  PCP:  Darreld Mclean, MD    Chief Complaint: Annual Exam (declines shingrix vaccine)   History of Present Illness:  Joel Black is a 63 y.o. very pleasant male patient who presents with the following:  Joel Black is here for complete physical.  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  His daughter is 36 years old He saw Dr. Larose Kells last month for cough, I most recently saw him in October with sciatica Recent illness has resolved   Colon cancer screening: I ordered Cologuard in the fall, he will do this asap, keeps forgetting Immunizations: Declines flu, suggest Shingrix- he plans to do at a later date  Labs: Due for complete labs today- he is fasting  Did not take his BP meds yet today -this is likely why blood pressure is slightly elevated  He notes some issues with joint pains-many of his joints can be stiff or painful. He has been to see ortho for his knee He notes more issues with his joints in general over the last several weeks.  His job is fairly physical, and can take a toll on him He got a cortisone shot in his knee per orthopedics, it has helped some.  He has been using some glucosamine  He will take OTC meds as needed  We have followed up on a pulmonary nodule for him, most recent CT in 2017 shows stability IMPRESSION: Well-defined oval 9 mm nodule along the minor fissure unchanged from July 2015. Few other smaller bilateral nodules all unchanged from 2015. These have been stable for greater than 2 years and can be considered benign. No new nodules. No acute findings. Subtle  atherosclerotic coronary artery disease. Subtle aortic atherosclerosis. 2 mm nonobstructing left renal stone.  However Joel Black did mention hemoptysis when he was seen by Dr. Larose Kells in February.  Dr. Larose Kells had wanted to get a CT scan, but this was not covered under his insurance.  Joel Black has thought more about this, and would like to try and proceed with the CT even if he has to pay some out-of-pocket He also notes that he may get more winded with exercise than in the past.  He does not have any chest pain or pressure, he feels like this is a lung issue. He would be interested in discussing this with a pulmonologist  He did do a stress test about 2-1/2 years ago-nuclear stress was low risk He quit smoking about 30 years ago  He likes to walk for exercise, will take his dog to the park and walk for about 90 minutes.  He does not on a regular basis  Patient Active Problem List   Diagnosis Date Noted  . Myalgia 07/24/2017  . Vertigo 07/24/2017  . Arthralgia 07/24/2017  . Fatigue 07/24/2017  . Atypical chest pain 09/21/2015  . BRBPR (bright red blood per rectum)   . Stenosis, cervical spine 02/13/2015  . Shortness of breath 03/19/2014  . Solitary pulmonary nodule on lung CT 02/18/2014  . HTN (hypertension) 10/21/2012    Past Medical History:  Diagnosis Date  . Herniated cervical  disc   . History of kidney stones   . Hypertension   . MRSA (methicillin resistant Staphylococcus aureus)   . Ulcer     History reviewed. No pertinent surgical history.  Social History   Tobacco Use  . Smoking status: Former Smoker    Packs/day: 2.00    Years: 15.00    Pack years: 30.00    Types: Cigarettes    Last attempt to quit: 06/11/1986    Years since quitting: 31.8  . Smokeless tobacco: Never Used  Substance Use Topics  . Alcohol use: Yes    Alcohol/week: 21.0 standard drinks    Types: 21 Glasses of wine per week    Comment: 3-4 glasses with dinner  . Drug use: No    Family History  Problem  Relation Age of Onset  . Cancer Father        lung  . Diabetes Sister     No Known Allergies  Medication list has been reviewed and updated.  Current Outpatient Medications on File Prior to Visit  Medication Sig Dispense Refill  . hydrochlorothiazide (MICROZIDE) 12.5 MG capsule Take 1 capsule (12.5 mg total) by mouth daily. 90 capsule 3  . losartan (COZAAR) 100 MG tablet Take 1 tablet (100 mg total) by mouth daily. 90 tablet 3   No current facility-administered medications on file prior to visit.     Review of Systems:  As per HPI- otherwise negative. No fever or chills, no chest pain or shortness of breath No urinary retention or other genital concerns.  No skin changes  Physical Examination: Vitals:   03/30/18 1050  BP: (!) 140/94  Pulse: 79  Resp: 16  Temp: (!) 97.5 F (36.4 C)  SpO2: 98%   Vitals:   03/30/18 1050  Weight: 196 lb (88.9 kg)  Height: _0  (1.803 m)   Body mass index is 27.34 kg/m. Ideal Body Weight: Weight in (lb) to have BMI = 25: 178.9  GEN: WDWN, NAD, Non-toxic, A & O x 3, minimal overweight, looks well HEENT: Atraumatic, Normocephalic. Neck supple. No masses, No LAD.  Bilateral TM wnl, oropharynx normal.  PEERL,EOMI.   Ears and Nose: No external deformity. CV: RRR, No M/G/R. No JVD. No thrill. No extra heart sounds. PULM: CTA B, no wheezes, crackles, rhonchi. No retractions. No resp. distress. No accessory muscle use. ABD: S, NT, ND, +BS. No rebound. No HSM. EXTR: No c/c/e NEURO Normal gait.  PSYCH: Normally interactive. Conversant. Not depressed or anxious appearing.  Calm demeanor.    EKG: NSR  Compared with tracing from 2017- change in III, likely due to lead placement  Assessment and Plan: Physical exam  Screening for prostate cancer - Plan: PSA  Screening for hyperlipidemia - Plan: Lipid panel, LDL cholesterol, direct  Screening for diabetes mellitus - Plan: Comprehensive metabolic panel, Hemoglobin A1c  Hypertension,  unspecified type - Plan: CBC, Comprehensive metabolic panel  Screening for colon cancer  Immunization due  Screening for HIV (human immunodeficiency virus) - Plan: HIV Antibody (routine testing w rflx)  SOB (shortness of breath) - Plan: EKG 12-Lead, Ambulatory referral to Pulmonology  Arthralgia, unspecified joint - Plan: Sedimentation rate, Rheumatoid Factor  Hemoptysis - Plan: CT Chest W Contrast  Here today for complete physical, routine labs pending as above Encouraged Shingrix He has a Cologuard kit at home and will complete it He has noted more joint pains, will do some basic labs to evaluate for rheumatoid arthritis Recommend Tylenol as his mainstay arthritis medication, due  to less risk of side effects He notes some shortness of breath with exertion.  Recent stress test, so cardiac causes less likely He also had an episode of hemoptysis with recent illness EKG normal today Referral to pulmonology, ordered CT scan of his chest-asked referrals to look at alternative imaging sites such as [facility] or tried imaging, these may be less expensive  Signed Lamar Blinks, MD  Received his labs as below-await a couple more items, will then send lab letter Note PSA is trended up  Results for orders placed or performed in visit on 03/30/18  CBC  Result Value Ref Range   WBC 5.8 4.0 - 10.5 K/uL   RBC 4.59 4.22 - 5.81 Mil/uL   Platelets 253.0 150.0 - 400.0 K/uL   Hemoglobin 15.5 13.0 - 17.0 g/dL   HCT 43.8 39.0 - 52.0 %   MCV 95.3 78.0 - 100.0 fl   MCHC 35.4 30.0 - 36.0 g/dL   RDW 13.4 11.5 - 15.5 %  Comprehensive metabolic panel  Result Value Ref Range   Sodium 138 135 - 145 mEq/L   Potassium 4.2 3.5 - 5.1 mEq/L   Chloride 103 96 - 112 mEq/L   CO2 29 19 - 32 mEq/L   Glucose, Bld 89 70 - 99 mg/dL   BUN 22 6 - 23 mg/dL   Creatinine, Ser 1.05 0.40 - 1.50 mg/dL   Total Bilirubin 0.8 0.2 - 1.2 mg/dL   Alkaline Phosphatase 60 39 - 117 U/L   AST 22 0 - 37 U/L   ALT 26  0 - 53 U/L   Total Protein 6.7 6.0 - 8.3 g/dL   Albumin 4.4 3.5 - 5.2 g/dL   Calcium 9.3 8.4 - 10.5 mg/dL   GFR 70.94 >60.00 mL/min  Hemoglobin A1c  Result Value Ref Range   Hgb A1c MFr Bld 5.5 4.6 - 6.5 %  Lipid panel  Result Value Ref Range   Cholesterol 246 (H) 0 - 200 mg/dL   Triglycerides 224.0 (H) 0.0 - 149.0 mg/dL   HDL 60.10 >39.00 mg/dL   VLDL 44.8 (H) 0.0 - 40.0 mg/dL   Total CHOL/HDL Ratio 4    NonHDL 185.45   PSA  Result Value Ref Range   PSA 1.22 0.10 - 4.00 ng/mL  HIV Antibody (routine testing w rflx)  Result Value Ref Range   HIV 1&2 Ab, 4th Generation NON-REACTIVE NON-REACTI  Sedimentation rate  Result Value Ref Range   Sed Rate 7 0 - 20 mm/hr  Rheumatoid Factor  Result Value Ref Range   Rhuematoid fact SerPl-aCnc <14 <14 IU/mL  LDL cholesterol, direct  Result Value Ref Range   Direct LDL 151.0 mg/dL    Received the rest of his labs 3/13-  Letter to pt   Lab Results  Component Value Date   PSA 1.22 03/30/2018   PSA 0.83 08/13/2016   PSA 0.84 12/26/2015   Will order repeat PSA for 2-3 months   The 10-year ASCVD risk score Mikey Bussing DC Jr., et al., 2013) is: 16.6%   Values used to calculate the score:     Age: 34 years     Sex: Male     Is Non-Hispanic African American: No     Diabetic: No     Tobacco smoker: No     Systolic Blood Pressure: 950 mmHg     Is BP treated: Yes     HDL Cholesterol: 60.1 mg/dL     Total Cholesterol: 246 mg/dL

## 2018-03-30 ENCOUNTER — Ambulatory Visit (INDEPENDENT_AMBULATORY_CARE_PROVIDER_SITE_OTHER): Payer: Commercial Managed Care - PPO | Admitting: Family Medicine

## 2018-03-30 ENCOUNTER — Encounter: Payer: Self-pay | Admitting: Family Medicine

## 2018-03-30 ENCOUNTER — Other Ambulatory Visit: Payer: Self-pay

## 2018-03-30 VITALS — BP 140/94 | HR 79 | Temp 97.5°F | Resp 16 | Ht 71.0 in | Wt 196.0 lb

## 2018-03-30 DIAGNOSIS — R042 Hemoptysis: Secondary | ICD-10-CM

## 2018-03-30 DIAGNOSIS — I10 Essential (primary) hypertension: Secondary | ICD-10-CM | POA: Diagnosis not present

## 2018-03-30 DIAGNOSIS — Z0000 Encounter for general adult medical examination without abnormal findings: Secondary | ICD-10-CM | POA: Diagnosis not present

## 2018-03-30 DIAGNOSIS — Z131 Encounter for screening for diabetes mellitus: Secondary | ICD-10-CM | POA: Diagnosis not present

## 2018-03-30 DIAGNOSIS — Z1211 Encounter for screening for malignant neoplasm of colon: Secondary | ICD-10-CM

## 2018-03-30 DIAGNOSIS — Z13220 Encounter for screening for lipoid disorders: Secondary | ICD-10-CM | POA: Diagnosis not present

## 2018-03-30 DIAGNOSIS — R0602 Shortness of breath: Secondary | ICD-10-CM | POA: Diagnosis not present

## 2018-03-30 DIAGNOSIS — M2550 Pain in unspecified joint: Secondary | ICD-10-CM

## 2018-03-30 DIAGNOSIS — Z125 Encounter for screening for malignant neoplasm of prostate: Secondary | ICD-10-CM | POA: Diagnosis not present

## 2018-03-30 DIAGNOSIS — Z114 Encounter for screening for human immunodeficiency virus [HIV]: Secondary | ICD-10-CM

## 2018-03-30 DIAGNOSIS — Z23 Encounter for immunization: Secondary | ICD-10-CM

## 2018-03-30 DIAGNOSIS — R972 Elevated prostate specific antigen [PSA]: Secondary | ICD-10-CM

## 2018-03-30 LAB — CBC
HEMATOCRIT: 43.8 % (ref 39.0–52.0)
HEMOGLOBIN: 15.5 g/dL (ref 13.0–17.0)
MCHC: 35.4 g/dL (ref 30.0–36.0)
MCV: 95.3 fl (ref 78.0–100.0)
PLATELETS: 253 10*3/uL (ref 150.0–400.0)
RBC: 4.59 Mil/uL (ref 4.22–5.81)
RDW: 13.4 % (ref 11.5–15.5)
WBC: 5.8 10*3/uL (ref 4.0–10.5)

## 2018-03-30 LAB — LIPID PANEL
Cholesterol: 246 mg/dL — ABNORMAL HIGH (ref 0–200)
HDL: 60.1 mg/dL (ref >39.00)
NonHDL: 185.45
Total CHOL/HDL Ratio: 4
Triglycerides: 224 mg/dL — ABNORMAL HIGH (ref 0.0–149.0)
VLDL: 44.8 mg/dL — ABNORMAL HIGH (ref 0.0–40.0)

## 2018-03-30 LAB — COMPREHENSIVE METABOLIC PANEL
ALBUMIN: 4.4 g/dL (ref 3.5–5.2)
ALK PHOS: 60 U/L (ref 39–117)
ALT: 26 U/L (ref 0–53)
AST: 22 U/L (ref 0–37)
BILIRUBIN TOTAL: 0.8 mg/dL (ref 0.2–1.2)
BUN: 22 mg/dL (ref 6–23)
CALCIUM: 9.3 mg/dL (ref 8.4–10.5)
CO2: 29 mEq/L (ref 19–32)
Chloride: 103 mEq/L (ref 96–112)
Creatinine, Ser: 1.05 mg/dL (ref 0.40–1.50)
GFR: 70.94 mL/min (ref >60.00)
GLUCOSE: 89 mg/dL (ref 70–99)
POTASSIUM: 4.2 meq/L (ref 3.5–5.1)
Sodium: 138 mEq/L (ref 135–145)
TOTAL PROTEIN: 6.7 g/dL (ref 6.0–8.3)

## 2018-03-30 LAB — HEMOGLOBIN A1C: Hgb A1c MFr Bld: 5.5 % (ref 4.6–6.5)

## 2018-03-30 LAB — SEDIMENTATION RATE: Sed Rate: 7 mm/hr (ref 0–20)

## 2018-03-30 LAB — LDL CHOLESTEROL, DIRECT: Direct LDL: 151 mg/dL

## 2018-03-30 LAB — PSA: PSA: 1.22 ng/mL (ref 0.10–4.00)

## 2018-03-30 NOTE — Patient Instructions (Addendum)
It was good to see you today, I will be in touch with your labs ASAP Continue your current blood pressure regimen I will refer you to pulmonology to look at your breathing concerns We will also try again to set up a CT of your lungs You might get the shingles vaccine, at your convenience  For joint pains, I recommend Tylenol as her mainstay medication.  Over-the-counter anti-inflammatories like ibuprofen or Aleve can also be helpful, but may give you more side effect risk    Health Maintenance, Male A healthy lifestyle and preventive care is important for your health and wellness. Ask your health care provider about what schedule of regular examinations is right for you. What should I know about weight and diet? Eat a Healthy Diet  Eat plenty of vegetables, fruits, whole grains, low-fat dairy products, and lean protein.  Do not eat a lot of foods high in solid fats, added sugars, or salt.  Maintain a Healthy Weight Regular exercise can help you achieve or maintain a healthy weight. You should:  Do at least 150 minutes of exercise each week. The exercise should increase your heart rate and make you sweat (moderate-intensity exercise).  Do strength-training exercises at least twice a week. Watch Your Levels of Cholesterol and Blood Lipids  Have your blood tested for lipids and cholesterol every 5 years starting at 65 years of age. If you are at high risk for heart disease, you should start having your blood tested when you are 65 years old. You may need to have your cholesterol levels checked more often if: ? Your lipid or cholesterol levels are high. ? You are older than 65 years of age. ? You are at high risk for heart disease. What should I know about cancer screening? Many types of cancers can be detected early and may often be prevented. Lung Cancer  You should be screened every year for lung cancer if: ? You are a current smoker who has smoked for at least 30 years. ? You are  a former smoker who has quit within the past 15 years.  Talk to your health care provider about your screening options, when you should start screening, and how often you should be screened. Colorectal Cancer  Routine colorectal cancer screening usually begins at 65 years of age and should be repeated every 5-10 years until you are 65 years old. You may need to be screened more often if early forms of precancerous polyps or small growths are found. Your health care provider may recommend screening at an earlier age if you have risk factors for colon cancer.  Your health care provider may recommend using home test kits to check for hidden blood in the stool.  A small camera at the end of a tube can be used to examine your colon (sigmoidoscopy or colonoscopy). This checks for the earliest forms of colorectal cancer. Prostate and Testicular Cancer  Depending on your age and overall health, your health care provider may do certain tests to screen for prostate and testicular cancer.  Talk to your health care provider about any symptoms or concerns you have about testicular or prostate cancer. Skin Cancer  Check your skin from head to toe regularly.  Tell your health care provider about any new moles or changes in moles, especially if: ? There is a change in a mole's size, shape, or color. ? You have a mole that is larger than a pencil eraser.  Always use sunscreen. Apply sunscreen liberally  and repeat throughout the day.  Protect yourself by wearing long sleeves, pants, a wide-brimmed hat, and sunglasses when outside. What should I know about heart disease, diabetes, and high blood pressure?  If you are 74-15 years of age, have your blood pressure checked every 3-5 years. If you are 30 years of age or older, have your blood pressure checked every year. You should have your blood pressure measured twice-once when you are at a hospital or clinic, and once when you are not at a hospital or  clinic. Record the average of the two measurements. To check your blood pressure when you are not at a hospital or clinic, you can use: ? An automated blood pressure machine at a pharmacy. ? A home blood pressure monitor.  Talk to your health care provider about your target blood pressure.  If you are between 71-10 years old, ask your health care provider if you should take aspirin to prevent heart disease.  Have regular diabetes screenings by checking your fasting blood sugar level. ? If you are at a normal weight and have a low risk for diabetes, have this test once every three years after the age of 81. ? If you are overweight and have a high risk for diabetes, consider being tested at a younger age or more often.  A one-time screening for abdominal aortic aneurysm (AAA) by ultrasound is recommended for men aged 65-75 years who are current or former smokers. What should I know about preventing infection? Hepatitis B If you have a higher risk for hepatitis B, you should be screened for this virus. Talk with your health care provider to find out if you are at risk for hepatitis B infection. Hepatitis C Blood testing is recommended for:  Everyone born from [birth year] through 1967.  Anyone with known risk factors for hepatitis C. Sexually Transmitted Diseases (STDs)  You should be screened each year for STDs including gonorrhea and chlamydia if: ? You are sexually active and are younger than 65 years of age. ? You are older than 65 years of age and your health care provider tells you that you are at risk for this type of infection. ? Your sexual activity has changed since you were last screened and you are at an increased risk for chlamydia or gonorrhea. Ask your health care provider if you are at risk.  Talk with your health care provider about whether you are at high risk of being infected with HIV. Your health care provider may recommend a prescription medicine to help prevent HIV  infection. What else can I do?  Schedule regular health, dental, and eye exams.  Stay current with your vaccines (immunizations).  Do not use any tobacco products, such as cigarettes, chewing tobacco, and e-cigarettes. If you need help quitting, ask your health care provider.  Limit alcohol intake to no more than 2 drinks per day. One drink equals 12 ounces of beer, 5 ounces of wine, or 1 ounces of hard liquor.  Do not use street drugs.  Do not share needles.  Ask your health care provider for help if you need support or information about quitting drugs.  Tell your health care provider if you often feel depressed.  Tell your health care provider if you have ever been abused or do not feel safe at home. This information is not intended to replace advice given to you by your health care provider. Make sure you discuss any questions you have with your health care provider. Document  Released: 07/04/2007 Document Revised: 09/04/2015 Document Reviewed: 10/09/2014 Elsevier Interactive Patient Education  2019 [employer].

## 2018-03-31 LAB — RHEUMATOID FACTOR: Rheumatoid fact SerPl-aCnc: <14 IU/mL (ref <14)

## 2018-03-31 LAB — HIV ANTIBODY (ROUTINE TESTING W REFLEX): HIV 1&2 Ab, 4th Generation: NONREACTIVE

## 2018-04-01 NOTE — Addendum Note (Signed)
Addended by: Abbe Amsterdam C on: 04/01/2018 03:02 PM   Modules accepted: Orders

## 2018-06-29 ENCOUNTER — Ambulatory Visit (INDEPENDENT_AMBULATORY_CARE_PROVIDER_SITE_OTHER): Payer: Medicare HMO | Admitting: Family Medicine

## 2018-06-29 ENCOUNTER — Encounter: Payer: Self-pay | Admitting: Family Medicine

## 2018-06-29 ENCOUNTER — Other Ambulatory Visit: Payer: Self-pay

## 2018-06-29 DIAGNOSIS — W57XXXA Bitten or stung by nonvenomous insect and other nonvenomous arthropods, initial encounter: Secondary | ICD-10-CM | POA: Diagnosis not present

## 2018-06-29 DIAGNOSIS — S80869A Insect bite (nonvenomous), unspecified lower leg, initial encounter: Secondary | ICD-10-CM

## 2018-06-29 MED ORDER — DOXYCYCLINE HYCLATE 100 MG PO CAPS: ORAL_CAPSULE | ORAL | 0 refills | Status: DC

## 2018-06-29 NOTE — Progress Notes (Signed)
[facility] [address], [address] [place], [place] [address] [phone] [phone]  Date:  06/29/2018   Name:  Joel Black   DOB:  12/23/1955   MRN:  [id number]  PCP:  Pearline Cablesopland, Jatziry Wechter C, MD    Chief Complaint: No chief complaint on file.   History of Present Illness:  Joel Black is a 63 y.o. very pleasant male patient who presents with the following:  Virtual visit today due to pandemic Pt location is home, provider is at office  Pt ID confirmed with 2 identifiers and he gives consent for virtual visit today  Last visit here in March History of HTN treated with hctz and losartan   He has been doing some walking outdoors and pulled off several ticks recently -2 days ago he was walking through a wooded area, and when he got home with her about 8 ticks divided over both lower legs. The ticks were embedded but not engorged Some were tiny like deer ticks, some larger like dog ticks No strange rash or skin reaction No fever, he feels well   However [facility], and is concerned that this could happen again Yesterday he sprayed a DEET containing insect repellent on his feet and legs, and did not pick up any more ticks  Patient Active Problem List   Diagnosis Date Noted  . Myalgia 07/24/2017  . Vertigo 07/24/2017  . Arthralgia 07/24/2017  . Fatigue 07/24/2017  . Atypical chest pain 09/21/2015  . BRBPR (bright red blood per rectum)   . Stenosis, cervical spine 02/13/2015  . Shortness of breath 03/19/2014  . Solitary pulmonary nodule on lung CT 02/18/2014  . HTN (hypertension) 10/21/2012    Past Medical History:  Diagnosis Date  . Herniated cervical disc   . History of kidney stones   . Hypertension   . MRSA (methicillin resistant Staphylococcus aureus)   . Ulcer     No past surgical history on file.  Social History   Tobacco Use  . Smoking status: Former  Smoker    Packs/day: 2.00    Years: 15.00    Pack years: 30.00    Types: Cigarettes    Last attempt to quit: 06/11/1986    Years since quitting: 32.0  . Smokeless tobacco: Never Used  Substance Use Topics  . Alcohol use: Yes    Alcohol/week: 21.0 standard drinks    Types: 21 Glasses of wine per week    Comment: 3-4 glasses with dinner  . Drug use: No    Family History  Problem Relation Age of Onset  . Cancer Father        lung  . Diabetes Sister     No Known Allergies  Medication list has been reviewed and updated.  Current Outpatient Medications on File Prior to Visit  Medication Sig Dispense Refill  . hydrochlorothiazide (MICROZIDE) 12.5 MG capsule Take 1 capsule (12.5 mg total) by mouth daily. 90 capsule 3  . losartan (COZAAR) 100 MG tablet Take 1 tablet (100 mg total) by mouth daily. 90 tablet 3   No current facility-administered medications on file prior to visit.     Review of Systems:  As per HPI- otherwise negative. No fever chills  Physical Examination: There were no vitals filed for this visit. There were no vitals filed for this visit. There is no height or weight on file to calculate BMI. Ideal Body  Weight:    Virtual visit -patient observed over video.  He looks well, his normal self.  No cough, wheezing, shortness of breath  Assessment and Plan: Tick bite of lower leg, unspecified laterality, initial encounter - Plan: doxycycline (VIBRAMYCIN) 100 MG capsule  Virtual visit today to discuss multiple tick bites to the bilateral lower legs.  Certainly he may have no adverse reaction, but he did pull off 8 or 10 ticks.  He would be interested in using doxycycline prophylaxis.  I will have him take 200 mg of doxycycline x1 for prophylaxis against Lyme disease.  He has been instructed to watch for any skin changes or other symptoms of illness carefully.  If any concerns he will start on doxycycline 100 mg twice daily and alert me  Meds ordered this encounter   Medications  . doxycycline (VIBRAMYCIN) 100 MG capsule    Sig: Take 200 mg by mouth once to prevent lyme disease.  If any other symptoms continue 100 mg twice a day till gone and call MD    Dispense:  22 capsule    Refill:  0     Signed Lamar Blinks, MD

## 2018-09-05 DIAGNOSIS — M5124 Other intervertebral disc displacement, thoracic region: Secondary | ICD-10-CM | POA: Diagnosis not present

## 2018-09-05 DIAGNOSIS — M5126 Other intervertebral disc displacement, lumbar region: Secondary | ICD-10-CM | POA: Diagnosis not present

## 2018-09-05 DIAGNOSIS — M9902 Segmental and somatic dysfunction of thoracic region: Secondary | ICD-10-CM | POA: Diagnosis not present

## 2018-09-05 DIAGNOSIS — M5431 Sciatica, right side: Secondary | ICD-10-CM | POA: Diagnosis not present

## 2018-09-05 DIAGNOSIS — M9905 Segmental and somatic dysfunction of pelvic region: Secondary | ICD-10-CM | POA: Diagnosis not present

## 2018-09-05 DIAGNOSIS — M9903 Segmental and somatic dysfunction of lumbar region: Secondary | ICD-10-CM | POA: Diagnosis not present

## 2018-09-07 DIAGNOSIS — M9905 Segmental and somatic dysfunction of pelvic region: Secondary | ICD-10-CM | POA: Diagnosis not present

## 2018-09-07 DIAGNOSIS — M9902 Segmental and somatic dysfunction of thoracic region: Secondary | ICD-10-CM | POA: Diagnosis not present

## 2018-09-07 DIAGNOSIS — M9903 Segmental and somatic dysfunction of lumbar region: Secondary | ICD-10-CM | POA: Diagnosis not present

## 2018-09-07 DIAGNOSIS — M5124 Other intervertebral disc displacement, thoracic region: Secondary | ICD-10-CM | POA: Diagnosis not present

## 2018-09-07 DIAGNOSIS — M5431 Sciatica, right side: Secondary | ICD-10-CM | POA: Diagnosis not present

## 2018-09-07 DIAGNOSIS — M5126 Other intervertebral disc displacement, lumbar region: Secondary | ICD-10-CM | POA: Diagnosis not present

## 2018-09-08 ENCOUNTER — Ambulatory Visit (INDEPENDENT_AMBULATORY_CARE_PROVIDER_SITE_OTHER): Payer: Medicare HMO | Admitting: Family Medicine

## 2018-09-08 ENCOUNTER — Other Ambulatory Visit: Payer: Self-pay

## 2018-09-08 ENCOUNTER — Encounter: Payer: Self-pay | Admitting: Family Medicine

## 2018-09-08 VITALS — BP 134/80 | HR 92 | Temp 97.5°F | Resp 16 | Ht 71.0 in | Wt 195.0 lb

## 2018-09-08 DIAGNOSIS — M5416 Radiculopathy, lumbar region: Secondary | ICD-10-CM

## 2018-09-08 DIAGNOSIS — M5136 Other intervertebral disc degeneration, lumbar region: Secondary | ICD-10-CM

## 2018-09-08 DIAGNOSIS — R972 Elevated prostate specific antigen [PSA]: Secondary | ICD-10-CM | POA: Diagnosis not present

## 2018-09-08 MED ORDER — HYDROCODONE-ACETAMINOPHEN 5-325 MG PO TABS: 1.0000 | ORAL_TABLET | Freq: Three times a day (TID) | ORAL | 0 refills | Status: DC | PRN

## 2018-09-08 MED ORDER — PREDNISONE 20 MG PO TABS: ORAL_TABLET | ORAL | 0 refills | Status: DC

## 2018-09-08 MED ORDER — HYDROCODONE-ACETAMINOPHEN 5-325 MG PO TABS: 1.0000 | ORAL_TABLET | Freq: Three times a day (TID) | ORAL | 0 refills | Status: AC | PRN

## 2018-09-08 NOTE — Progress Notes (Signed)
[facility] [place], [place], [place] [address] [phone]  Date:  09/08/2018   Name:  Joel Black   DOB:  1955-08-18   MRN:  [id number]  PCP:  Darreld Mclean, MD    Chief Complaint: Back Pain (nerve pain, siatica, xray lower back at chiropractor)   History of Present Illness:  Joel Black is a 63 y.o. very pleasant male patient who presents with the following:  Patient with history of hypertension, cervical stenosis Last seen by myself virtually in June for tick bite Here today with concern of back pain- first noted about 2 weeks ago, went away but then came back, he notes pain in the left lower back and down the left leg   Earlier this week he visited the chiro who takes care of his wife- they did some x-rays. 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 Few other smaller bilateral nodules all unchanged from 2015. These have been stable for greater than 2 years and can be considered benign. No new nodules. No acute findings .  Colon cancer screening- he has cologuard at home, he promises to complete this test Pneumonia series Flu shot-defers were using steroids today Complete labs done in March-repeat PSA today   HCTZ 12.5 Losartan 100 He does not have diabetes Patient Active Problem  List   Diagnosis Date Noted  . Myalgia 07/24/2017  . Vertigo 07/24/2017  . Arthralgia 07/24/2017  . Fatigue 07/24/2017  . Atypical chest pain 09/21/2015  . BRBPR (bright red blood per rectum)   . Stenosis, cervical spine 02/13/2015  . Shortness of breath 03/19/2014  . Solitary pulmonary nodule on lung CT 02/18/2014  . HTN (hypertension) 10/21/2012    Past Medical History:  Diagnosis Date  . Herniated cervical disc   . History of kidney stones   . Hypertension   . MRSA (methicillin resistant Staphylococcus aureus)   . Ulcer     No past surgical history on file.  Social History   Tobacco Use  . Smoking status: Former Smoker    Packs/day: 2.00    Years: 15.00    Pack years: 30.00    Types: Cigarettes    Quit date: 06/11/1986    Years since quitting: 32.2  . Smokeless tobacco: Never Used  Substance Use Topics  . Alcohol use: Yes    Alcohol/week: 21.0 standard drinks    Types: 21 Glasses of wine per week    Comment: 3-4 glasses with dinner  . Drug use: No    Family History  Problem Relation Age of Onset  . Cancer Father        lung  . Diabetes Sister     No Known Allergies  Medication list has been reviewed and updated.  Current Outpatient Medications on File  Prior to Visit  Medication Sig Dispense Refill  . doxycycline (VIBRAMYCIN) 100 MG capsule Take 200 mg by mouth once to prevent lyme disease.  If any other symptoms continue 100 mg twice a day till gone and call MD 22 capsule 0  . hydrochlorothiazide (MICROZIDE) 12.5 MG capsule Take 1 capsule (12.5 mg total) by mouth daily. 90 capsule 3  . losartan (COZAAR) 100 MG tablet Take 1 tablet (100 mg total) by mouth daily. 90 tablet 3   No current facility-administered medications on file prior to visit.     Review of Systems:  As per HPI- otherwise negative.   Physical Examination: Vitals:   09/08/18 1535  BP: 134/80  Pulse: 92  Resp: 16  Temp: (!) 97.5 F (36.4 C)  SpO2: 98%   Vitals:   09/08/18  1535  Weight: 195 lb (88.5 kg)  Height: 5\' 11"  (1.803 m)   Body mass index is 27.2 kg/m. Ideal Body Weight: Weight in (lb) to have BMI = 25: 178.9  GEN: WDWN, NAD, Non-toxic, A & O x 3, looks well, normal weight  HEENT: Atraumatic, Normocephalic. Neck supple. No masses, No LAD. Ears and Nose: No external deformity. CV: RRR, No M/G/R. No JVD. No thrill. No extra heart sounds. PULM: CTA B, no wheezes, crackles, rhonchi. No retractions. No resp. distress. No accessory muscle use. EXTR: No c/c/e NEURO Normal gait.  PSYCH: Normally interactive. Conversant. Not depressed or anxious appearing.  Calm demeanor.  He notes tenderness at the left-sided sciatic notch.  No bony tenderness of the spine is apparent.  Normal flexion and extension of the thoracic or lumbar spine.  Normal strength, sensation, DTR of both lower extremities.  Positive straight leg raise on the left only   Assessment and Plan:   ICD-10-CM   1. Lumbar back pain with radiculopathy affecting left lower extremity  M54.16 predniSONE (DELTASONE) 20 MG tablet    HYDROcodone-acetaminophen (NORCO/VICODIN) 5-325 MG tablet    DISCONTINUED: HYDROcodone-acetaminophen (NORCO/VICODIN) 5-325 MG tablet  2. Increased prostate specific antigen (PSA) velocity  R97.20 PSA  3. Lumbar degenerative disc disease  M51.36 predniSONE (DELTASONE) 20 MG tablet    HYDROcodone-acetaminophen (NORCO/VICODIN) 5-325 MG tablet    DISCONTINUED: HYDROcodone-acetaminophen (NORCO/VICODIN) 5-325 MG tablet   Repeat PSA to follow trend today Recurrent lower back pain, recent exams from outside providers show significant degenerative change at L3-L4.  Suspect he has nerve impingement.  Will treat with a course of prednisone and also gave hydrocodone to use for pain as necessary He plans to see his chiropractor again next week, will keep me posted as to his progress.  If not improving, consider MRI  Good to see you today!  Please go to the lab for a repeat PSA  level For your back-  Heat or ice, frequent movement, gentle stretching Prednisone - steroid- for nerve compression Hydrocodone for pain- watch for sedation, don't combine with alcohol or motor vehicles!  Let me know how you are doing next week- Sooner if worse.   Follow-up: No follow-ups on file.  Meds ordered this encounter  Medications  . DISCONTD: HYDROcodone-acetaminophen (NORCO/VICODIN) 5-325 MG tablet    Sig: Take 1-2 tablets by mouth every 8 (eight) hours as needed for up to 5 days.    Dispense:  20 tablet    Refill:  0  . predniSONE (DELTASONE) 20 MG tablet    Sig: Take 2 pills (40mg ) a day for 5 days, then 1 pill (20mg ) a day for 5 days  Dispense:  15 tablet    Refill:  0  . HYDROcodone-acetaminophen (NORCO/VICODIN) 5-325 MG tablet    Sig: Take 1-2 tablets by mouth every 8 (eight) hours as needed for up to 5 days.    Dispense:  20 tablet    Refill:  0   Orders Placed This Encounter  Procedures  . PSA    @SIGN @    Signed Abbe AmsterdamJessica Ahmed Inniss, MD

## 2018-09-08 NOTE — Patient Instructions (Signed)
Good to see you today!  Please go to the lab for a repeat PSA level For your back-  Heat or ice, frequent movement, gentle stretching Prednisone - steroid- for nerve compression Hydrocodone for pain- watch for sedation, don't combine with alcohol or motor vehicles!  Let me know how you are doing next week- Sooner if worse.

## 2018-09-12 DIAGNOSIS — M5124 Other intervertebral disc displacement, thoracic region: Secondary | ICD-10-CM | POA: Diagnosis not present

## 2018-09-12 DIAGNOSIS — M9903 Segmental and somatic dysfunction of lumbar region: Secondary | ICD-10-CM | POA: Diagnosis not present

## 2018-09-12 DIAGNOSIS — M9905 Segmental and somatic dysfunction of pelvic region: Secondary | ICD-10-CM | POA: Diagnosis not present

## 2018-09-12 DIAGNOSIS — M5431 Sciatica, right side: Secondary | ICD-10-CM | POA: Diagnosis not present

## 2018-09-12 DIAGNOSIS — M5126 Other intervertebral disc displacement, lumbar region: Secondary | ICD-10-CM | POA: Diagnosis not present

## 2018-09-12 DIAGNOSIS — M9902 Segmental and somatic dysfunction of thoracic region: Secondary | ICD-10-CM | POA: Diagnosis not present

## 2018-09-15 DIAGNOSIS — M5124 Other intervertebral disc displacement, thoracic region: Secondary | ICD-10-CM | POA: Diagnosis not present

## 2018-09-15 DIAGNOSIS — M9905 Segmental and somatic dysfunction of pelvic region: Secondary | ICD-10-CM | POA: Diagnosis not present

## 2018-09-15 DIAGNOSIS — M9903 Segmental and somatic dysfunction of lumbar region: Secondary | ICD-10-CM | POA: Diagnosis not present

## 2018-09-15 DIAGNOSIS — M9902 Segmental and somatic dysfunction of thoracic region: Secondary | ICD-10-CM | POA: Diagnosis not present

## 2018-09-15 DIAGNOSIS — M5126 Other intervertebral disc displacement, lumbar region: Secondary | ICD-10-CM | POA: Diagnosis not present

## 2018-09-15 DIAGNOSIS — M5431 Sciatica, right side: Secondary | ICD-10-CM | POA: Diagnosis not present

## 2018-09-19 DIAGNOSIS — M9902 Segmental and somatic dysfunction of thoracic region: Secondary | ICD-10-CM | POA: Diagnosis not present

## 2018-09-19 DIAGNOSIS — M5126 Other intervertebral disc displacement, lumbar region: Secondary | ICD-10-CM | POA: Diagnosis not present

## 2018-09-19 DIAGNOSIS — M9905 Segmental and somatic dysfunction of pelvic region: Secondary | ICD-10-CM | POA: Diagnosis not present

## 2018-09-19 DIAGNOSIS — M5431 Sciatica, right side: Secondary | ICD-10-CM | POA: Diagnosis not present

## 2018-09-19 DIAGNOSIS — M5124 Other intervertebral disc displacement, thoracic region: Secondary | ICD-10-CM | POA: Diagnosis not present

## 2018-09-19 DIAGNOSIS — M9903 Segmental and somatic dysfunction of lumbar region: Secondary | ICD-10-CM | POA: Diagnosis not present

## 2018-09-21 DIAGNOSIS — M5126 Other intervertebral disc displacement, lumbar region: Secondary | ICD-10-CM | POA: Diagnosis not present

## 2018-09-21 DIAGNOSIS — M5124 Other intervertebral disc displacement, thoracic region: Secondary | ICD-10-CM | POA: Diagnosis not present

## 2018-09-21 DIAGNOSIS — M9902 Segmental and somatic dysfunction of thoracic region: Secondary | ICD-10-CM | POA: Diagnosis not present

## 2018-09-21 DIAGNOSIS — M9903 Segmental and somatic dysfunction of lumbar region: Secondary | ICD-10-CM | POA: Diagnosis not present

## 2018-09-21 DIAGNOSIS — M5431 Sciatica, right side: Secondary | ICD-10-CM | POA: Diagnosis not present

## 2018-09-21 DIAGNOSIS — M9905 Segmental and somatic dysfunction of pelvic region: Secondary | ICD-10-CM | POA: Diagnosis not present

## 2018-09-27 DIAGNOSIS — M5126 Other intervertebral disc displacement, lumbar region: Secondary | ICD-10-CM | POA: Diagnosis not present

## 2018-09-27 DIAGNOSIS — M9905 Segmental and somatic dysfunction of pelvic region: Secondary | ICD-10-CM | POA: Diagnosis not present

## 2018-09-27 DIAGNOSIS — M5431 Sciatica, right side: Secondary | ICD-10-CM | POA: Diagnosis not present

## 2018-09-27 DIAGNOSIS — M5124 Other intervertebral disc displacement, thoracic region: Secondary | ICD-10-CM | POA: Diagnosis not present

## 2018-09-27 DIAGNOSIS — M9903 Segmental and somatic dysfunction of lumbar region: Secondary | ICD-10-CM | POA: Diagnosis not present

## 2018-09-27 DIAGNOSIS — M9902 Segmental and somatic dysfunction of thoracic region: Secondary | ICD-10-CM | POA: Diagnosis not present

## 2018-10-03 DIAGNOSIS — M5431 Sciatica, right side: Secondary | ICD-10-CM | POA: Diagnosis not present

## 2018-10-03 DIAGNOSIS — M9905 Segmental and somatic dysfunction of pelvic region: Secondary | ICD-10-CM | POA: Diagnosis not present

## 2018-10-03 DIAGNOSIS — M5124 Other intervertebral disc displacement, thoracic region: Secondary | ICD-10-CM | POA: Diagnosis not present

## 2018-10-03 DIAGNOSIS — M5126 Other intervertebral disc displacement, lumbar region: Secondary | ICD-10-CM | POA: Diagnosis not present

## 2018-10-03 DIAGNOSIS — M9903 Segmental and somatic dysfunction of lumbar region: Secondary | ICD-10-CM | POA: Diagnosis not present

## 2018-10-03 DIAGNOSIS — M9902 Segmental and somatic dysfunction of thoracic region: Secondary | ICD-10-CM | POA: Diagnosis not present

## 2018-10-17 DIAGNOSIS — M9902 Segmental and somatic dysfunction of thoracic region: Secondary | ICD-10-CM | POA: Diagnosis not present

## 2018-10-17 DIAGNOSIS — M5126 Other intervertebral disc displacement, lumbar region: Secondary | ICD-10-CM | POA: Diagnosis not present

## 2018-10-17 DIAGNOSIS — M9905 Segmental and somatic dysfunction of pelvic region: Secondary | ICD-10-CM | POA: Diagnosis not present

## 2018-10-17 DIAGNOSIS — M5431 Sciatica, right side: Secondary | ICD-10-CM | POA: Diagnosis not present

## 2018-10-17 DIAGNOSIS — M5124 Other intervertebral disc displacement, thoracic region: Secondary | ICD-10-CM | POA: Diagnosis not present

## 2018-10-17 DIAGNOSIS — M9903 Segmental and somatic dysfunction of lumbar region: Secondary | ICD-10-CM | POA: Diagnosis not present

## 2018-11-07 DIAGNOSIS — M5124 Other intervertebral disc displacement, thoracic region: Secondary | ICD-10-CM | POA: Diagnosis not present

## 2018-11-07 DIAGNOSIS — M9902 Segmental and somatic dysfunction of thoracic region: Secondary | ICD-10-CM | POA: Diagnosis not present

## 2018-11-07 DIAGNOSIS — M9905 Segmental and somatic dysfunction of pelvic region: Secondary | ICD-10-CM | POA: Diagnosis not present

## 2018-11-07 DIAGNOSIS — M5126 Other intervertebral disc displacement, lumbar region: Secondary | ICD-10-CM | POA: Diagnosis not present

## 2018-11-07 DIAGNOSIS — M9903 Segmental and somatic dysfunction of lumbar region: Secondary | ICD-10-CM | POA: Diagnosis not present

## 2018-11-07 DIAGNOSIS — M5431 Sciatica, right side: Secondary | ICD-10-CM | POA: Diagnosis not present

## 2019-01-21 ENCOUNTER — Other Ambulatory Visit: Payer: Self-pay | Admitting: Family Medicine

## 2019-03-19 DIAGNOSIS — R300 Dysuria: Secondary | ICD-10-CM | POA: Diagnosis not present

## 2019-10-27 DIAGNOSIS — Z833 Family history of diabetes mellitus: Secondary | ICD-10-CM | POA: Diagnosis not present

## 2019-10-27 DIAGNOSIS — H547 Unspecified visual loss: Secondary | ICD-10-CM | POA: Diagnosis not present

## 2019-10-27 DIAGNOSIS — Z87891 Personal history of nicotine dependence: Secondary | ICD-10-CM | POA: Diagnosis not present

## 2019-10-27 DIAGNOSIS — Z825 Family history of asthma and other chronic lower respiratory diseases: Secondary | ICD-10-CM | POA: Diagnosis not present

## 2019-10-27 DIAGNOSIS — Z7722 Contact with and (suspected) exposure to environmental tobacco smoke (acute) (chronic): Secondary | ICD-10-CM | POA: Diagnosis not present

## 2019-10-27 DIAGNOSIS — Z008 Encounter for other general examination: Secondary | ICD-10-CM | POA: Diagnosis not present

## 2019-10-27 DIAGNOSIS — I10 Essential (primary) hypertension: Secondary | ICD-10-CM | POA: Diagnosis not present

## 2019-10-27 DIAGNOSIS — Z791 Long term (current) use of non-steroidal anti-inflammatories (NSAID): Secondary | ICD-10-CM | POA: Diagnosis not present

## 2019-10-27 DIAGNOSIS — G8929 Other chronic pain: Secondary | ICD-10-CM | POA: Diagnosis not present

## 2020-02-29 ENCOUNTER — Other Ambulatory Visit: Payer: Self-pay | Admitting: Family Medicine

## 2020-02-29 NOTE — Telephone Encounter (Signed)
Pt has not been seen since 2020, no pending appointments okay to refuse?

## 2020-03-06 ENCOUNTER — Telehealth: Payer: Self-pay | Admitting: *Deleted

## 2020-03-06 NOTE — Telephone Encounter (Signed)
Called pt -he has enough on hand for at least a week.  I advised him I am not sure if this is a widespread shortage, or if another pharmacy might be able to fill his prescription.  He will let me know if not able to get losartan by the time he needs more, in that case we can do a substitution or try at the pharmacy

## 2020-03-06 NOTE — Telephone Encounter (Signed)
[facility] faxed over stating that all strengths of losartan are on backorder with no release date.

## 2020-03-07 NOTE — Telephone Encounter (Signed)
Patient called to speak to the  nurse in regards to the Losrtan  He would like a call back his number is [phone]

## 2020-03-08 NOTE — Telephone Encounter (Signed)
Left message to return call 

## 2020-04-09 ENCOUNTER — Other Ambulatory Visit: Payer: Self-pay | Admitting: Family Medicine

## 2020-06-11 ENCOUNTER — Other Ambulatory Visit: Payer: Self-pay | Admitting: Family Medicine

## 2020-07-17 ENCOUNTER — Other Ambulatory Visit: Payer: Self-pay | Admitting: Family Medicine

## 2020-08-12 DIAGNOSIS — Z87891 Personal history of nicotine dependence: Secondary | ICD-10-CM | POA: Diagnosis not present

## 2020-08-12 DIAGNOSIS — Z809 Family history of malignant neoplasm, unspecified: Secondary | ICD-10-CM | POA: Diagnosis not present

## 2020-08-12 DIAGNOSIS — Z791 Long term (current) use of non-steroidal anti-inflammatories (NSAID): Secondary | ICD-10-CM | POA: Diagnosis not present

## 2020-08-12 DIAGNOSIS — I10 Essential (primary) hypertension: Secondary | ICD-10-CM | POA: Diagnosis not present

## 2020-08-16 IMAGING — DX DG CHEST 2V
2 series · 2 of 2 positions shown · non-contrast
Comparison: 01/07/2017

CLINICAL DATA: Cough and congestion.

EXAM:
CHEST - 2 VIEW

[chest pa]
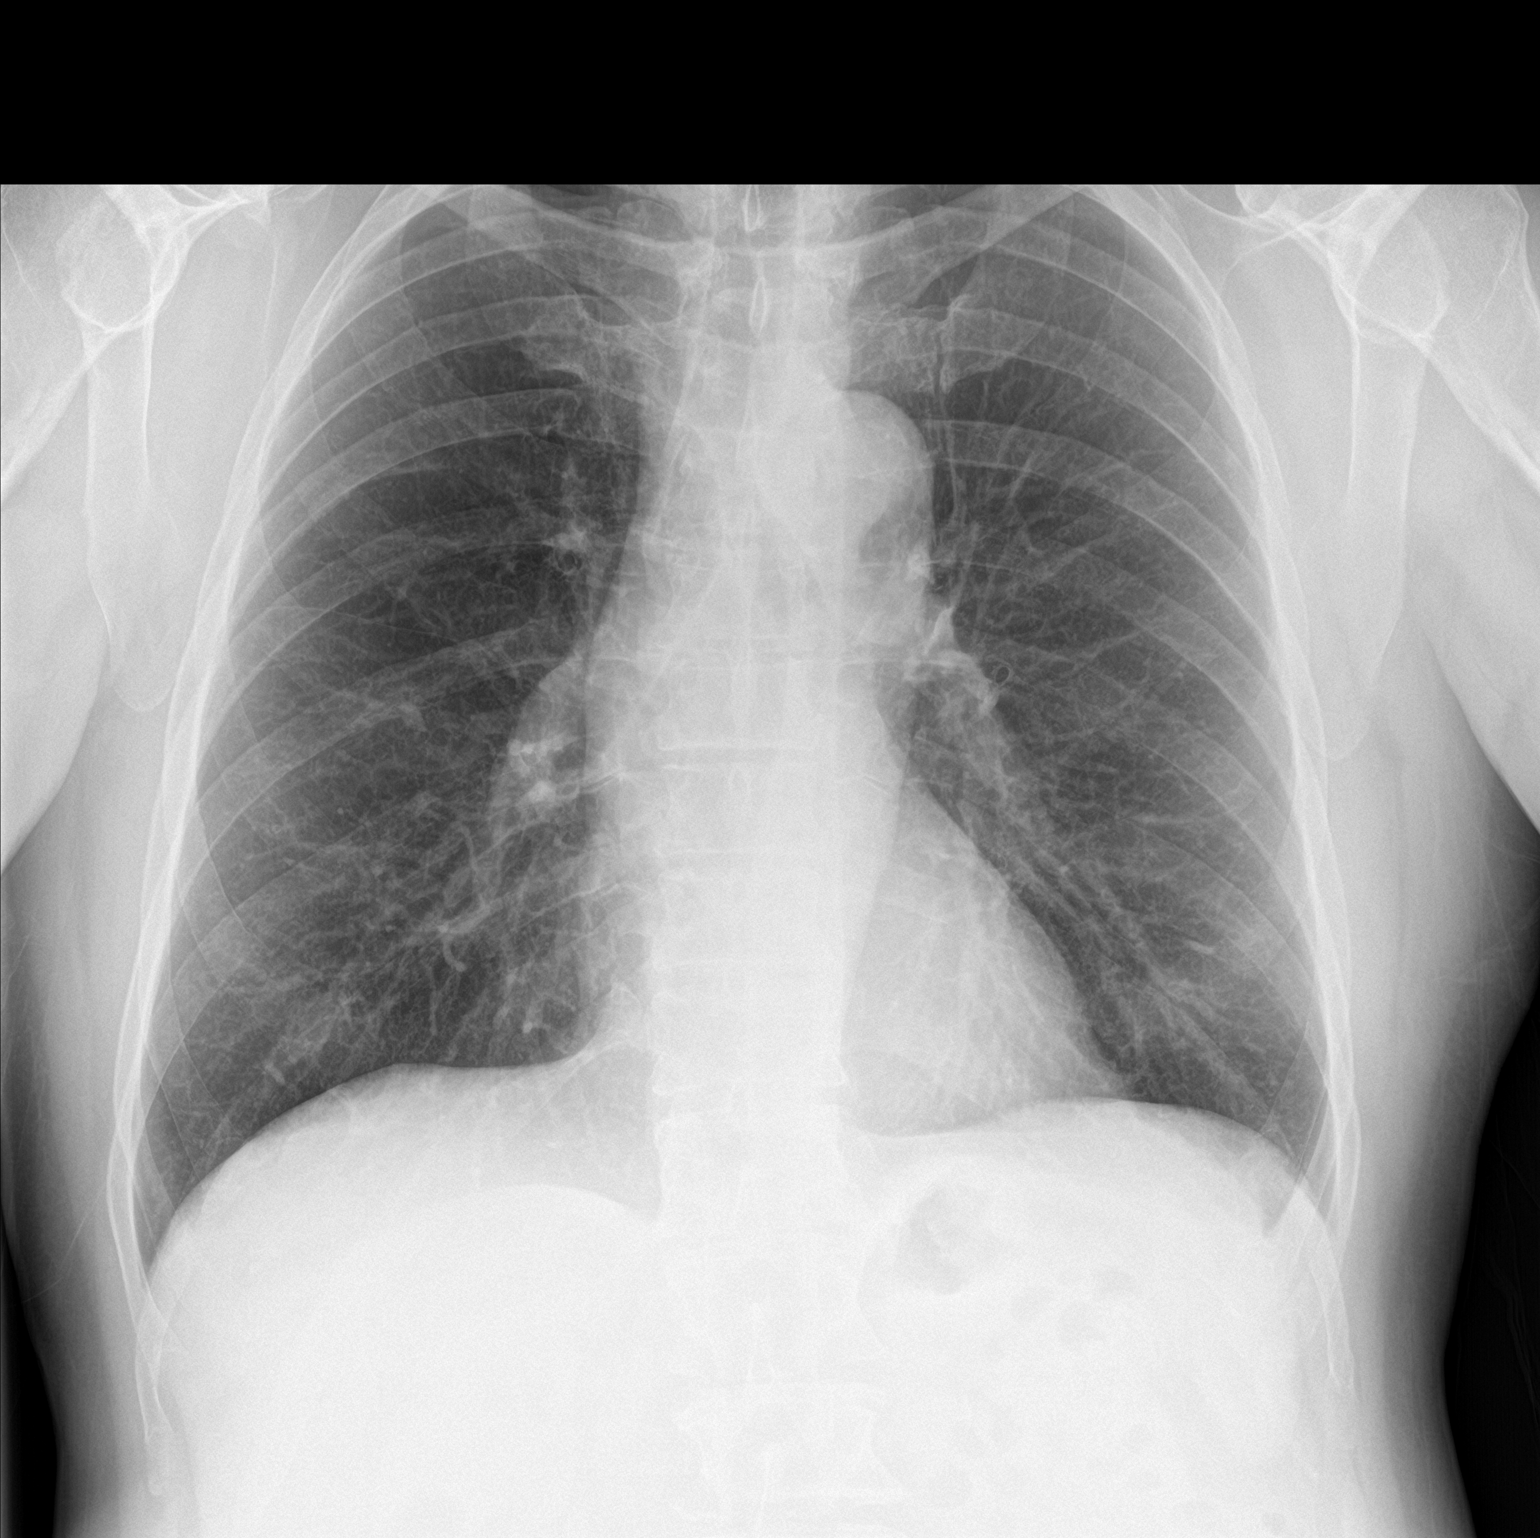

[chest lat]
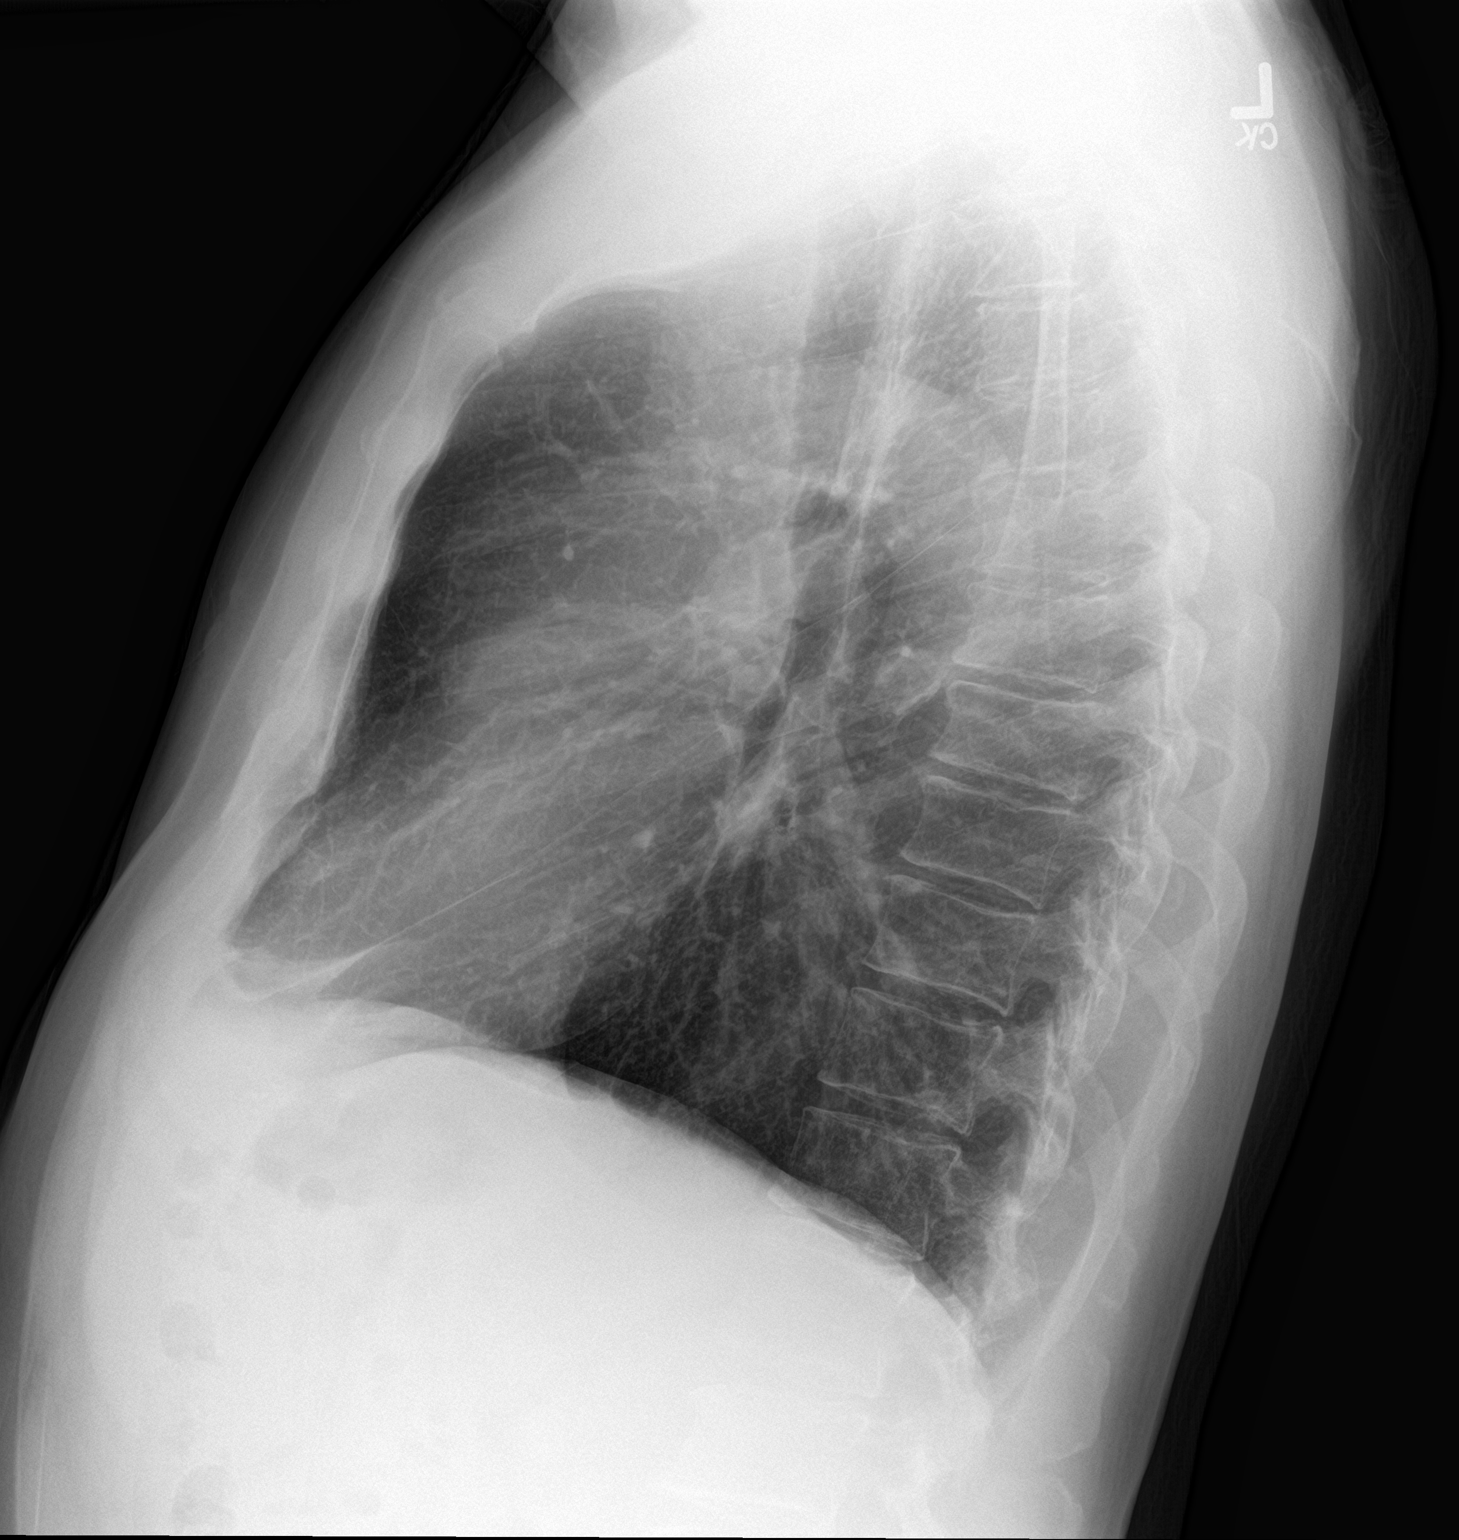

[2 of 2 positions shown; findings below may reference images not displayed]

FINDINGS: In the cardiac silhouette, mediastinal and hilar contours are within
normal limits and stable. There is mild tortuosity of the thoracic
aorta. The lungs are clear. Stable mild apical pleural thickening
bilaterally. No pleural effusion or pulmonary infiltrate. No
worrisome pulmonary lesions. The bony thorax is intact.
IMPRESSION: No acute cardiopulmonary findings.

## 2020-09-03 DIAGNOSIS — M5124 Other intervertebral disc displacement, thoracic region: Secondary | ICD-10-CM | POA: Diagnosis not present

## 2020-09-03 DIAGNOSIS — M5431 Sciatica, right side: Secondary | ICD-10-CM | POA: Diagnosis not present

## 2020-09-03 DIAGNOSIS — M5126 Other intervertebral disc displacement, lumbar region: Secondary | ICD-10-CM | POA: Diagnosis not present

## 2020-09-03 DIAGNOSIS — M9905 Segmental and somatic dysfunction of pelvic region: Secondary | ICD-10-CM | POA: Diagnosis not present

## 2020-09-03 DIAGNOSIS — M9903 Segmental and somatic dysfunction of lumbar region: Secondary | ICD-10-CM | POA: Diagnosis not present

## 2020-09-03 DIAGNOSIS — M9902 Segmental and somatic dysfunction of thoracic region: Secondary | ICD-10-CM | POA: Diagnosis not present

## 2020-09-08 ENCOUNTER — Other Ambulatory Visit: Payer: Self-pay | Admitting: Family Medicine

## 2020-10-10 ENCOUNTER — Ambulatory Visit: Payer: Medicare HMO | Admitting: Family Medicine

## 2020-10-13 NOTE — Progress Notes (Signed)
[place] [facility] [address], [place], [place] [address] [phone] [id number]  Date:  10/16/2020   Name:  Joel Black   DOB:  1955-02-17   MRN:  [id number]  PCP:  Darreld Mclean, MD    Chief Complaint: Medication Refill (Concerns/ questions: pt says he feels like he is recovering from covid well./Flu shot today: declines. /)   History of Present Illness:  Joel Black is a 65 y.o. very pleasant male patient who presents with the following:  Patient seen today for periodic follow-up visit Last seen by myself about 2 years ago History of hypertension, cervical stenosis This blood pressure is treated with losartan and HCTZ.  Admits to spacing his medications out recently to make them last.  Otherwise, tolerating well and blood pressures under good control  He was in [place] for a while- he got covid about 4 weeks ago.  He feels like he is back to normal  This was his first covid infection  COVID series-recommended Colon cancer screening-Per patient, he has a testing kit at home.  I encouraged him to complete this ASAP Shingrix-recommended Flu vaccine-recommended Most recent labs done in March 2020  He has developed dupuytren's contracture in both hands, more on the left.  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  Social History   Tobacco Use   Smoking  status: Former    Packs/day: 2.00    Years: 15.00    Pack years: 30.00    Types: Cigarettes    Quit date: 06/11/1986    Years since quitting: 34.3   Smokeless tobacco: Never  Substance Use Topics   Alcohol use: Yes    Alcohol/week: 21.0 standard drinks    Types: 21 Glasses of wine per week    Comment: 3-4 glasses with dinner   Drug use: No    Family History  Problem Relation Age of Onset   Cancer Father        lung   Diabetes Sister     No Known Allergies  Medication list has been reviewed and updated.  Current Outpatient Medications on File Prior to Visit  Medication Sig Dispense Refill   hydrochlorothiazide (MICROZIDE) 12.5 MG capsule TAKE 1 CAPSULE BY MOUTH EVERY DAY 90 capsule 0   losartan (COZAAR) 100 MG tablet TAKE 1 TABLET BY MOUTH EVERY DAY. Pt needs OV for further refills 14 tablet 0   No current facility-administered medications on file prior to visit.    Review of Systems:  As per HPI- otherwise negative.   Physical Examination: Vitals:   10/16/20 1610  BP: 130/78  Pulse: 83  Resp: 18  Temp: 98.1 F (36.7 C)  SpO2: 98%   Vitals:   10/16/20 1610  Weight: 187 lb 6.4 oz (85 kg)  Height: 5' 11.5" (1.816 m)  Body mass index is 25.77 kg/m. Ideal Body Weight: Weight in (lb) to have BMI = 25: 181.4  GEN: no acute distress.  Minimal overweight, looks well HEENT: Atraumatic, Normocephalic.  Ears and Nose: No external deformity. CV: RRR, No M/G/R. No JVD. No thrill. No extra heart sounds. PULM: CTA B, no wheezes, crackles, rhonchi. No retractions. No resp. distress. No accessory muscle use. ABD: S, NT, ND. No rebound. No HSM. EXTR: No c/c/e PSYCH: Normally interactive. Conversant.  He has significant contracture of the left hand, involving the ring and pinky fingers.  There is also contracture of the right hand, to a lesser extent and mostly involving the pinky finger  Assessment and Plan: Hypertension, unspecified type - Plan: hydrochlorothiazide  (MICROZIDE) 12.5 MG capsule, losartan (COZAAR) 100 MG tablet, CBC, Comprehensive metabolic panel, CANCELED: CBC, CANCELED: Comprehensive metabolic panel  Dupuytren's contracture of both hands - Plan: Ambulatory referral to Hand Surgery  Screening for prostate cancer - Plan: PSA, CANCELED: PSA  Screening for diabetes mellitus - Plan: Comprehensive metabolic panel, Hemoglobin A1c, CANCELED: Comprehensive metabolic panel, CANCELED: Hemoglobin A1c  Screening for hyperlipidemia - Plan: Lipid panel, CANCELED: Lipid panel  Following up today after a couple of year absence.  Labs are pending as above.  Refill blood pressure medications.  Discussed and encouraged screening tests and appropriate immunizations  Referral to hand surgery to discuss palmar contracture of both hands This visit occurred during the SARS-CoV-2 public health emergency.  Safety protocols were in place, including screening questions prior to the visit, additional usage of staff PPE, and extensive cleaning of exam room while observing appropriate contact time as indicated for disinfecting solutions.   Signed Lamar Blinks, MD

## 2020-10-16 ENCOUNTER — Ambulatory Visit (INDEPENDENT_AMBULATORY_CARE_PROVIDER_SITE_OTHER): Payer: Medicare HMO | Admitting: Family Medicine

## 2020-10-16 ENCOUNTER — Other Ambulatory Visit: Payer: Self-pay

## 2020-10-16 VITALS — BP 130/78 | HR 83 | Temp 98.1°F | Resp 18 | Ht 71.5 in | Wt 187.4 lb

## 2020-10-16 DIAGNOSIS — Z125 Encounter for screening for malignant neoplasm of prostate: Secondary | ICD-10-CM

## 2020-10-16 DIAGNOSIS — Z131 Encounter for screening for diabetes mellitus: Secondary | ICD-10-CM | POA: Diagnosis not present

## 2020-10-16 DIAGNOSIS — M720 Palmar fascial fibromatosis [Dupuytren]: Secondary | ICD-10-CM | POA: Diagnosis not present

## 2020-10-16 DIAGNOSIS — I10 Essential (primary) hypertension: Secondary | ICD-10-CM

## 2020-10-16 DIAGNOSIS — Z13220 Encounter for screening for lipoid disorders: Secondary | ICD-10-CM | POA: Diagnosis not present

## 2020-10-16 MED ORDER — HYDROCHLOROTHIAZIDE 12.5 MG PO CAPS: ORAL_CAPSULE | ORAL | 3 refills | Status: DC

## 2020-10-16 MED ORDER — LOSARTAN POTASSIUM 100 MG PO TABS: ORAL_TABLET | ORAL | 3 refills | Status: DC

## 2020-10-16 NOTE — Patient Instructions (Signed)
Good to see you again today!  I will be in touch with your labs asap BP looks ok- continue current medications We will get you set up with a hand surgeon to discuss your hand issue  I do recommend that you get a flu vaccine, covid series if not done, and shingles series as well as your colon cancer screening!    Take care

## 2020-10-17 ENCOUNTER — Other Ambulatory Visit (INDEPENDENT_AMBULATORY_CARE_PROVIDER_SITE_OTHER): Payer: Medicare HMO

## 2020-10-17 DIAGNOSIS — Z131 Encounter for screening for diabetes mellitus: Secondary | ICD-10-CM | POA: Diagnosis not present

## 2020-10-17 DIAGNOSIS — Z13220 Encounter for screening for lipoid disorders: Secondary | ICD-10-CM | POA: Diagnosis not present

## 2020-10-17 DIAGNOSIS — Z125 Encounter for screening for malignant neoplasm of prostate: Secondary | ICD-10-CM

## 2020-10-17 DIAGNOSIS — I10 Essential (primary) hypertension: Secondary | ICD-10-CM | POA: Diagnosis not present

## 2020-10-18 ENCOUNTER — Encounter: Payer: Self-pay | Admitting: Family Medicine

## 2020-10-18 LAB — COMPREHENSIVE METABOLIC PANEL
ALT: 17 U/L (ref 0–53)
AST: 16 U/L (ref 0–37)
Albumin: 4.4 g/dL (ref 3.5–5.2)
Alkaline Phosphatase: 66 U/L (ref 39–117)
BUN: 18 mg/dL (ref 6–23)
CO2: 27 mEq/L (ref 19–32)
Calcium: 9.6 mg/dL (ref 8.4–10.5)
Chloride: 103 mEq/L (ref 96–112)
Creatinine, Ser: 1.09 mg/dL (ref 0.40–1.50)
GFR: 70.32 mL/min (ref >60.00)
Glucose, Bld: 95 mg/dL (ref 70–99)
Potassium: 4.2 mEq/L (ref 3.5–5.1)
Sodium: 139 mEq/L (ref 135–145)
Total Bilirubin: 0.6 mg/dL (ref 0.2–1.2)
Total Protein: 7.3 g/dL (ref 6.0–8.3)

## 2020-10-18 LAB — CBC
HCT: 43.9 % (ref 39.0–52.0)
Hemoglobin: 14.9 g/dL (ref 13.0–17.0)
MCHC: 33.9 g/dL (ref 30.0–36.0)
MCV: 97.5 fl (ref 78.0–100.0)
Platelets: 281 10*3/uL (ref 150.0–400.0)
RBC: 4.5 Mil/uL (ref 4.22–5.81)
RDW: 13.1 % (ref 11.5–15.5)
WBC: 7.7 10*3/uL (ref 4.0–10.5)

## 2020-10-18 LAB — LIPID PANEL
Cholesterol: 253 mg/dL — ABNORMAL HIGH (ref 0–200)
HDL: 54.7 mg/dL (ref >39.00)
NonHDL: 197.98
Total CHOL/HDL Ratio: 5
Triglycerides: 292 mg/dL — ABNORMAL HIGH (ref 0.0–149.0)
VLDL: 58.4 mg/dL — ABNORMAL HIGH (ref 0.0–40.0)

## 2020-10-18 LAB — HEMOGLOBIN A1C: Hgb A1c MFr Bld: 5.8 % (ref 4.6–6.5)

## 2020-10-18 LAB — PSA: PSA: 2.01 ng/mL (ref 0.10–4.00)

## 2020-10-18 LAB — LDL CHOLESTEROL, DIRECT: Direct LDL: 156 mg/dL

## 2020-10-18 NOTE — Progress Notes (Signed)
Results for orders placed or performed in visit on 10/17/20  PSA  Result Value Ref Range   PSA 2.01 0.10 - 4.00 ng/mL  Lipid panel  Result Value Ref Range   Cholesterol 253 (H) 0 - 200 mg/dL   Triglycerides 256.3 (H) 0.0 - 149.0 mg/dL   HDL 89.37 >34.28 mg/dL   VLDL 76.8 (H) 0.0 - 11.5 mg/dL   Total CHOL/HDL Ratio 5    NonHDL 197.98   Hemoglobin A1c  Result Value Ref Range   Hgb A1c MFr Bld 5.8 4.6 - 6.5 %  Comprehensive metabolic panel  Result Value Ref Range   Sodium 139 135 - 145 mEq/L   Potassium 4.2 3.5 - 5.1 mEq/L   Chloride 103 96 - 112 mEq/L   CO2 27 19 - 32 mEq/L   Glucose, Bld 95 70 - 99 mg/dL   BUN 18 6 - 23 mg/dL   Creatinine, Ser 7.26 0.40 - 1.50 mg/dL   Total Bilirubin 0.6 0.2 - 1.2 mg/dL   Alkaline Phosphatase 66 39 - 117 U/L   AST 16 0 - 37 U/L   ALT 17 0 - 53 U/L   Total Protein 7.3 6.0 - 8.3 g/dL   Albumin 4.4 3.5 - 5.2 g/dL   GFR 20.35 >59.74 mL/min   Calcium 9.6 8.4 - 10.5 mg/dL  CBC  Result Value Ref Range   WBC 7.7 4.0 - 10.5 K/uL   RBC 4.50 4.22 - 5.81 Mil/uL   Platelets 281.0 150.0 - 400.0 K/uL   Hemoglobin 14.9 13.0 - 17.0 g/dL   HCT 16.3 84.5 - 36.4 %   MCV 97.5 78.0 - 100.0 fl   MCHC 33.9 30.0 - 36.0 g/dL   RDW 68.0 32.1 - 22.4 %  LDL cholesterol, direct  Result Value Ref Range   Direct LDL 156.0 mg/dL    Lab Results  Component Value Date   PSA 2.01 10/17/2020   PSA 1.22 03/30/2018   PSA 0.83 08/13/2016

## 2020-10-28 ENCOUNTER — Telehealth: Payer: Self-pay

## 2020-10-28 DIAGNOSIS — E785 Hyperlipidemia, unspecified: Secondary | ICD-10-CM

## 2020-10-28 MED ORDER — ROSUVASTATIN CALCIUM 20 MG PO TABS: 20.0000 mg | ORAL_TABLET | Freq: Every day | ORAL | 3 refills | Status: DC

## 2020-10-28 NOTE — Telephone Encounter (Signed)
Pt received letter in the mail regarding cholesterol being high and wanting to know if ok for PCP to call in medication for it.  Per pt, it is ok for PCP to prescribed medication for this to his pharmacy, [facility] [address]

## 2020-10-28 NOTE — Telephone Encounter (Signed)
Okay for Rx?  

## 2020-10-29 NOTE — Telephone Encounter (Signed)
error 

## 2020-11-08 ENCOUNTER — Telehealth: Payer: Self-pay | Admitting: Family Medicine

## 2020-11-08 NOTE — Telephone Encounter (Signed)
Pt is aware and will let us know what dose he ends up on.

## 2020-11-08 NOTE — Telephone Encounter (Signed)
Pt is currently having side effects from med(rosuvastatin (CRESTOR) 20 MG tablet ) pt voiced he is experiencing dizziness and aching joints. Please advise.

## 2020-11-08 NOTE — Telephone Encounter (Signed)
Pt started the medication on 10/28/20. Please advise.

## 2020-12-11 DIAGNOSIS — M720 Palmar fascial fibromatosis [Dupuytren]: Secondary | ICD-10-CM | POA: Diagnosis not present

## 2020-12-27 NOTE — Progress Notes (Signed)
[facility] at [facility] [address] [place], [place] [address] [phone]  Date:  01/01/2021   Name:  Joel Black   DOB:  07-Mar-1955   MRN:  [id number]  PCP:  Pearline Cables, MD    Chief Complaint: Knee Pain (knee problems bilateral. ) and Shoulder Pain (X 4-5 weeks. This is the side he sleeps on. )   History of Present Illness:  Joel Black is a 65 y.o. very pleasant male patient who presents with the following:  Pt seen today with knee and shoulder pain I saw him in September - History of hypertension, cervical stenosis  He has noted bilateral knee pain for a couple of months-they may ache and feel stiff.  He has not noted particular popping, no instability or locking up.  He has not noted any swelling  He is also having right shoulder pain for a month or so He cannot abduct his arm with any weight in his hand and he has nighttime pain NKI  Not had issues with these joints in the past, no previous evaluation   Patient Active Problem List   Diagnosis Date Noted   Vertigo 07/24/2017   Atypical chest pain 09/21/2015   BRBPR (bright red blood per rectum)    Stenosis, cervical spine 02/13/2015   Shortness of breath 03/19/2014   Solitary pulmonary nodule on lung CT 02/18/2014   HTN (hypertension) 10/21/2012    Past Medical History:  Diagnosis Date   Herniated cervical disc    History of kidney stones    Hypertension    MRSA (methicillin resistant Staphylococcus aureus)    Ulcer     No past surgical history on file.  Social History   Tobacco Use   Smoking status: Former    Packs/day: 2.00    Years: 15.00    Pack years: 30.00    Types: Cigarettes    Quit date: 06/11/1986    Years since quitting: 34.5   Smokeless tobacco: Never  Substance Use Topics   Alcohol use: Yes    Alcohol/week: 21.0 standard drinks    Types: 21 Glasses of wine per week    Comment: 3-4 glasses with dinner    Drug use: No    Family History  Problem Relation Age of Onset   Cancer Father        lung   Diabetes Sister     No Known Allergies  Medication list has been reviewed and updated.  Current Outpatient Medications on File Prior to Visit  Medication Sig Dispense Refill   hydrochlorothiazide (MICROZIDE) 12.5 MG capsule TAKE 1 CAPSULE BY MOUTH EVERY DAY 90 capsule 3   losartan (COZAAR) 100 MG tablet TAKE 1 TABLET BY MOUTH EVERY DAY. 90 tablet 3   rosuvastatin (CRESTOR) 20 MG tablet Take 1 tablet (20 mg total) by mouth daily. 90 tablet 3   No current facility-administered medications on file prior to visit.    Review of Systems:  As per HPI- otherwise negative.   Physical Examination: Vitals:   01/01/21 1045  BP: (!) 142/82  Pulse: 98  Resp: 18  Temp: 97.7 F (36.5 C)  SpO2: 97%   Vitals:   01/01/21 1045  Weight: 187 lb 6.4 oz (85 kg)  Height: 6' (1.829 m)   Body mass index is 25.42 kg/m. Ideal Body Weight: Weight in (lb) to have BMI = 25: 183.9  GEN: No acute distress; alert,appropriate. PULM: Breathing comfortably  in no respiratory distress PSYCH: Normally interactive.   Right shoulder: Full range of motion but he has discomfort with extremes of abduction and abduction.  Internal rotation is normal, strength is normal.  He is tender to palpation over the anterior rotator cuff tendon insertion Knees: Crepitus is present bilaterally, but no effusion or redness.  Normal strength and range of motion, ligaments feel intact   BP Readings from Last 3 Encounters:  01/01/21 (!) 142/82  10/16/20 130/78  09/08/18 134/80    Assessment and Plan: Acute pain of right shoulder - Plan: Ambulatory referral to Orthopedic Surgery  Chronic pain of both knees - Plan: Ambulatory referral to Orthopedic Surgery  Patient seen today with concern of right shoulder pain as well as bilateral knee pain.  His wife is actually a physical therapist and has been helping him with his  shoulder, but unfortunately it is not improving.  We will plan to refer him to orthopedist for further evaluation  He would also like to be seen for his knees.  Will defer x-rays to orthopedics  Signed Abbe Amsterdam, MD

## 2021-01-01 ENCOUNTER — Ambulatory Visit (INDEPENDENT_AMBULATORY_CARE_PROVIDER_SITE_OTHER): Payer: Medicare HMO | Admitting: Family Medicine

## 2021-01-01 VITALS — BP 142/82 | HR 98 | Temp 97.7°F | Resp 18 | Ht 72.0 in | Wt 187.4 lb

## 2021-01-01 DIAGNOSIS — M25511 Pain in right shoulder: Secondary | ICD-10-CM

## 2021-01-01 DIAGNOSIS — G8929 Other chronic pain: Secondary | ICD-10-CM

## 2021-01-01 DIAGNOSIS — M25561 Pain in right knee: Secondary | ICD-10-CM

## 2021-01-01 DIAGNOSIS — M25562 Pain in left knee: Secondary | ICD-10-CM

## 2021-01-01 NOTE — Patient Instructions (Signed)
I will set you up to see orthopedics  Voltaren gel may be helpful, you can also use ibuprofen and/ or tylenol as needed

## 2021-01-07 ENCOUNTER — Encounter: Payer: Self-pay | Admitting: Orthopaedic Surgery

## 2021-01-07 ENCOUNTER — Ambulatory Visit (INDEPENDENT_AMBULATORY_CARE_PROVIDER_SITE_OTHER): Payer: Medicare HMO

## 2021-01-07 ENCOUNTER — Other Ambulatory Visit: Payer: Self-pay

## 2021-01-07 ENCOUNTER — Ambulatory Visit: Payer: Medicare HMO | Admitting: Orthopaedic Surgery

## 2021-01-07 DIAGNOSIS — G8929 Other chronic pain: Secondary | ICD-10-CM

## 2021-01-07 DIAGNOSIS — M25511 Pain in right shoulder: Secondary | ICD-10-CM

## 2021-01-07 DIAGNOSIS — M25562 Pain in left knee: Secondary | ICD-10-CM

## 2021-01-07 MED ORDER — LIDOCAINE HCL 1 % IJ SOLN
2.0000 mL | INTRAMUSCULAR | Status: AC | PRN
  Administered 2021-01-07: 13:00:00 2 mL

## 2021-01-07 MED ORDER — METHYLPREDNISOLONE ACETATE 40 MG/ML IJ SUSP
40.0000 mg | INTRAMUSCULAR | Status: AC | PRN
  Administered 2021-01-07: 13:00:00 40 mg via INTRA_ARTICULAR

## 2021-01-07 MED ORDER — BUPIVACAINE HCL 0.5 % IJ SOLN
2.0000 mL | INTRAMUSCULAR | Status: AC | PRN
Start: 2021-01-07 — End: 2021-01-07
  Administered 2021-01-07: 13:00:00 2 mL via INTRA_ARTICULAR

## 2021-01-07 NOTE — Progress Notes (Signed)
Office Visit Note   Patient: Joel Black           Date of Birth: May 20, 1955           MRN: [id number] Visit Date: 01/07/2021              Requested by: Pearline Cables, MD [address] [place],  [place] [address] PCP: Pearline Cables, MD   Assessment & Plan: Visit Diagnoses:  1. Chronic pain of left knee   2. Chronic right shoulder pain     Plan: Impression of right shoulder pain is rotator cuff tendinopathy and left knee pain and effusion possible meniscus tear.  For the right shoulder he will continue to rest and take anti-inflammatories.  Declined injection.  For the left knee we performed an aspiration and injection.  We obtained about 50 cc of inflammatory synovial fluid.  He will get back in touch with [place] if the relief is short-lived.  Likely will have to get MRI of the left knee at that time.  Follow-Up Instructions: No follow-ups on file.   Orders:  Orders Placed This Encounter  Procedures   XR KNEE 3 VIEW LEFT   XR Shoulder Right   No orders of the defined types were placed in this encounter.     Procedures: Large Joint Inj: L knee on 01/07/2021 1:06 PM Details: 22 G needle Medications: 2 mL bupivacaine 0.5 %; 2 mL lidocaine 1 %; 40 mg methylPREDNISolone acetate 40 MG/ML Outcome: tolerated well, no immediate complications Patient was prepped and draped in the usual sterile fashion.      Clinical Data: No additional findings.   Subjective: Chief Complaint  Patient presents with   Right Shoulder - Pain   Left Knee - Pain    Joel Black is a very pleasant 65 year old gentleman who comes in for evaluation of right shoulder pain and left knee pain for about 6 weeks.  Denies any specific injuries or traumas.  Feels like the right shoulder pain is actually getting better.  He feels pain in the deltoid.  Denies any radicular symptoms or neck pain.  He has some mild increased pain when raising his arm above the level of shoulder.  Left  knee pain is associated with swelling as well.  Denies any mechanical symptoms.  Voltaren gel and ibuprofen did not provide significant relief.  He does have some popping and cracking with range of motion.   Review of Systems  Constitutional: Negative.   All other systems reviewed and are negative.   Objective: Vital Signs: There were no vitals taken for this visit.  Physical Exam Vitals and nursing note reviewed.  Constitutional:      Appearance: He is well-developed.  Pulmonary:     Effort: Pulmonary effort is normal.  Abdominal:     Palpations: Abdomen is soft.  Skin:    General: Skin is warm.  Neurological:     Mental Status: He is alert and oriented to person, place, and time.  Psychiatric:        Behavior: Behavior normal.        Thought Content: Thought content normal.        Judgment: Judgment normal.    Ortho Exam  Right shoulder shows full range of motion.  Mild pain and weakness with empty can testing.  Negative cross body negative Hawkins.  Left knee shows joint effusion.  Medial joint line tenderness.  Mild patellofemoral crepitus with range of motion.  Collaterals and cruciates are stable.  Specialty Comments:  No specialty comments available.  Imaging: XR KNEE 3 VIEW LEFT  Result Date: 01/07/2021 Mild joint space narrowing consistent with osteoarthritis.  No acute abnormalities.  XR Shoulder Right  Result Date: 01/07/2021 Mild arthritic AC joint.  No acute abnormalities.    PMFS History: Patient Active Problem List   Diagnosis Date Noted   Vertigo 07/24/2017   Atypical chest pain 09/21/2015   BRBPR (bright red blood per rectum)    Stenosis, cervical spine 02/13/2015   Shortness of breath 03/19/2014   Solitary pulmonary nodule on lung CT 02/18/2014   HTN (hypertension) 10/21/2012   Past Medical History:  Diagnosis Date   Herniated cervical disc    History of kidney stones    Hypertension    MRSA (methicillin resistant Staphylococcus  aureus)    Ulcer     Family History  Problem Relation Age of Onset   Cancer Father        lung   Diabetes Sister     History reviewed. No pertinent surgical history. Social History   Occupational History   Occupation: Merchandiser, retail   Tobacco Use   Smoking status: Former    Packs/day: 2.00    Years: 15.00    Pack years: 30.00    Types: Cigarettes    Quit date: 06/11/1986    Years since quitting: 34.6   Smokeless tobacco: Never  Substance and Sexual Activity   Alcohol use: Yes    Alcohol/week: 21.0 standard drinks    Types: 21 Glasses of wine per week    Comment: 3-4 glasses with dinner   Drug use: No   Sexual activity: Yes    Partners: Female

## 2021-01-30 ENCOUNTER — Telehealth: Payer: Self-pay | Admitting: Family Medicine

## 2021-01-30 NOTE — Telephone Encounter (Signed)
Pt stated both shoulders and knees are starting to hurt due to side effects from meds -rosuvastatin (CRESTOR) 20 MG tablet [[id number]]  and would like to know if an alternate med can be prescribed. Please advise.

## 2021-01-30 NOTE — Telephone Encounter (Signed)
No other statins noted in Allergy section of chart as of yet. Please advise.

## 2021-01-31 NOTE — Telephone Encounter (Signed)
Called and instructed to try a lower dose- 10 mg every 2nd or 3rd day.  If this works can take 10 mg daily He will try it

## 2021-04-02 DIAGNOSIS — G8929 Other chronic pain: Secondary | ICD-10-CM | POA: Diagnosis not present

## 2021-04-02 DIAGNOSIS — Z87891 Personal history of nicotine dependence: Secondary | ICD-10-CM | POA: Diagnosis not present

## 2021-04-02 DIAGNOSIS — I10 Essential (primary) hypertension: Secondary | ICD-10-CM | POA: Diagnosis not present

## 2021-04-02 DIAGNOSIS — Z833 Family history of diabetes mellitus: Secondary | ICD-10-CM | POA: Diagnosis not present

## 2021-04-02 DIAGNOSIS — Z008 Encounter for other general examination: Secondary | ICD-10-CM | POA: Diagnosis not present

## 2021-04-02 DIAGNOSIS — E785 Hyperlipidemia, unspecified: Secondary | ICD-10-CM | POA: Diagnosis not present

## 2021-04-02 DIAGNOSIS — Z791 Long term (current) use of non-steroidal anti-inflammatories (NSAID): Secondary | ICD-10-CM | POA: Diagnosis not present

## 2021-04-14 ENCOUNTER — Encounter: Payer: Self-pay | Admitting: Family Medicine

## 2021-04-14 ENCOUNTER — Ambulatory Visit (INDEPENDENT_AMBULATORY_CARE_PROVIDER_SITE_OTHER): Payer: Medicare HMO | Admitting: Family Medicine

## 2021-04-14 VITALS — BP 120/78 | HR 97 | Temp 97.8°F | Ht 72.0 in | Wt 185.6 lb

## 2021-04-14 DIAGNOSIS — M2550 Pain in unspecified joint: Secondary | ICD-10-CM

## 2021-04-14 DIAGNOSIS — R52 Pain, unspecified: Secondary | ICD-10-CM | POA: Diagnosis not present

## 2021-04-14 DIAGNOSIS — M25562 Pain in left knee: Secondary | ICD-10-CM | POA: Diagnosis not present

## 2021-04-14 DIAGNOSIS — R5383 Other fatigue: Secondary | ICD-10-CM | POA: Diagnosis not present

## 2021-04-14 MED ORDER — DOXYCYCLINE HYCLATE 100 MG PO CAPS: 100.0000 mg | ORAL_CAPSULE | Freq: Two times a day (BID) | ORAL | 0 refills | Status: DC

## 2021-04-14 NOTE — Patient Instructions (Addendum)
It was good to see you today- I will be in touch with your labs asap ?If you are getting worse please contact me  ?Check temp twice a day and watch for fever ?Continue to hold statin for now ?Start on doxycycline twice a day for now  ?

## 2021-04-14 NOTE — Progress Notes (Addendum)
Nature conservation officer at [employer] ?[address], [address] ?[place], [place] [address] ?336 [phone] ?Fax [phone] ? ?Date:  04/14/2021  ? ?Name:  Joel Black   DOB:  1955-12-25   MRN:  [id number] ? ?PCP:  Pearline Cables, MD  ? ? ?Chief Complaint: Medication Problem (Pt is having myalgians and fatigue since taking statin.He has dry mouth as well./) ? ? ?History of Present Illness: ? ?Joel Black is a 66 y.o. very pleasant male patient who presents with the following: ? ?Patient seen today with concern about his medications ?Last seen by myself in December for shoulder pain- History of hypertension, cervical stenosis ? ?4-5 weeks ago he noted increase in fatigue- he was sleeping a lot more than is normal for him -and some mild muscle pains. The last 2-3 days he has felt achy in "all the joints in my body" and has noted headaches ?He was having bilateral groin pain yesterday and today he noted his knees were really stiff ?He felt as though his left knee was "going backward" and was swollen today ? ?He also notes a dry mouth and tongue blisters for 3-4 weeks  ?He has not traveled, he has not noted any recent tick bites ?He did have RMSF once in the past ?He notes his mother had lupus ? ?He noted chills this am ?He has some cough at baseline but not worse than normal  ?No vomiting but he does have some diarrhea ?Eating like normal ?No fever noted  ?No difficulty with urination  ?No chest pain or shortness of breath ?He stopped statin 3 days ago- no better so far, in fact he seems to be getting worse ? ?He is using ibuprofen which does help him some  ? ?Patient Active Problem List  ? Diagnosis Date Noted  ? Vertigo 07/24/2017  ? Atypical chest pain 09/21/2015  ? BRBPR (bright red blood per rectum)   ? Stenosis, cervical spine 02/13/2015  ? Shortness of breath 03/19/2014  ? Solitary pulmonary nodule on lung CT 02/18/2014  ? HTN (hypertension) 10/21/2012  ? ? ?Past Medical History:   ?Diagnosis Date  ? Herniated cervical disc   ? History of kidney stones   ? Hypertension   ? MRSA (methicillin resistant Staphylococcus aureus)   ? Ulcer   ? ? ?No past surgical history on file. ? ?Social History  ? ?Tobacco Use  ? Smoking status: Former  ?  Packs/day: 2.00  ?  Years: 15.00  ?  Pack years: 30.00  ?  Types: Cigarettes  ?  Quit date: 06/11/1986  ?  Years since quitting: 34.8  ? Smokeless tobacco: Never  ?Substance Use Topics  ? Alcohol use: Yes  ?  Alcohol/week: 21.0 standard drinks  ?  Types: 21 Glasses of wine per week  ?  Comment: 3-4 glasses with dinner  ? Drug use: No  ? ? ?Family History  ?Problem Relation Age of Onset  ? Cancer Father   ?     lung  ? Diabetes Sister   ? ? ?No Known Allergies ? ?Medication list has been reviewed and updated. ? ?Current Outpatient Medications on File Prior to Visit  ?Medication Sig Dispense Refill  ? hydrochlorothiazide (MICROZIDE) 12.5 MG capsule TAKE 1 CAPSULE BY MOUTH EVERY DAY 90 capsule 3  ? losartan (COZAAR) 100 MG tablet TAKE 1 TABLET BY MOUTH EVERY DAY. 90 tablet 3  ? rosuvastatin (CRESTOR) 20 MG tablet Take 1 tablet (20 mg  total) by mouth daily. (Patient taking differently: Take 20 mg by mouth daily. Taking 1/2 every other day.) 90 tablet 3  ? ?No current facility-administered medications on file prior to visit.  ? ? ?Review of Systems: ? ?As per HPI- otherwise negative. ? ? ?Physical Examination: ?Vitals:  ? 04/14/21 1539  ?BP: 120/78  ?Pulse: 97  ?Temp: 97.8 ?F (36.6 ?C)  ?SpO2: 98%  ? ?Vitals:  ? 04/14/21 1539  ?Weight: 185 lb 9.6 oz (84.2 kg)  ?Height: 6' (1.829 m)  ? ?Body mass index is 25.17 kg/m?. ?Ideal Body Weight: Weight in (lb) to have BMI = 25: 183.9 ? ?GEN: no acute distress.  Normal weight, overall looks well ?HEENT: Atraumatic, Normocephalic. Bilateral TM wnl, oropharynx normal.  PEERL,EOMI.   ?Ears and Nose: No external deformity. ?CV: RRR, No M/G/R. No JVD. No thrill. No extra heart sounds. ?PULM: CTA B, no wheezes, crackles, rhonchi. No  retractions. No resp. distress. No accessory muscle use. ?ABD: S, NT, ND, +BS. No rebound. No HSM. ?EXTR: No c/c/e ?PSYCH: Normally interactive. Conversant.  ?Left knee with small effusion.  I am able to flex and extend both knees, no redness, heat, exquisite tenderness to suggest a joint infection.  He does sometimes limp, favoring the left knee. ?Normal range of motion of both hips, no cervical, axillary, supraclavicular, or inguinal lymphadenopathy is noted ? ?Assessment and Plan: ?Arthralgia, unspecified joint - Plan: Cyclic citrul peptide antibody, IgG (QUEST), Rheumatoid Factor, Antinuclear Antib (ANA), C-reactive protein, Sedimentation rate, CANCELED: Sedimentation rate, CANCELED: C-reactive protein ? ?Complaints of total body pain - Plan: CBC, Comprehensive metabolic panel ? ?Acute pain of left knee ? ?Other fatigue - Plan: Hemoglobin A1c, TSH, doxycycline (VIBRAMYCIN) 100 MG capsule ? ?Patient seen today with several weeks of fatigue, followed by 2 to 3 days of diffuse body pains, left knee swelling and pain, dry mouth.  We started his statin several months ago and he already stopped taking it to no avail, advised I do not think this is statin related but certainly continue to hold while we do evaluation ? ?Labs are pending as above ?Started on doxycycline in case this could be a tickborne illness ?I will touch base with his orthopedist and see if he can be brought in for left knee evaluation ?Assuming that Labs do not show contraindication we will try steroids ?Signed ?Abbe AmsterdamJessica Teren Franckowiak, MD ? ?Received labs 3/28, message to patient ? ?Results for orders placed or performed in visit on 04/14/21  ?CBC  ?Result Value Ref Range  ? WBC 6.5 4.0 - 10.5 K/uL  ? RBC 4.45 4.22 - 5.81 Mil/uL  ? Platelets 215.0 150.0 - 400.0 K/uL  ? Hemoglobin 14.7 13.0 - 17.0 g/dL  ? HCT 42.6 39.0 - 52.0 %  ? MCV 95.7 78.0 - 100.0 fl  ? MCHC 34.5 30.0 - 36.0 g/dL  ? RDW 12.9 11.5 - 15.5 %  ?Comprehensive metabolic panel  ?Result Value  Ref Range  ? Sodium 136 135 - 145 mEq/L  ? Potassium 3.6 3.5 - 5.1 mEq/L  ? Chloride 101 96 - 112 mEq/L  ? CO2 24 19 - 32 mEq/L  ? Glucose, Bld 99 70 - 99 mg/dL  ? BUN 20 6 - 23 mg/dL  ? Creatinine, Ser 1.13 0.40 - 1.50 mg/dL  ? Total Bilirubin 0.7 0.2 - 1.2 mg/dL  ? Alkaline Phosphatase 61 39 - 117 U/L  ? AST 28 0 - 37 U/L  ? ALT 30 0 - 53 U/L  ? Total  Protein 7.2 6.0 - 8.3 g/dL  ? Albumin 4.3 3.5 - 5.2 g/dL  ? GFR 67.11 >60.00 mL/min  ? Calcium 9.5 8.4 - 10.5 mg/dL  ?Hemoglobin A1c  ?Result Value Ref Range  ? Hgb A1c MFr Bld 5.7 4.6 - 6.5 %  ?TSH  ?Result Value Ref Range  ? TSH 3.46 0.35 - 5.50 uIU/mL  ?Rheumatoid Factor  ?Result Value Ref Range  ? Rhuematoid fact SerPl-aCnc <14 <14 IU/mL  ?C-reactive protein  ?Result Value Ref Range  ? CRP 5.2 0.5 - 20.0 mg/dL  ?Sedimentation rate  ?Result Value Ref Range  ? Sed Rate 38 (H) 0 - 20 mm/hr  ? ?Received additional labs, another message to patient ? ?

## 2021-04-15 ENCOUNTER — Encounter: Payer: Self-pay | Admitting: Family Medicine

## 2021-04-15 LAB — C-REACTIVE PROTEIN: CRP: 5.2 mg/dL (ref 0.5–20.0)

## 2021-04-15 LAB — COMPREHENSIVE METABOLIC PANEL
ALT: 30 U/L (ref 0–53)
AST: 28 U/L (ref 0–37)
Albumin: 4.3 g/dL (ref 3.5–5.2)
Alkaline Phosphatase: 61 U/L (ref 39–117)
BUN: 20 mg/dL (ref 6–23)
CO2: 24 mEq/L (ref 19–32)
Calcium: 9.5 mg/dL (ref 8.4–10.5)
Chloride: 101 mEq/L (ref 96–112)
Creatinine, Ser: 1.13 mg/dL (ref 0.40–1.50)
GFR: 67.11 mL/min (ref >60.00)
Glucose, Bld: 99 mg/dL (ref 70–99)
Potassium: 3.6 mEq/L (ref 3.5–5.1)
Sodium: 136 mEq/L (ref 135–145)
Total Bilirubin: 0.7 mg/dL (ref 0.2–1.2)
Total Protein: 7.2 g/dL (ref 6.0–8.3)

## 2021-04-15 LAB — CBC
HCT: 42.6 % (ref 39.0–52.0)
Hemoglobin: 14.7 g/dL (ref 13.0–17.0)
MCHC: 34.5 g/dL (ref 30.0–36.0)
MCV: 95.7 fl (ref 78.0–100.0)
Platelets: 215 10*3/uL (ref 150.0–400.0)
RBC: 4.45 Mil/uL (ref 4.22–5.81)
RDW: 12.9 % (ref 11.5–15.5)
WBC: 6.5 10*3/uL (ref 4.0–10.5)

## 2021-04-15 LAB — SEDIMENTATION RATE: Sed Rate: 38 mm/hr — ABNORMAL HIGH (ref 0–20)

## 2021-04-15 LAB — TSH: TSH: 3.46 u[IU]/mL (ref 0.35–5.50)

## 2021-04-15 LAB — HEMOGLOBIN A1C: Hgb A1c MFr Bld: 5.7 % (ref 4.6–6.5)

## 2021-04-15 MED ORDER — PREDNISONE 20 MG PO TABS: ORAL_TABLET | ORAL | 0 refills | Status: DC

## 2021-04-15 NOTE — Addendum Note (Signed)
Addended by: Lamar Blinks C on: 04/15/2021 03:44 PM ? ? Modules accepted: Orders ? ?

## 2021-04-16 DIAGNOSIS — M9903 Segmental and somatic dysfunction of lumbar region: Secondary | ICD-10-CM | POA: Diagnosis not present

## 2021-04-16 DIAGNOSIS — M9902 Segmental and somatic dysfunction of thoracic region: Secondary | ICD-10-CM | POA: Diagnosis not present

## 2021-04-16 DIAGNOSIS — M5032 Other cervical disc degeneration, mid-cervical region, unspecified level: Secondary | ICD-10-CM | POA: Diagnosis not present

## 2021-04-16 DIAGNOSIS — M9905 Segmental and somatic dysfunction of pelvic region: Secondary | ICD-10-CM | POA: Diagnosis not present

## 2021-04-16 DIAGNOSIS — M5136 Other intervertebral disc degeneration, lumbar region: Secondary | ICD-10-CM | POA: Diagnosis not present

## 2021-04-16 DIAGNOSIS — M5431 Sciatica, right side: Secondary | ICD-10-CM | POA: Diagnosis not present

## 2021-04-16 LAB — RHEUMATOID FACTOR: Rheumatoid fact SerPl-aCnc: <14 IU/mL (ref <14)

## 2021-04-16 LAB — ANA: Anti Nuclear Antibody (ANA): NEGATIVE

## 2021-04-16 LAB — CYCLIC CITRUL PEPTIDE ANTIBODY, IGG: Cyclic Citrullin Peptide Ab: <16 UNITS

## 2021-04-17 ENCOUNTER — Encounter: Payer: Self-pay | Admitting: Family Medicine

## 2021-04-22 ENCOUNTER — Ambulatory Visit (INDEPENDENT_AMBULATORY_CARE_PROVIDER_SITE_OTHER): Payer: Medicare HMO | Admitting: Orthopaedic Surgery

## 2021-04-22 ENCOUNTER — Encounter: Payer: Self-pay | Admitting: Orthopaedic Surgery

## 2021-04-22 DIAGNOSIS — M25569 Pain in unspecified knee: Secondary | ICD-10-CM | POA: Diagnosis not present

## 2021-04-22 NOTE — Progress Notes (Signed)
? ?Office Visit Note ?  ?Patient: Joel Black           ?Date of Birth: 12/11/1955           ?MRN: [id number] ?Visit Date: 04/22/2021 ?             ?Requested by: Pearline Cables, MD ?[address] ?[address] ?[place],  [place] [address] ?PCP: Pearline Cables, MD ? ? ?Assessment & Plan: ?Visit Diagnoses:  ?1. Knee pain, unspecified chronicity, unspecified laterality   ? ? ?Plan: Impression is resolved bilateral knee pain.  Based on the patient's clinical history it sounds as though this may be from a tickborne illness rather than an orthopedic problem.  Should his symptoms return he may follow-up with [place] for recheck. ? ?Follow-Up Instructions: Return if symptoms worsen or fail to improve.  ? ?Orders:  ?No orders of the defined types were placed in this encounter. ? ?No orders of the defined types were placed in this encounter. ? ? ? ? Procedures: ?No procedures performed ? ? ?Clinical Data: ?No additional findings. ? ? ?Subjective: ?Chief Complaint  ?Patient presents with  ? Right Knee - Pain  ? Left Knee - Pain  ? ? ?HPI patient is a pleasant 66 year old gentleman who comes in today with concerns his knees.  He began having multiple joint pain about 4 to 6 weeks ago.  He notes that back in September he was started on a statin medication.  His primary care provider decreased the dose about 4 weeks ago without any change.  He notes that he has had intermittent swelling and pain to both knees and was actually started on prednisone and doxycycline about a week ago.  Soon after, his symptoms resolved.  He notes that about 7 to 8 years ago he was diagnosed with [facility] spotted fever and that over the past year he has pulled off 5-6 ticks.  Inflammatory labs were drawn by his primary care provider last month which showed increased sedimentation rate but otherwise unremarkable. ? ?Review of Systems as detailed in HPI.  All others reviewed and are negative. ? ? ?Objective: ?Vital Signs: There were no  vitals taken for this visit. ? ?Physical Exam well-developed well-nourished gentleman no acute distress.  Alert and oriented x3. ? ?Ortho Exam bilateral knee exam unremarkable ? ?Specialty Comments:  ?No specialty comments available. ? ?Imaging: ?No new imaging ? ? ?PMFS History: ?Patient Active Problem List  ? Diagnosis Date Noted  ? Vertigo 07/24/2017  ? Atypical chest pain 09/21/2015  ? BRBPR (bright red blood per rectum)   ? Stenosis, cervical spine 02/13/2015  ? Shortness of breath 03/19/2014  ? Solitary pulmonary nodule on lung CT 02/18/2014  ? HTN (hypertension) 10/21/2012  ? ?Past Medical History:  ?Diagnosis Date  ? Herniated cervical disc   ? History of kidney stones   ? Hypertension   ? MRSA (methicillin resistant Staphylococcus aureus)   ? Ulcer   ?  ?Family History  ?Problem Relation Age of Onset  ? Cancer Father   ?     lung  ? Diabetes Sister   ?  ?History reviewed. No pertinent surgical history. ?Social History  ? ?Occupational History  ? Occupation: artist/sculptor   ?Tobacco Use  ? Smoking status: Former  ?  Packs/day: 2.00  ?  Years: 15.00  ?  Pack years: 30.00  ?  Types: Cigarettes  ?  Quit date: 06/11/1986  ?  Years since quitting: 34.8  ?  Smokeless tobacco: Never  ?Substance and Sexual Activity  ? Alcohol use: Yes  ?  Alcohol/week: 21.0 standard drinks  ?  Types: 21 Glasses of wine per week  ?  Comment: 3-4 glasses with dinner  ? Drug use: No  ? Sexual activity: Yes  ?  Partners: Female  ? ? ? ? ? ? ?

## 2021-05-07 DIAGNOSIS — M5431 Sciatica, right side: Secondary | ICD-10-CM | POA: Diagnosis not present

## 2021-05-07 DIAGNOSIS — M9902 Segmental and somatic dysfunction of thoracic region: Secondary | ICD-10-CM | POA: Diagnosis not present

## 2021-05-07 DIAGNOSIS — M5032 Other cervical disc degeneration, mid-cervical region, unspecified level: Secondary | ICD-10-CM | POA: Diagnosis not present

## 2021-05-07 DIAGNOSIS — M5136 Other intervertebral disc degeneration, lumbar region: Secondary | ICD-10-CM | POA: Diagnosis not present

## 2021-05-07 DIAGNOSIS — M9903 Segmental and somatic dysfunction of lumbar region: Secondary | ICD-10-CM | POA: Diagnosis not present

## 2021-05-07 DIAGNOSIS — M9905 Segmental and somatic dysfunction of pelvic region: Secondary | ICD-10-CM | POA: Diagnosis not present

## 2021-05-21 DIAGNOSIS — M5431 Sciatica, right side: Secondary | ICD-10-CM | POA: Diagnosis not present

## 2021-05-21 DIAGNOSIS — M9902 Segmental and somatic dysfunction of thoracic region: Secondary | ICD-10-CM | POA: Diagnosis not present

## 2021-05-21 DIAGNOSIS — M5136 Other intervertebral disc degeneration, lumbar region: Secondary | ICD-10-CM | POA: Diagnosis not present

## 2021-05-21 DIAGNOSIS — M5032 Other cervical disc degeneration, mid-cervical region, unspecified level: Secondary | ICD-10-CM | POA: Diagnosis not present

## 2021-05-21 DIAGNOSIS — M9905 Segmental and somatic dysfunction of pelvic region: Secondary | ICD-10-CM | POA: Diagnosis not present

## 2021-05-21 DIAGNOSIS — M9903 Segmental and somatic dysfunction of lumbar region: Secondary | ICD-10-CM | POA: Diagnosis not present

## 2021-05-28 DIAGNOSIS — M9905 Segmental and somatic dysfunction of pelvic region: Secondary | ICD-10-CM | POA: Diagnosis not present

## 2021-05-28 DIAGNOSIS — M5136 Other intervertebral disc degeneration, lumbar region: Secondary | ICD-10-CM | POA: Diagnosis not present

## 2021-05-28 DIAGNOSIS — M9903 Segmental and somatic dysfunction of lumbar region: Secondary | ICD-10-CM | POA: Diagnosis not present

## 2021-05-28 DIAGNOSIS — M5032 Other cervical disc degeneration, mid-cervical region, unspecified level: Secondary | ICD-10-CM | POA: Diagnosis not present

## 2021-05-28 DIAGNOSIS — M9902 Segmental and somatic dysfunction of thoracic region: Secondary | ICD-10-CM | POA: Diagnosis not present

## 2021-05-28 DIAGNOSIS — M5431 Sciatica, right side: Secondary | ICD-10-CM | POA: Diagnosis not present

## 2021-06-11 DIAGNOSIS — M9902 Segmental and somatic dysfunction of thoracic region: Secondary | ICD-10-CM | POA: Diagnosis not present

## 2021-06-11 DIAGNOSIS — M5431 Sciatica, right side: Secondary | ICD-10-CM | POA: Diagnosis not present

## 2021-06-11 DIAGNOSIS — M9905 Segmental and somatic dysfunction of pelvic region: Secondary | ICD-10-CM | POA: Diagnosis not present

## 2021-06-11 DIAGNOSIS — M5136 Other intervertebral disc degeneration, lumbar region: Secondary | ICD-10-CM | POA: Diagnosis not present

## 2021-06-11 DIAGNOSIS — M9903 Segmental and somatic dysfunction of lumbar region: Secondary | ICD-10-CM | POA: Diagnosis not present

## 2021-06-11 DIAGNOSIS — M5032 Other cervical disc degeneration, mid-cervical region, unspecified level: Secondary | ICD-10-CM | POA: Diagnosis not present

## 2021-08-25 ENCOUNTER — Telehealth: Payer: Self-pay

## 2021-08-25 ENCOUNTER — Ambulatory Visit (INDEPENDENT_AMBULATORY_CARE_PROVIDER_SITE_OTHER): Payer: Medicare HMO

## 2021-08-25 VITALS — Ht 72.0 in | Wt 185.0 lb

## 2021-08-25 DIAGNOSIS — Z0000 Encounter for general adult medical examination without abnormal findings: Secondary | ICD-10-CM

## 2021-08-25 NOTE — Progress Notes (Signed)
Subjective:   Joel Black is a 66 y.o. male who presents for an Initial Medicare Annual Wellness Visit.  I connected with Joel Black today by telephone and verified that I am speaking with the correct person using two identifiers. Location patient: home Location provider: work Persons participating in the virtual visit: patient, Marine scientist.    I discussed the limitations, risks, security and privacy concerns of performing an evaluation and management service by telephone and the availability of in person appointments. I also discussed with the patient that there may be a patient responsible charge related to this service. The patient expressed understanding and verbally consented to this telephonic visit.    Interactive audio and video telecommunications were attempted between this provider and patient, however failed, due to patient having technical difficulties OR patient did not have access to video capability.  We continued and completed visit with audio only.  Some vital signs may be absent or patient reported.   Time Spent with patient on telephone encounter: 25 minutes   Review of Systems     Cardiac Risk Factors include: advanced age (>61mn, >>90women);male gender;dyslipidemia;hypertension;sedentary lifestyle     Objective:    Today's Vitals   08/25/21 1311  Weight: 185 lb (83.9 kg)  Height: 6' (1.829 m)  PainSc: 1    Body mass index is 25.09 kg/m.     08/25/2021    1:19 PM 02/03/2016    8:45 PM 09/21/2015    9:07 AM  Advanced Directives  Does Patient Have a Medical Advance Directive? No No No  Would patient like information on creating a medical advance directive?  No - Patient declined No - patient declined information    Current Medications (verified) Outpatient Encounter Medications as of 08/25/2021  Medication Sig   hydrochlorothiazide (MICROZIDE) 12.5 MG capsule TAKE 1 CAPSULE BY MOUTH EVERY DAY   losartan (COZAAR) 100 MG tablet TAKE 1 TABLET BY MOUTH EVERY  DAY.   rosuvastatin (CRESTOR) 20 MG tablet Take 1 tablet (20 mg total) by mouth daily. (Patient not taking: Reported on 08/25/2021)   [DISCONTINUED] doxycycline (VIBRAMYCIN) 100 MG capsule Take 1 capsule (100 mg total) by mouth 2 (two) times daily.   [DISCONTINUED] predniSONE (DELTASONE) 20 MG tablet Take 40 mg by mouth once daily for 5 days   No facility-administered encounter medications on file as of 08/25/2021.    Allergies (verified) Patient has no known allergies.   History: Past Medical History:  Diagnosis Date   Herniated cervical disc    History of kidney stones    Hypertension    MRSA (methicillin resistant Staphylococcus aureus)    Ulcer    History reviewed. No pertinent surgical history. Family History  Problem Relation Age of Onset   Cancer Father        lung   Diabetes Sister    Social History   Socioeconomic History   Marital status: Married    Spouse name: Not on file   Number of children: Not on file   Years of education: Not on file   Highest education level: Not on file  Occupational History   Occupation: artist/sculptor   Tobacco Use   Smoking status: Former    Packs/day: 2.00    Years: 15.00    Total pack years: 30.00    Types: Cigarettes    Quit date: 06/11/1986    Years since quitting: 35.2   Smokeless tobacco: Never  Substance and Sexual Activity   Alcohol use: Yes    Alcohol/week:  21.0 standard drinks of alcohol    Types: 21 Glasses of wine per week    Comment: 3-4 glasses with dinner   Drug use: No   Sexual activity: Yes    Partners: Female  Other Topics Concern   Not on file  Social History Narrative   Not on file   Social Determinants of Health   Financial Resource Strain: Medium Risk (08/25/2021)   Overall Financial Resource Strain (CARDIA)    Difficulty of Paying Living Expenses: Somewhat hard  Food Insecurity: Food Insecurity Present (08/25/2021)   Hunger Vital Sign    Worried About Running Out of Food in the Last Year: Sometimes  true    Ran Out of Food in the Last Year: Never true  Transportation Needs: No Transportation Needs (08/25/2021)   PRAPARE - Hydrologist (Medical): No    Lack of Transportation (Non-Medical): No  Physical Activity: Inactive (08/25/2021)   Exercise Vital Sign    Days of Exercise per Week: 0 days    Minutes of Exercise per Session: 0 min  Stress: Stress Concern Present (08/25/2021)   [place]    Feeling of Stress : To some extent  Social Connections: Moderately Integrated (08/25/2021)   Social Connection and Isolation Panel [NHANES]    Frequency of Communication with Friends and Family: Twice a week    Frequency of Social Gatherings with Friends and Family: Twice a week    Attends Religious Services: More than 4 times per year    Active Member of [employer] or Organizations: No    Attends Music therapist: Never    Marital Status: Married    Tobacco Counseling Counseling given: Not Answered   Clinical Intake:  Pre-visit preparation completed: Yes  Pain : 0-10 Pain Score: 1  Pain Type: Chronic pain Pain Onset: More than a month ago Pain Frequency: Intermittent     BMI - recorded: 25.09 Nutritional Status: BMI 25 -29 Overweight Nutritional Risks: None Diabetes: No  How often do you need to have someone help you when you read instructions, pamphlets, or other written materials from your doctor or pharmacy?: 1 - Never  Diabetic?No  Interpreter Needed?: No  Information entered by :: Caroleen Hamman LPN   Activities of Daily Living    08/25/2021    1:23 PM 01/01/2021   10:49 AM  In your present state of health, do you have any difficulty performing the following activities:  Hearing? 0 0  Vision? 0 0  Difficulty concentrating or making decisions? 0 0  Walking or climbing stairs? 0 0  Dressing or bathing? 0 0  Doing errands, shopping? 0 0  Preparing Food and eating ? N    Using the Toilet? N   In the past six months, have you accidently leaked urine? N   Do you have problems with loss of bowel control? N   Managing your Medications? N   Managing your Finances? N   Housekeeping or managing your Housekeeping? N     Patient Care Team: Copland, Gay Filler, MD as PCP - General (Family Medicine)  Indicate any recent Medical Services you may have received from other than [facility] providers in the past year (date may be approximate).     Assessment:   This is a routine wellness examination for Ada.  Hearing/Vision screen Vision Screening - Comments:: Last eye exam-2 years ago  Dietary issues and exercise activities discussed: Current Exercise Habits: The  patient does not participate in regular exercise at present, Exercise limited by: None identified   Goals Addressed             This Visit's Progress    Patient Stated       Would like to increase energy level       Depression Screen    08/25/2021    1:21 PM 10/16/2020    4:14 PM 07/24/2017   10:05 AM 12/26/2015    9:25 AM 02/13/2015    9:03 AM  PHQ 2/9 Scores  PHQ - 2 Score 1 0 0 0 0    Fall Risk    08/25/2021    1:20 PM 10/16/2020    4:15 PM 07/24/2017   10:05 AM 12/26/2015    9:25 AM 02/13/2015    9:03 AM  Fall Risk   Falls in the past year? 0 0 No No No  Number falls in past yr: 0 0     Injury with Fall? 0 0     Follow up Falls prevention discussed        [place]:  Any stairs in or around the home? Yes  If so, are there any without handrails? No  Home free of loose throw rugs in walkways, pet beds, electrical cords, etc? Yes  Adequate lighting in your home to reduce risk of falls? Yes   ASSISTIVE DEVICES UTILIZED TO PREVENT FALLS:  Life alert? No  Use of a cane, walker or w/c? No  Grab bars in the bathroom? No  Shower chair or bench in shower? No  Elevated toilet seat or a handicapped toilet? No   TIMED UP AND GO:  Was the test performed?  No .phone visit    Cognitive Function:Normal cognitive status assessed by this Nurse Health Advisor. No abnormalities found.          Immunizations Immunization History  Administered Date(s) Administered   Tdap 02/13/2015    TDAP status: Up to date  Flu Vaccine status: Declined, Education has been provided regarding the importance of this vaccine but patient still declined. Advised may receive this vaccine at local pharmacy or Health Dept. Aware to provide a copy of the vaccination record if obtained from local pharmacy or Health Dept. Verbalized acceptance and understanding.  Pneumococcal vaccine status: Declined,  Education has been provided regarding the importance of this vaccine but patient still declined. Advised may receive this vaccine at local pharmacy or Health Dept. Aware to provide a copy of the vaccination record if obtained from local pharmacy or Health Dept. Verbalized acceptance and understanding.   Covid-19 vaccine status: Declined, Education has been provided regarding the importance of this vaccine but patient still declined. Advised may receive this vaccine at local pharmacy or Health Dept.or vaccine clinic. Aware to provide a copy of the vaccination record if obtained from local pharmacy or Health Dept. Verbalized acceptance and understanding.  Qualifies for Shingles Vaccine? Yes   Zostavax completed No   Shingrix Completed?: No.    Education has been provided regarding the importance of this vaccine. Patient has been advised to call insurance company to determine out of pocket expense if they have not yet received this vaccine. Advised may also receive vaccine at local pharmacy or Health Dept. Verbalized acceptance and understanding.  Screening Tests Health Maintenance  Topic Date Due   COVID-19 Vaccine (1) Never done   COLONOSCOPY (Pts 45-70yr Insurance coverage will need to be confirmed)  Never done  Zoster Vaccines- Shingrix (1 of 2) Never done   Pneumonia  Vaccine 63+ Years old (1 - PCV) Never done   INFLUENZA VACCINE  08/19/2021   TETANUS/TDAP  02/12/2025   Hepatitis C Screening  Completed   HPV VACCINES  Aged Out    Health Maintenance  Health Maintenance Due  Topic Date Due   COVID-19 Vaccine (1) Never done   COLONOSCOPY (Pts 45-40yr Insurance coverage will need to be confirmed)  Never done   Zoster Vaccines- Shingrix (1 of 2) Never done   Pneumonia Vaccine 68 Years old (1 - PCV) Never done   INFLUENZA VACCINE  08/19/2021    Colorectal cancer screening: Due-Patient states he has a kit he received from his insurance company. He plans to complete & send in.  Lung Cancer Screening: (Low Dose CT Chest recommended if Age 68-80years, 30 pack-year currently smoking OR have quit w/in 15years.) does not qualify.     Additional Screening:  Hepatitis C Screening: Completed 02/13/2015  Vision Screening: Recommended annual ophthalmology exams for early detection of glaucoma and other disorders of the eye. Is the patient up to date with their annual eye exam?  No  Who is the provider or what is the name of the office in which the patient attends annual eye exams? unknown   Dental Screening: Recommended annual dental exams for proper oral hygiene  Community Resource Referral / Chronic Care Management: CRR required this visit?  Yes for financial assistance  CCM required this visit?  No      Plan:     I have personally reviewed and noted the following in the patient's chart:   Medical and social history Use of alcohol, tobacco or illicit drugs  Current medications and supplements including opioid prescriptions. Patient is not currently taking opioid prescriptions. Functional ability and status Nutritional status Physical activity Advanced directives List of other physicians Hospitalizations, surgeries, and ER visits in previous 12 months Vitals Screenings to include cognitive, depression, and falls Referrals and  appointments  In addition, I have reviewed and discussed with patient certain preventive protocols, quality metrics, and best practice recommendations. A written personalized care plan for preventive services as well as general preventive health recommendations were provided to patient.   Due to this being a telephonic visit, the after visit summary with patients personalized plan was offered to patient via mail or my-chart. Patient would like to access on my-chart.   MMarta Antu LPN   [id number] Nurse Health Advisor  Nurse Notes: Patient c/o fatigue & coughing up mucus in the mornings. Appt made with PCP for 08/27/2021.

## 2021-08-25 NOTE — Telephone Encounter (Signed)
Patient states he was referred to a hand specialist a while back & was unable to go at that time & would like to call them & reschedule but he cannot remember the name of the doctor. Also, he complains of fatigue & a productive cough in the mornings. Appt made for 08/27/2021.

## 2021-08-25 NOTE — Patient Instructions (Signed)
[name] , Thank you for taking time to complete your Medicare Wellness Visit. I appreciate your ongoing commitment to your health goals. Please review the following plan we discussed and let me know if I can assist you in the future.   Screening recommendations/referrals: Colonoscopy: Due-Per our conversation, you have a kit from your insurance company to complete & send in. Recommended yearly ophthalmology/optometry visit for glaucoma screening and checkup Recommended yearly dental visit for hygiene and checkup  Vaccinations: Influenza vaccine: Declined Pneumococcal vaccine: Declined Tdap vaccine: Up to date Shingles vaccine: Declined   Covid-19: Declined  Advanced directives: May pick up information at your next visit.  Conditions/risks identified: See problem list  Next appointment: Follow up in one year for your annual wellness visit.   Preventive Care 68 Years and Older, Male Preventive care refers to lifestyle choices and visits with your health care provider that can promote health and wellness. What does preventive care include? A yearly physical exam. This is also called an annual well check. Dental exams once or twice a year. Routine eye exams. Ask your health care provider how often you should have your eyes checked. Personal lifestyle choices, including: Daily care of your teeth and gums. Regular physical activity. Eating a healthy diet. Avoiding tobacco and drug use. Limiting alcohol use. Practicing safe sex. Taking low doses of aspirin every day. Taking vitamin and mineral supplements as recommended by your health care provider. What happens during an annual well check? The services and screenings done by your health care provider during your annual well check will depend on your age, overall health, lifestyle risk factors, and family history of disease. Counseling  Your health care provider may ask you questions about your: Alcohol use. Tobacco use. Drug  use. Emotional well-being. Home and relationship well-being. Sexual activity. Eating habits. History of falls. Memory and ability to understand (cognition). Work and work Statistician. Screening  You may have the following tests or measurements: Height, weight, and BMI. Blood pressure. Lipid and cholesterol levels. These may be checked every 5 years, or more frequently if you are over 40 years old. Skin check. Lung cancer screening. You may have this screening every year starting at age 28 if you have a 30-pack-year history of smoking and currently smoke or have quit within the past 15 years. Fecal occult blood test (FOBT) of the stool. You may have this test every year starting at age 61. Flexible sigmoidoscopy or colonoscopy. You may have a sigmoidoscopy every 5 years or a colonoscopy every 10 years starting at age 41. Prostate cancer screening. Recommendations will vary depending on your family history and other risks. Hepatitis C blood test. Hepatitis B blood test. Sexually transmitted disease (STD) testing. Diabetes screening. This is done by checking your blood sugar (glucose) after you have not eaten for a while (fasting). You may have this done every 1-3 years. Abdominal aortic aneurysm (AAA) screening. You may need this if you are a current or former smoker. Osteoporosis. You may be screened starting at age 20 if you are at high risk. Talk with your health care provider about your test results, treatment options, and if necessary, the need for more tests. Vaccines  Your health care provider may recommend certain vaccines, such as: Influenza vaccine. This is recommended every year. Tetanus, diphtheria, and acellular pertussis (Tdap, Td) vaccine. You may need a Td booster every 10 years. Zoster vaccine. You may need this after age 52. Pneumococcal 13-valent conjugate (PCV13) vaccine. One dose is recommended after  age 8. Pneumococcal polysaccharide (PPSV23) vaccine. One dose is  recommended after age 29. Talk to your health care provider about which screenings and vaccines you need and how often you need them. This information is not intended to replace advice given to you by your health care provider. Make sure you discuss any questions you have with your health care provider. Document Released: 02/01/2015 Document Revised: 09/25/2015 Document Reviewed: 11/06/2014 Elsevier Interactive Patient Education  2017 [place] Prevention in the Home Falls can cause injuries. They can happen to people of all ages. There are many things you can do to make your home safe and to help prevent falls. What can I do on the outside of my home? Regularly fix the edges of walkways and driveways and fix any cracks. Remove anything that might make you trip as you walk through a door, such as a raised step or threshold. Trim any bushes or trees on the path to your home. Use bright outdoor lighting. Clear any walking paths of anything that might make someone trip, such as rocks or tools. Regularly check to see if handrails are loose or broken. Make sure that both sides of any steps have handrails. Any raised decks and porches should have guardrails on the edges. Have any leaves, snow, or ice cleared regularly. Use sand or salt on walking paths during winter. Clean up any spills in your garage right away. This includes oil or grease spills. What can I do in the bathroom? Use night lights. Install grab bars by the toilet and in the tub and shower. Do not use towel bars as grab bars. Use non-skid mats or decals in the tub or shower. If you need to sit down in the shower, use a plastic, non-slip stool. Keep the floor dry. Clean up any water that spills on the floor as soon as it happens. Remove soap buildup in the tub or shower regularly. Attach bath mats securely with double-sided non-slip rug tape. Do not have throw rugs and other things on the floor that can make you  trip. What can I do in the bedroom? Use night lights. Make sure that you have a light by your bed that is easy to reach. Do not use any sheets or blankets that are too big for your bed. They should not hang down onto the floor. Have a firm chair that has side arms. You can use this for support while you get dressed. Do not have throw rugs and other things on the floor that can make you trip. What can I do in the kitchen? Clean up any spills right away. Avoid walking on wet floors. Keep items that you use a lot in easy-to-reach places. If you need to reach something above you, use a strong step stool that has a grab bar. Keep electrical cords out of the way. Do not use floor polish or wax that makes floors slippery. If you must use wax, use non-skid floor wax. Do not have throw rugs and other things on the floor that can make you trip. What can I do with my stairs? Do not leave any items on the stairs. Make sure that there are handrails on both sides of the stairs and use them. Fix handrails that are broken or loose. Make sure that handrails are as long as the stairways. Check any carpeting to make sure that it is firmly attached to the stairs. Fix any carpet that is loose or worn. Avoid having throw rugs  at the top or bottom of the stairs. If you do have throw rugs, attach them to the floor with carpet tape. Make sure that you have a light switch at the top of the stairs and the bottom of the stairs. If you do not have them, ask someone to add them for you. What else can I do to help prevent falls? Wear shoes that: Do not have high heels. Have rubber bottoms. Are comfortable and fit you well. Are closed at the toe. Do not wear sandals. If you use a stepladder: Make sure that it is fully opened. Do not climb a closed stepladder. Make sure that both sides of the stepladder are locked into place. Ask someone to hold it for you, if possible. Clearly mark and make sure that you can  see: Any grab bars or handrails. First and last steps. Where the edge of each step is. Use tools that help you move around (mobility aids) if they are needed. These include: Canes. Walkers. Scooters. Crutches. Turn on the lights when you go into a dark area. Replace any light bulbs as soon as they burn out. Set up your furniture so you have a clear path. Avoid moving your furniture around. If any of your floors are uneven, fix them. If there are any pets around you, be aware of where they are. Review your medicines with your doctor. Some medicines can make you feel dizzy. This can increase your chance of falling. Ask your doctor what other things that you can do to help prevent falls. This information is not intended to replace advice given to you by your health care provider. Make sure you discuss any questions you have with your health care provider. Document Released: 11/01/2008 Document Revised: 06/13/2015 Document Reviewed: 02/09/2014 Elsevier Interactive Patient Education  2017 [employer].

## 2021-08-26 ENCOUNTER — Telehealth: Payer: Self-pay | Admitting: *Deleted

## 2021-08-26 NOTE — Progress Notes (Unsigned)
Joel Black [address], [address] [place], [place] [address] [phone]  Date:  08/27/2021   Name:  Joel Black   DOB:  [birth year]   MRN:  [id number]  PCP:  Pearline Cables, MD    Chief Complaint: No chief complaint on file.   History of Present Illness:  Joel Black is a 66 y.o. very pleasant male patient who presents with the following:  Patient seen today with concern of cough and fatigue Most recent visit with myself was in March History of hypertension, cervical stenosis  Of note, in March he also had concern of fatigue and joint/muscle pain and stiffness We obtained lab work which was generally nonrevealing, sed rate mildly elevated at 38 We had him see orthopedics, Dr. Roda Shutters for his knee pain-because his symptoms got better with steroids and Doxy this was actually thought to be a possible tickborne illness Patient Active Problem List   Diagnosis Date Noted   Vertigo 07/24/2017   Atypical chest pain 09/21/2015   BRBPR (bright red blood per rectum)    Stenosis, cervical spine 02/13/2015   Shortness of breath 03/19/2014   Solitary pulmonary nodule on lung CT 02/18/2014   HTN (hypertension) 10/21/2012    Past Medical History:  Diagnosis Date   Herniated cervical disc    History of kidney stones    Hypertension    MRSA (methicillin resistant Staphylococcus aureus)    Ulcer     No past surgical history on file.  Social History   Tobacco Use   Smoking status: Former    Packs/day: 2.00    Years: 15.00    Total pack years: 30.00    Types: Cigarettes    Quit date: 06/11/1986    Years since quitting: 35.2   Smokeless tobacco: Never  Substance Use Topics   Alcohol use: Yes    Alcohol/week: 21.0 standard drinks of alcohol    Types: 21 Glasses of wine per week    Comment: 3-4 glasses with dinner   Drug use: No    Family History  Problem Relation Age of Onset   Cancer Father         lung   Diabetes Sister     No Known Allergies  Medication list has been reviewed and updated.  Current Outpatient Medications on File Prior to Visit  Medication Sig Dispense Refill   hydrochlorothiazide (MICROZIDE) 12.5 MG capsule TAKE 1 CAPSULE BY MOUTH EVERY DAY 90 capsule 3   losartan (COZAAR) 100 MG tablet TAKE 1 TABLET BY MOUTH EVERY DAY. 90 tablet 3   rosuvastatin (CRESTOR) 20 MG tablet Take 1 tablet (20 mg total) by mouth daily. (Patient not taking: Reported on 08/25/2021) 90 tablet 3   No current facility-administered medications on file prior to visit.    Review of Systems:  As per HPI- otherwise negative.   Physical Examination: There were no vitals filed for this visit. There were no vitals filed for this visit. There is no height or weight on file to calculate BMI. Ideal Body Weight:    GEN: no acute distress. HEENT: Atraumatic, Normocephalic.  Ears and Nose: No external deformity. CV: RRR, No M/G/R. No JVD. No thrill. No extra heart sounds. PULM: CTA B, no wheezes, crackles, rhonchi. No retractions. No resp. distress. No accessory muscle use. ABD: S, NT, ND, +BS. No rebound. No HSM. EXTR: No c/c/e PSYCH: Normally interactive. Conversant.    Assessment and Plan: ***  Signed Lamar Blinks, MD

## 2021-08-26 NOTE — Telephone Encounter (Signed)
   Telephone encounter was:  Unsuccessful.  08/26/2021 Name: Joel Black MRN: [id number] DOB: [birth year]  Unsuccessful outbound call made today to assist with:  Food Insecurity  Outreach Attempt:  1st Attempt Phone stopped ringing no ability to leave a voicemail Electronics engineer -[facility], Population Health [phone] [address] [place] , [place] [address] Email : [name]

## 2021-08-27 ENCOUNTER — Encounter: Payer: Self-pay | Admitting: Family Medicine

## 2021-08-27 ENCOUNTER — Ambulatory Visit (INDEPENDENT_AMBULATORY_CARE_PROVIDER_SITE_OTHER): Payer: Medicare HMO | Admitting: Family Medicine

## 2021-08-27 ENCOUNTER — Ambulatory Visit (HOSPITAL_BASED_OUTPATIENT_CLINIC_OR_DEPARTMENT_OTHER)
Admission: RE | Admit: 2021-08-27 | Discharge: 2021-08-27 | Disposition: A | Discharge status: Home / Self Care | Payer: Medicare HMO | Source: Ambulatory Visit | Attending: Family Medicine | Admitting: Family Medicine

## 2021-08-27 VITALS — BP 122/80 | HR 84 | Temp 97.6°F | Resp 18 | Ht 72.0 in | Wt 190.2 lb

## 2021-08-27 DIAGNOSIS — E559 Vitamin D deficiency, unspecified: Secondary | ICD-10-CM | POA: Diagnosis not present

## 2021-08-27 DIAGNOSIS — R0602 Shortness of breath: Secondary | ICD-10-CM | POA: Insufficient documentation

## 2021-08-27 DIAGNOSIS — R400 Somnolence: Secondary | ICD-10-CM | POA: Diagnosis not present

## 2021-08-27 DIAGNOSIS — I10 Essential (primary) hypertension: Secondary | ICD-10-CM

## 2021-08-27 DIAGNOSIS — R5383 Other fatigue: Secondary | ICD-10-CM | POA: Diagnosis not present

## 2021-08-27 DIAGNOSIS — E538 Deficiency of other specified B group vitamins: Secondary | ICD-10-CM | POA: Diagnosis not present

## 2021-08-27 MED ORDER — DOXYCYCLINE HYCLATE 100 MG PO CAPS: 100.0000 mg | ORAL_CAPSULE | Freq: Two times a day (BID) | ORAL | 0 refills | Status: DC

## 2021-08-27 NOTE — Patient Instructions (Addendum)
Good to see you today  I will be in touch with your labs and xray asap Assuming no other explanation I would suggest getting a sleep study for you We can try taking doxycycline for 10 days   Please give [facility] a call about your hand-  [phone]

## 2021-08-28 ENCOUNTER — Encounter: Payer: Self-pay | Admitting: Family Medicine

## 2021-08-28 LAB — COMPREHENSIVE METABOLIC PANEL
ALT: 24 U/L (ref 0–53)
AST: 19 U/L (ref 0–37)
Albumin: 4.4 g/dL (ref 3.5–5.2)
Alkaline Phosphatase: 57 U/L (ref 39–117)
BUN: 19 mg/dL (ref 6–23)
CO2: 27 mEq/L (ref 19–32)
Calcium: 9.5 mg/dL (ref 8.4–10.5)
Chloride: 100 mEq/L (ref 96–112)
Creatinine, Ser: 1.2 mg/dL (ref 0.40–1.50)
GFR: 62.28 mL/min (ref >60.00)
Glucose, Bld: 102 mg/dL — ABNORMAL HIGH (ref 70–99)
Potassium: 4.2 mEq/L (ref 3.5–5.1)
Sodium: 137 mEq/L (ref 135–145)
Total Bilirubin: 1.1 mg/dL (ref 0.2–1.2)
Total Protein: 7.1 g/dL (ref 6.0–8.3)

## 2021-08-28 LAB — CBC
HCT: 44.6 % (ref 39.0–52.0)
Hemoglobin: 15.1 g/dL (ref 13.0–17.0)
MCHC: 34 g/dL (ref 30.0–36.0)
MCV: 97.6 fl (ref 78.0–100.0)
Platelets: 226 10*3/uL (ref 150.0–400.0)
RBC: 4.57 Mil/uL (ref 4.22–5.81)
RDW: 13.1 % (ref 11.5–15.5)
WBC: 7.2 10*3/uL (ref 4.0–10.5)

## 2021-08-28 LAB — TSH: TSH: 3.02 u[IU]/mL (ref 0.35–5.50)

## 2021-08-28 LAB — SEDIMENTATION RATE: Sed Rate: 12 mm/hr (ref 0–20)

## 2021-08-28 LAB — FERRITIN: Ferritin: 268.4 ng/mL (ref 22.0–322.0)

## 2021-08-28 LAB — VITAMIN B12: Vitamin B-12: 121 pg/mL — ABNORMAL LOW (ref 211–911)

## 2021-08-28 LAB — D-DIMER, QUANTITATIVE: D-Dimer, Quant: 0.41 mcg/mL FEU (ref <0.50)

## 2021-08-28 LAB — BRAIN NATRIURETIC PEPTIDE: Pro B Natriuretic peptide (BNP): 25 pg/mL (ref 0.0–100.0)

## 2021-08-28 LAB — VITAMIN D 25 HYDROXY (VIT D DEFICIENCY, FRACTURES): VITD: 19.28 ng/mL — ABNORMAL LOW (ref 30.00–100.00)

## 2021-08-28 MED ORDER — VITAMIN D3 1.25 MG (50000 UT) PO CAPS: ORAL_CAPSULE | ORAL | 0 refills | Status: DC

## 2021-08-28 NOTE — Addendum Note (Signed)
Addended by: Abbe Amsterdam C on: 08/28/2021 12:44 PM   Modules accepted: Orders

## 2021-08-29 ENCOUNTER — Telehealth: Payer: Self-pay | Admitting: *Deleted

## 2021-08-29 NOTE — Telephone Encounter (Signed)
   Telephone encounter was:  Successful.  08/29/2021 Name: Joel Black MRN: [id number] DOB: 01-12-1956  [name] [name] is a 66 y.o. year old male who is a primary care patient of Copland, Gwenlyn Found, MD . The community resource team was consulted for assistance with Food Insecurity  Care guide performed the following interventions: Patient provided with information about care guide support team and interviewed to confirm resource needs. patient being pressured by economics , provided information if behind on utilites and also food banks and the food finder app Follow Up Plan:  No further follow up planned at this time. The patient has been provided with needed resources. Yehuda Mao Greenauer -[facility] [place], Population Health [phone] [address] [place] , [place] [address] Email : [name]

## 2021-09-04 ENCOUNTER — Encounter: Payer: Self-pay | Admitting: Family Medicine

## 2021-09-04 ENCOUNTER — Telehealth: Payer: Self-pay

## 2021-09-04 NOTE — Telephone Encounter (Signed)
Patient called office and stated his hair has been falling out , such as when he combs his hair it comes out in " clumps" .Marland Kitchen And stated it just started all of a sudden , wants advice .Marland Kitchen

## 2021-09-08 ENCOUNTER — Encounter: Payer: Self-pay | Admitting: Family Medicine

## 2021-09-08 ENCOUNTER — Ambulatory Visit (INDEPENDENT_AMBULATORY_CARE_PROVIDER_SITE_OTHER): Payer: Medicare HMO | Admitting: Family Medicine

## 2021-09-08 VITALS — BP 110/68 | HR 67 | Temp 98.1°F | Ht 71.5 in | Wt 190.2 lb

## 2021-09-08 DIAGNOSIS — L659 Nonscarring hair loss, unspecified: Secondary | ICD-10-CM | POA: Diagnosis not present

## 2021-09-08 LAB — TSH: TSH: 1.93 u[IU]/mL (ref 0.35–5.50)

## 2021-09-08 LAB — T4, FREE: Free T4: 0.73 ng/dL (ref 0.60–1.60)

## 2021-09-08 NOTE — Progress Notes (Signed)
Chief Complaint  Patient presents with   Alopecia    Subjective: Patient is a 66 y.o. male here for hair thinning.  More pronounced over the past 4-5 days. Clumps of hair are coming out when he brushes his hair. Mainly on the top. No hair thinning otherwise. No sig wt changes or bleeding. Eating is normal. Stress high 2/2 finances. No new topicals/hair products. Denies pain, slight itching. Dad has a full head of hair. Maternal uncle had some male patter baldness. Unsure at what age his uncle started this.   Past Medical History:  Diagnosis Date   Herniated cervical disc    History of kidney stones    Hypertension    MRSA (methicillin resistant Staphylococcus aureus)    Ulcer     Objective: BP 110/68   Pulse 67   Temp 98.1 F (36.7 C) (Oral)   Ht 5' 11.5" (1.816 m)   Wt 190 lb 4 oz (86.3 kg)   SpO2 98%   BMI 26.16 kg/m  General: Awake, appears stated age Neck: No thyromegaly, asymmetry Neuro: DTR's equal and symmetric in LE's Mouth: MMM Lungs: No accessory muscle use Psych: Age appropriate judgment and insight, normal affect and mood  Assessment and Plan: Hair thinning - Plan: Iron, TIBC and Ferritin Panel, TSH, T4, free  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 Ck labs. If workup neg, will consider finasteride vs minoxidil. F/u w reg pcp prn.  The patient voiced understanding and agreement to the plan.  Nery Frappier [place], DO 09/08/21  12:55 PM

## 2021-09-08 NOTE — Patient Instructions (Addendum)
Give [place] 2-3 business days to get the results of your labs back. If labs are normal, we can try a medication to halt the progress.   It won't hurt to apply tactile stimulation to your scalp, but tailor your expectations low.   Consider Vit B10.   Let us know if you need anything.

## 2021-09-09 ENCOUNTER — Other Ambulatory Visit: Payer: Self-pay | Admitting: Family Medicine

## 2021-09-09 LAB — IRON,TIBC AND FERRITIN PANEL
%SAT: 53 % (calc) — ABNORMAL HIGH (ref 20–48)
Ferritin: 355 ng/mL (ref 24–380)
Iron: 154 ug/dL (ref 50–180)
TIBC: 288 mcg/dL (calc) (ref 250–425)

## 2021-09-09 MED ORDER — FINASTERIDE 5 MG PO TABS
2.5000 mg | ORAL_TABLET | Freq: Every day | ORAL | 2 refills | Status: DC
Start: 2021-09-09 — End: 2021-11-11

## 2021-10-22 ENCOUNTER — Other Ambulatory Visit: Payer: Self-pay | Admitting: Family Medicine

## 2021-10-22 DIAGNOSIS — E785 Hyperlipidemia, unspecified: Secondary | ICD-10-CM

## 2021-10-22 DIAGNOSIS — I10 Essential (primary) hypertension: Secondary | ICD-10-CM

## 2021-11-11 ENCOUNTER — Encounter: Payer: Self-pay | Admitting: Family Medicine

## 2021-11-11 ENCOUNTER — Ambulatory Visit (INDEPENDENT_AMBULATORY_CARE_PROVIDER_SITE_OTHER): Payer: Medicare HMO | Admitting: Family Medicine

## 2021-11-11 VITALS — BP 112/72 | HR 84 | Temp 98.0°F | Ht 71.5 in | Wt 192.2 lb

## 2021-11-11 DIAGNOSIS — L659 Nonscarring hair loss, unspecified: Secondary | ICD-10-CM

## 2021-11-11 MED ORDER — MINOXIDIL 5 % EX SOLN: CUTANEOUS | 1 refills | Status: DC

## 2021-11-11 NOTE — Progress Notes (Signed)
Chief Complaint  Patient presents with   Alopecia    Subjective: Patient is a 66 y.o. male here for f/u.  The patient was seen around 2 months ago for hair thinning.  He was started on finasteride 2.5 mg daily after insurance would not cover 1 mg daily.  He was compliant and had no adverse effects but his hair on the head are completely gone.  His mustache is significantly thinned.  He has lost hair on the rest of his body as well including his nose and ears, which he is appreciative of.  Fortunately, he was able to secure an appointment with the dermatology team tomorrow.  He has not tried anything else so far.  Thyroid levels and iron levels were within normal limits.  Past Medical History:  Diagnosis Date   Herniated cervical disc    History of kidney stones    Hypertension    MRSA (methicillin resistant Staphylococcus aureus)    Ulcer     Objective: BP 112/72 (BP Location: Left Arm, Patient Position: Sitting, Cuff Size: Normal)   Pulse 84   Temp 98 F (36.7 C) (Oral)   Ht 5' 11.5" (1.816 m)   Wt 192 lb 4 oz (87.2 kg)   SpO2 97%   BMI 26.44 kg/m  General: Awake, appears stated age Skin: Sparse hairs noted on his scalp with significant alopecia noted, his mustache is quite thin from last visit as well; there is no erythema, scaling, excoriation, fluctuance, or drainage Lungs: No accessory muscle use Psych: Age appropriate judgment and insight, normal affect and mood  Assessment and Plan: Alopecia - Plan: MINOXIDIL, TOPICAL, 5 % SOLN  We will send in topical minoxidil to use twice daily.  He has an appointment with the dermatology team which she will see what they say tomorrow first.  Maybe they can do injections. The patient voiced understanding and agreement to the plan.  [place], DO 11/11/21  2:06 PM

## 2021-11-11 NOTE — Patient Instructions (Signed)
Hold on the medication until you see the dermatology team.   Let us know if you need anything.

## 2021-11-12 DIAGNOSIS — L989 Disorder of the skin and subcutaneous tissue, unspecified: Secondary | ICD-10-CM | POA: Diagnosis not present

## 2021-11-12 DIAGNOSIS — L638 Other alopecia areata: Secondary | ICD-10-CM | POA: Diagnosis not present

## 2021-11-13 DIAGNOSIS — L638 Other alopecia areata: Secondary | ICD-10-CM | POA: Diagnosis not present

## 2021-11-25 ENCOUNTER — Other Ambulatory Visit: Payer: Self-pay | Admitting: Family Medicine

## 2021-11-25 ENCOUNTER — Encounter: Payer: Self-pay | Admitting: Family Medicine

## 2021-11-25 DIAGNOSIS — L638 Other alopecia areata: Secondary | ICD-10-CM | POA: Diagnosis not present

## 2021-11-25 DIAGNOSIS — E559 Vitamin D deficiency, unspecified: Secondary | ICD-10-CM

## 2022-01-27 DIAGNOSIS — M5032 Other cervical disc degeneration, mid-cervical region, unspecified level: Secondary | ICD-10-CM | POA: Diagnosis not present

## 2022-01-27 DIAGNOSIS — M9903 Segmental and somatic dysfunction of lumbar region: Secondary | ICD-10-CM | POA: Diagnosis not present

## 2022-01-27 DIAGNOSIS — M5136 Other intervertebral disc degeneration, lumbar region: Secondary | ICD-10-CM | POA: Diagnosis not present

## 2022-01-27 DIAGNOSIS — M5431 Sciatica, right side: Secondary | ICD-10-CM | POA: Diagnosis not present

## 2022-01-27 DIAGNOSIS — M9905 Segmental and somatic dysfunction of pelvic region: Secondary | ICD-10-CM | POA: Diagnosis not present

## 2022-01-27 DIAGNOSIS — M9902 Segmental and somatic dysfunction of thoracic region: Secondary | ICD-10-CM | POA: Diagnosis not present

## 2022-02-03 DIAGNOSIS — M5032 Other cervical disc degeneration, mid-cervical region, unspecified level: Secondary | ICD-10-CM | POA: Diagnosis not present

## 2022-02-03 DIAGNOSIS — M9903 Segmental and somatic dysfunction of lumbar region: Secondary | ICD-10-CM | POA: Diagnosis not present

## 2022-02-03 DIAGNOSIS — M9905 Segmental and somatic dysfunction of pelvic region: Secondary | ICD-10-CM | POA: Diagnosis not present

## 2022-02-03 DIAGNOSIS — M5136 Other intervertebral disc degeneration, lumbar region: Secondary | ICD-10-CM | POA: Diagnosis not present

## 2022-02-03 DIAGNOSIS — M9902 Segmental and somatic dysfunction of thoracic region: Secondary | ICD-10-CM | POA: Diagnosis not present

## 2022-02-03 DIAGNOSIS — M5431 Sciatica, right side: Secondary | ICD-10-CM | POA: Diagnosis not present

## 2022-02-10 ENCOUNTER — Institutional Professional Consult (permissible substitution): Payer: Medicare HMO | Admitting: Plastic Surgery

## 2022-02-11 DIAGNOSIS — M9905 Segmental and somatic dysfunction of pelvic region: Secondary | ICD-10-CM | POA: Diagnosis not present

## 2022-02-11 DIAGNOSIS — M9903 Segmental and somatic dysfunction of lumbar region: Secondary | ICD-10-CM | POA: Diagnosis not present

## 2022-02-11 DIAGNOSIS — M9902 Segmental and somatic dysfunction of thoracic region: Secondary | ICD-10-CM | POA: Diagnosis not present

## 2022-02-11 DIAGNOSIS — M5032 Other cervical disc degeneration, mid-cervical region, unspecified level: Secondary | ICD-10-CM | POA: Diagnosis not present

## 2022-02-11 DIAGNOSIS — M5136 Other intervertebral disc degeneration, lumbar region: Secondary | ICD-10-CM | POA: Diagnosis not present

## 2022-02-11 DIAGNOSIS — M5431 Sciatica, right side: Secondary | ICD-10-CM | POA: Diagnosis not present

## 2022-02-16 DIAGNOSIS — M9905 Segmental and somatic dysfunction of pelvic region: Secondary | ICD-10-CM | POA: Diagnosis not present

## 2022-02-16 DIAGNOSIS — M9902 Segmental and somatic dysfunction of thoracic region: Secondary | ICD-10-CM | POA: Diagnosis not present

## 2022-02-16 DIAGNOSIS — M9903 Segmental and somatic dysfunction of lumbar region: Secondary | ICD-10-CM | POA: Diagnosis not present

## 2022-02-16 DIAGNOSIS — M5032 Other cervical disc degeneration, mid-cervical region, unspecified level: Secondary | ICD-10-CM | POA: Diagnosis not present

## 2022-02-16 DIAGNOSIS — M5136 Other intervertebral disc degeneration, lumbar region: Secondary | ICD-10-CM | POA: Diagnosis not present

## 2022-02-16 DIAGNOSIS — M5431 Sciatica, right side: Secondary | ICD-10-CM | POA: Diagnosis not present

## 2022-02-17 ENCOUNTER — Telehealth: Payer: Self-pay | Admitting: Plastic Surgery

## 2022-02-17 NOTE — Telephone Encounter (Signed)
LVM for pt to call back and r/s appt that he was unable to make due to him being sick.

## 2022-02-24 DIAGNOSIS — M5032 Other cervical disc degeneration, mid-cervical region, unspecified level: Secondary | ICD-10-CM | POA: Diagnosis not present

## 2022-02-24 DIAGNOSIS — M9903 Segmental and somatic dysfunction of lumbar region: Secondary | ICD-10-CM | POA: Diagnosis not present

## 2022-02-24 DIAGNOSIS — M5136 Other intervertebral disc degeneration, lumbar region: Secondary | ICD-10-CM | POA: Diagnosis not present

## 2022-02-24 DIAGNOSIS — M9905 Segmental and somatic dysfunction of pelvic region: Secondary | ICD-10-CM | POA: Diagnosis not present

## 2022-02-24 DIAGNOSIS — M9902 Segmental and somatic dysfunction of thoracic region: Secondary | ICD-10-CM | POA: Diagnosis not present

## 2022-02-24 DIAGNOSIS — M5431 Sciatica, right side: Secondary | ICD-10-CM | POA: Diagnosis not present

## 2022-03-25 DIAGNOSIS — M79642 Pain in left hand: Secondary | ICD-10-CM | POA: Diagnosis not present

## 2022-03-25 DIAGNOSIS — M79641 Pain in right hand: Secondary | ICD-10-CM | POA: Diagnosis not present

## 2022-04-07 DIAGNOSIS — H40013 Open angle with borderline findings, low risk, bilateral: Secondary | ICD-10-CM | POA: Diagnosis not present

## 2022-04-07 DIAGNOSIS — H02834 Dermatochalasis of left upper eyelid: Secondary | ICD-10-CM | POA: Diagnosis not present

## 2022-04-07 DIAGNOSIS — H02831 Dermatochalasis of right upper eyelid: Secondary | ICD-10-CM | POA: Diagnosis not present

## 2022-04-07 DIAGNOSIS — H2513 Age-related nuclear cataract, bilateral: Secondary | ICD-10-CM | POA: Diagnosis not present

## 2022-04-15 DIAGNOSIS — M720 Palmar fascial fibromatosis [Dupuytren]: Secondary | ICD-10-CM | POA: Diagnosis not present

## 2022-04-15 DIAGNOSIS — G8918 Other acute postprocedural pain: Secondary | ICD-10-CM | POA: Diagnosis not present

## 2022-04-24 DIAGNOSIS — M25642 Stiffness of left hand, not elsewhere classified: Secondary | ICD-10-CM | POA: Diagnosis not present

## 2022-04-24 DIAGNOSIS — M720 Palmar fascial fibromatosis [Dupuytren]: Secondary | ICD-10-CM | POA: Diagnosis not present

## 2022-05-12 DIAGNOSIS — M79641 Pain in right hand: Secondary | ICD-10-CM | POA: Diagnosis not present

## 2022-05-12 DIAGNOSIS — M720 Palmar fascial fibromatosis [Dupuytren]: Secondary | ICD-10-CM | POA: Diagnosis not present

## 2022-05-12 DIAGNOSIS — M79642 Pain in left hand: Secondary | ICD-10-CM | POA: Diagnosis not present

## 2022-05-26 ENCOUNTER — Institutional Professional Consult (permissible substitution): Payer: Medicare HMO | Admitting: Plastic Surgery

## 2022-06-12 ENCOUNTER — Other Ambulatory Visit: Payer: Self-pay | Admitting: Family Medicine

## 2022-06-12 ENCOUNTER — Ambulatory Visit (INDEPENDENT_AMBULATORY_CARE_PROVIDER_SITE_OTHER): Payer: Medicare HMO | Admitting: Family

## 2022-06-12 ENCOUNTER — Encounter: Payer: Self-pay | Admitting: Family

## 2022-06-12 VITALS — BP 128/80 | HR 94 | Ht 71.5 in | Wt 182.4 lb

## 2022-06-12 DIAGNOSIS — F418 Other specified anxiety disorders: Secondary | ICD-10-CM | POA: Diagnosis not present

## 2022-06-12 DIAGNOSIS — R197 Diarrhea, unspecified: Secondary | ICD-10-CM | POA: Diagnosis not present

## 2022-06-12 DIAGNOSIS — I10 Essential (primary) hypertension: Secondary | ICD-10-CM

## 2022-06-12 DIAGNOSIS — G4700 Insomnia, unspecified: Secondary | ICD-10-CM | POA: Diagnosis not present

## 2022-06-12 LAB — COMPREHENSIVE METABOLIC PANEL
ALT: 17 U/L (ref 0–53)
AST: 18 U/L (ref 0–37)
Albumin: 4.2 g/dL (ref 3.5–5.2)
Alkaline Phosphatase: 57 U/L (ref 39–117)
BUN: 15 mg/dL (ref 6–23)
CO2: 24 mEq/L (ref 19–32)
Calcium: 9.3 mg/dL (ref 8.4–10.5)
Chloride: 102 mEq/L (ref 96–112)
Creatinine, Ser: 1.14 mg/dL (ref 0.40–1.50)
GFR: 65.87 mL/min (ref >60.00)
Glucose, Bld: 93 mg/dL (ref 70–99)
Potassium: 3.6 mEq/L (ref 3.5–5.1)
Sodium: 138 mEq/L (ref 135–145)
Total Bilirubin: 1 mg/dL (ref 0.2–1.2)
Total Protein: 7.2 g/dL (ref 6.0–8.3)

## 2022-06-12 LAB — CBC WITH DIFFERENTIAL/PLATELET
Basophils Absolute: 0.1 10*3/uL (ref 0.0–0.1)
Basophils Relative: 1.3 % (ref 0.0–3.0)
Eosinophils Absolute: 0.2 10*3/uL (ref 0.0–0.7)
Eosinophils Relative: 2.4 % (ref 0.0–5.0)
HCT: 46.5 % (ref 39.0–52.0)
Hemoglobin: 15.8 g/dL (ref 13.0–17.0)
Lymphocytes Relative: 22.6 % (ref 12.0–46.0)
Lymphs Abs: 2 10*3/uL (ref 0.7–4.0)
MCHC: 34 g/dL (ref 30.0–36.0)
MCV: 96.8 fl (ref 78.0–100.0)
Monocytes Absolute: 1.1 10*3/uL — ABNORMAL HIGH (ref 0.1–1.0)
Monocytes Relative: 12.2 % — ABNORMAL HIGH (ref 3.0–12.0)
Neutro Abs: 5.5 10*3/uL (ref 1.4–7.7)
Neutrophils Relative %: 61.5 % (ref 43.0–77.0)
Platelets: 301 10*3/uL (ref 150.0–400.0)
RBC: 4.81 Mil/uL (ref 4.22–5.81)
RDW: 13.4 % (ref 11.5–15.5)
WBC: 9 10*3/uL (ref 4.0–10.5)

## 2022-06-12 MED ORDER — LORAZEPAM 0.5 MG PO TABS: 0.5000 mg | ORAL_TABLET | Freq: Every day | ORAL | 0 refills | Status: DC

## 2022-06-12 NOTE — Progress Notes (Signed)
Joel Black is a 67 y.o. male with the following history as recorded in EpicCare:  Patient Active Problem List   Diagnosis Date Noted   Vertigo 07/24/2017   Atypical chest pain 09/21/2015   BRBPR (bright red blood per rectum)    Stenosis, cervical spine 02/13/2015   Shortness of breath 03/19/2014   Solitary pulmonary nodule on lung CT 02/18/2014   HTN (hypertension) 10/21/2012    Current Outpatient Medications  Medication Sig Dispense Refill   Cholecalciferol (VITAMIN D3) 1.25 MG (50000 UT) CAPS Take 1 weekly for 12 weeks 12 capsule 0   hydrochlorothiazide (MICROZIDE) 12.5 MG capsule TAKE 1 CAPSULE BY MOUTH EVERY DAY 90 capsule 1   LORazepam (ATIVAN) 0.5 MG tablet Take 1 tablet (0.5 mg total) by mouth at bedtime. 20 tablet 0   MINOXIDIL, TOPICAL, 5 % SOLN Apply to affected areas twice daily. 120 mL 1   rosuvastatin (CRESTOR) 20 MG tablet TAKE 1 TABLET BY MOUTH EVERY DAY 90 tablet 1   losartan (COZAAR) 100 MG tablet Take 1 tablet (100 mg total) by mouth daily. 90 tablet 1   No current facility-administered medications for this visit.    Allergies: Patient has no known allergies.  Past Medical History:  Diagnosis Date   Herniated cervical disc    History of kidney stones    Hypertension    MRSA (methicillin resistant Staphylococcus aureus)    Ulcer     No past surgical history on file.  Family History  Problem Relation Age of Onset   Cancer Father        lung   Diabetes Sister     Social History   Tobacco Use   Smoking status: Former    Packs/day: 2.00    Years: 15.00    Additional pack years: 0.00    Total pack years: 30.00    Types: Cigarettes    Quit date: 06/11/1986    Years since quitting: 36.0   Smokeless tobacco: Never  Substance Use Topics   Alcohol use: Yes    Alcohol/week: 21.0 standard drinks of alcohol    Types: 21 Glasses of wine per week    Comment: 3-4 glasses with dinner    Subjective:   Patient has been struggling diarrhea x 6  weeks- no blood in his stool; stress level has been very high in the past few weeks also- lost his wife unexpectedly recently; not sleeping well and feels like stress making the diarrhea worse; however, the diarrhea was present before he lost his wife;  Does feel like he has had some night sweats but not sure if is anxiety/ stress related- felt like everything crashed in on him last night and sweats were more pronounced last night than normal." Using OTC Pepto Bismol;   Objective:  Vitals:   06/12/22 1128  BP: 128/80  Pulse: 94  SpO2: 98%  Weight: 182 lb 6.4 oz (82.7 kg)  Height: 5' 11.5" (1.816 m)    General: Well developed, well nourished, in no acute distress  Skin : Warm and dry.  Head: Normocephalic and atraumatic  Lungs: Respirations unlabored; clear to auscultation bilaterally without wheeze, rales, rhonchi  CVS exam: normal rate and regular rhythm.  Abdomen: Soft; nontender; nondistended; normoactive bowel sounds; no masses or hepatosplenomegaly  Musculoskeletal: No deformities; no active joint inflammation  Extremities: No edema, cyanosis, clubbing  Vessels: Symmetric bilaterally  Neurologic: Alert and oriented; speech intact; face symmetrical; moves all extremities well; CNII-XII intact without focal deficit   Assessment:  1. Diarrhea, unspecified type   2. Situational anxiety   3. Insomnia, unspecified type     Plan:  Will update CBC, CMP, stool culture today; need to rule out underlying infection vs stress reaction; keep planned follow up with his PCP for next week; Rx for Ativan 0.5 mg 1 tablet po qhs prn;   No follow-ups on file.  Orders Placed This Encounter  Procedures   Cdiff NAA+O+P+Stool Culture    Standing Status:   Future    Standing Expiration Date:   06/12/2023   CBC with Differential/Platelet   Comp Met (CMET)    Requested Prescriptions   Signed Prescriptions Disp Refills   LORazepam (ATIVAN) 0.5 MG tablet 20 tablet 0    Sig: Take 1 tablet (0.5 mg  total) by mouth at bedtime.

## 2022-06-15 LAB — SPECIMEN STATUS REPORT

## 2022-06-16 NOTE — Patient Instructions (Incomplete)
It was good to see you again today- I am so sorry for the recent loss of your beloved wife  Please let me know what I can do to help you  Ok to use lorazepam during the day for anxiety or panic I gave you some clonazepam/ klonopin to use for sleep.  Use either lorazepam or clonazepam, not both.  Avoid using these medications with alcohol, do not use when you need to drive  Okay to try Imodium as needed for your diarrhea.  I would also suggest adding an over-the-counter probiotic to see if that will help.  I will work on getting you in with a gastroenterologist!  Assuming all is well, lets plan to follow-up in about 6 months.  Sooner if needed!

## 2022-06-16 NOTE — Progress Notes (Unsigned)
[facility] [address], [address] [place], [place] [address] [phone]  Date:  06/17/2022   Name:  Joel Black   DOB:  [birth year]   MRN:  [id number]  PCP:  Joel Cables, MD    Chief Complaint: f/u diarrhea, greif (Pt was seen by Joel Black on 06/12/22. Pt says his diarrhea had been worsened d/t stress and the passing of his wife. )   History of Present Illness:  Joel Black is a 67 y.o. very pleasant male patient who presents with the following:  Seen today for follow-up visit.  Most recent visit with myself was in August 2023 History of hypertension, cervical stenosis, dupuytren contracture of palms -he is seeing Joel Black for this issue  He was seen by my partner Joel Black on May 24 when he was struggling with diarrhea and not sleeping well.  It turns out he had also very unexpectedly lost his wife of which Joel Black alerted me   His wife was in a terrible accident on 05/20/22 while horseback riding - she was in a coma but was taken off life support after 12 days and passed away.  They were able to donate her organs which gives him some comfort  They were together for 29 years  He has a lot of friends and family who are supporting him but obviously his grief is intense.  He states that he thinks he is through the worst of it now, his daughters plan to move in with him which is a comfort.  No suicidal ideation He is using lorazepam for anxiety and sleep but notes he is still not sleeping very well.  He may be able to stay asleep for 2 or 3 hours and then wakes up  Patient notes that his sister has celiac disease We have tried to get him to see GI/get screened for colon cancer in the past but so far he has not gone through with it  He developed diarrhea 5-6 weeks ago-no apparent cause that he can think of He is having diarrhea every day, perhaps 4-5 times a day No blood or mucus in the stool No  vomiting Decreased appetite  About a week ago he had a day of chills, possible fever but this did not last  He has not tried any imodium as of yet -lower instructed him to hold off on this until we got his stool studies back.  As of yet no sign of any infectious etiology, I advised him okay to try some Imodium if he would like.  Also would recommend a probiotic   Patient Active Problem List   Diagnosis Date Noted   Vertigo 07/24/2017   Atypical chest pain 09/21/2015   BRBPR (bright red blood per rectum)    Stenosis, cervical spine 02/13/2015   Shortness of breath 03/19/2014   Solitary pulmonary nodule on lung CT 02/18/2014   HTN (hypertension) 10/21/2012    Past Medical History:  Diagnosis Date   Herniated cervical disc    History of kidney stones    Hypertension    MRSA (methicillin resistant Staphylococcus aureus)    Ulcer     No past surgical history on file.  Social History   Tobacco Use   Smoking status: Former    Packs/day: 2.00    Years: 15.00    Additional pack years: 0.00    Total pack years: 30.00    Types: Cigarettes  Quit date: 06/11/1986    Years since quitting: 36.0   Smokeless tobacco: Never  Substance Use Topics   Alcohol use: Yes    Alcohol/week: 21.0 standard drinks of alcohol    Types: 21 Glasses of wine per week    Comment: 3-4 glasses with dinner   Drug use: No    Family History  Problem Relation Age of Onset   Cancer Father        lung   Diabetes Sister     No Known Allergies  Medication list has been reviewed and updated.  Current Outpatient Medications on File Prior to Visit  Medication Sig Dispense Refill   Cholecalciferol (VITAMIN D3) 1.25 MG (50000 UT) CAPS Take 1 weekly for 12 weeks 12 capsule 0   hydrochlorothiazide (MICROZIDE) 12.5 MG capsule TAKE 1 CAPSULE BY MOUTH EVERY DAY 90 capsule 1   LORazepam (ATIVAN) 0.5 MG tablet Take 1 tablet (0.5 mg total) by mouth at bedtime. 20 tablet 0   losartan (COZAAR) 100 MG tablet Take  1 tablet (100 mg total) by mouth daily. 90 tablet 1   MINOXIDIL, TOPICAL, 5 % SOLN Apply to affected areas twice daily. 120 mL 1   rosuvastatin (CRESTOR) 20 MG tablet TAKE 1 TABLET BY MOUTH EVERY DAY 90 tablet 1   No current facility-administered medications on file prior to visit.    Review of Systems:  As per HPI- otherwise negative. Wt Readings from Last 3 Encounters:  06/17/22 183 lb 3.2 oz (83.1 kg)  06/12/22 182 lb 6.4 oz (82.7 kg)  11/11/21 192 lb 4 oz (87.2 kg)     Physical Examination: Vitals:   06/17/22 1412  BP: 122/80  Pulse: 95  Resp: 18  Temp: 98.3 F (36.8 C)  SpO2: 95%   Vitals:   06/17/22 1412  Weight: 183 lb 3.2 oz (83.1 kg)  Height: 5' 11.5" (1.816 m)   Body mass index is 25.2 kg/m. Ideal Body Weight: Weight in (lb) to have BMI = 25: 181.4  GEN: no acute distress.  Normal weight, looks well HEENT: Atraumatic, Normocephalic.  Ears and Nose: No external deformity. CV: RRR, No M/G/R. No JVD. No thrill. No extra heart sounds. PULM: CTA B, no wheezes, crackles, rhonchi. No retractions. No resp. distress. No accessory muscle use. ABD: S, NT, ND, +BS. No rebound. No HSM. EXTR: No c/c/e PSYCH: Normally interactive. Conversant.   Assessment and Plan: Chronic diarrhea - Plan: Ambulatory referral to Gastroenterology  Adjustment insomnia - Plan: clonazePAM (KLONOPIN) 1 MG tablet  Grief  Patient seen today for follow-up.  Tragically, his wife was horribly injured and subsequently passed away in an accident the first of this month.  We spent some time talking about their enduring and loving relationship of 29 years. I gave him some clonazepam to try for sleep, this may be more helpful than lorazepam for helping him stay asleep We discussed using benzodiazepines safely  Referral to gastroenterology for colon cancer screening and also to look at diarrhea-----------------------------------  It was good to see you again today- I am so sorry for the recent  loss of your beloved wife  Please let me know what I can do to help you  Ok to use lorazepam during the day for anxiety or panic I gave you some clonazepam/ klonopin to use for sleep.  Use either lorazepam or clonazepam, not both.  Avoid using these medications with alcohol, do not use when you need to drive  Okay to try Imodium as needed for  your diarrhea.  I would also suggest adding an over-the-counter probiotic to see if that will help.  I will work on getting you in with a gastroenterologist!  Assuming all is well, lets plan to follow-up in about 6 months.  Sooner if needed!   Signed Abbe Amsterdam, MD

## 2022-06-17 ENCOUNTER — Ambulatory Visit (INDEPENDENT_AMBULATORY_CARE_PROVIDER_SITE_OTHER): Payer: Medicare HMO | Admitting: Family Medicine

## 2022-06-17 VITALS — BP 122/80 | HR 95 | Temp 98.3°F | Resp 18 | Ht 71.5 in | Wt 183.2 lb

## 2022-06-17 DIAGNOSIS — K529 Noninfective gastroenteritis and colitis, unspecified: Secondary | ICD-10-CM

## 2022-06-17 DIAGNOSIS — F5102 Adjustment insomnia: Secondary | ICD-10-CM | POA: Diagnosis not present

## 2022-06-17 DIAGNOSIS — F4321 Adjustment disorder with depressed mood: Secondary | ICD-10-CM

## 2022-06-17 LAB — CDIFF NAA+O+P+STOOL CULTURE
E coli, Shiga toxin Assay: NEGATIVE
Toxigenic C. Difficile by PCR: NEGATIVE

## 2022-06-17 MED ORDER — CLONAZEPAM 1 MG PO TABS: ORAL_TABLET | ORAL | 1 refills | Status: DC

## 2022-06-21 ENCOUNTER — Other Ambulatory Visit: Payer: Self-pay | Admitting: Family Medicine

## 2022-06-21 DIAGNOSIS — I10 Essential (primary) hypertension: Secondary | ICD-10-CM

## 2022-06-23 DIAGNOSIS — Z0100 Encounter for examination of eyes and vision without abnormal findings: Secondary | ICD-10-CM | POA: Diagnosis not present

## 2022-06-23 DIAGNOSIS — H40013 Open angle with borderline findings, low risk, bilateral: Secondary | ICD-10-CM | POA: Diagnosis not present

## 2022-07-07 DIAGNOSIS — M720 Palmar fascial fibromatosis [Dupuytren]: Secondary | ICD-10-CM | POA: Diagnosis not present

## 2022-07-21 DIAGNOSIS — H40013 Open angle with borderline findings, low risk, bilateral: Secondary | ICD-10-CM | POA: Diagnosis not present

## 2022-09-09 DIAGNOSIS — Z809 Family history of malignant neoplasm, unspecified: Secondary | ICD-10-CM | POA: Diagnosis not present

## 2022-09-09 DIAGNOSIS — F419 Anxiety disorder, unspecified: Secondary | ICD-10-CM | POA: Diagnosis not present

## 2022-09-09 DIAGNOSIS — E785 Hyperlipidemia, unspecified: Secondary | ICD-10-CM | POA: Diagnosis not present

## 2022-09-09 DIAGNOSIS — H547 Unspecified visual loss: Secondary | ICD-10-CM | POA: Diagnosis not present

## 2022-09-09 DIAGNOSIS — Z8249 Family history of ischemic heart disease and other diseases of the circulatory system: Secondary | ICD-10-CM | POA: Diagnosis not present

## 2022-09-09 DIAGNOSIS — I10 Essential (primary) hypertension: Secondary | ICD-10-CM | POA: Diagnosis not present

## 2022-09-09 DIAGNOSIS — Z833 Family history of diabetes mellitus: Secondary | ICD-10-CM | POA: Diagnosis not present

## 2022-09-09 DIAGNOSIS — Z87891 Personal history of nicotine dependence: Secondary | ICD-10-CM | POA: Diagnosis not present

## 2022-10-14 ENCOUNTER — Telehealth: Payer: Self-pay | Admitting: Family Medicine

## 2022-10-14 NOTE — Telephone Encounter (Signed)
Copied from CRM [id number]. Topic: Medicare AWV >> Oct 14, 2022  9:30 AM [name] wrote: Reason for CRM: LM 10/14/2022 to schedule AWV   Verlee Rossetti; Care Guide Ambulatory Clinical Support Maria Antonia l [facility] Direct Dial: [phone]

## 2022-10-23 ENCOUNTER — Other Ambulatory Visit: Payer: Self-pay | Admitting: Family Medicine

## 2022-10-23 DIAGNOSIS — Z1211 Encounter for screening for malignant neoplasm of colon: Secondary | ICD-10-CM

## 2022-10-23 DIAGNOSIS — Z1212 Encounter for screening for malignant neoplasm of rectum: Secondary | ICD-10-CM

## 2022-10-28 ENCOUNTER — Ambulatory Visit: Payer: Medicare HMO

## 2022-10-28 VITALS — BP 156/89 | HR 101 | Ht 71.5 in | Wt 189.8 lb

## 2022-10-28 DIAGNOSIS — Z0000 Encounter for general adult medical examination without abnormal findings: Secondary | ICD-10-CM

## 2022-10-28 NOTE — Patient Instructions (Signed)
Joel Black , Thank you for taking time to come for your Medicare Wellness Visit. I appreciate your ongoing commitment to your health goals. Please review the following plan we discussed and let me know if I can assist you in the future.      This is a list of the screening recommended for you and due dates:  Health Maintenance  Topic Date Due   Cologuard (Stool DNA test)  Never done   Zoster (Shingles) Vaccine (1 of 2) Never done   Pneumonia Vaccine (1 of 1 - PCV) Never done   COVID-19 Vaccine (1 - 2023-24 season) Never done   Flu Shot  04/19/2023*   Medicare Annual Wellness Visit  10/28/2023   DTaP/Tdap/Td vaccine (2 - Td or Tdap) 02/12/2025   Hepatitis C Screening  Completed   HPV Vaccine  Aged Out  *Topic was postponed. The date shown is not the original due date.    Next appointment: Follow up in one year for your annual wellness visit.   Preventive Care 11 Years and Older, Male Preventive care refers to lifestyle choices and visits with your health care provider that can promote health and wellness. What does preventive care include? A yearly physical exam. This is also called an annual well check. Dental exams once or twice a year. Routine eye exams. Ask your health care provider how often you should have your eyes checked. Personal lifestyle choices, including: Daily care of your teeth and gums. Regular physical activity. Eating a healthy diet. Avoiding tobacco and drug use. Limiting alcohol use. Practicing safe sex. Taking low doses of aspirin every day. Taking vitamin and mineral supplements as recommended by your health care provider. What happens during an annual well check? The services and screenings done by your health care provider during your annual well check will depend on your age, overall health, lifestyle risk factors, and family history of disease. Counseling  Your health care provider may ask you questions about your: Alcohol use. Tobacco use. Drug  use. Emotional well-being. Home and relationship well-being. Sexual activity. Eating habits. History of falls. Memory and ability to understand (cognition). Work and work Astronomer. Screening  You may have the following tests or measurements: Height, weight, and BMI. Blood pressure. Lipid and cholesterol levels. These may be checked every 5 years, or more frequently if you are over 64 years old. Skin check. Lung cancer screening. You may have this screening every year starting at age 64 if you have a 30-pack-year history of smoking and currently smoke or have quit within the past 15 years. Fecal occult blood test (FOBT) of the stool. You may have this test every year starting at age 54. Flexible sigmoidoscopy or colonoscopy. You may have a sigmoidoscopy every 5 years or a colonoscopy every 10 years starting at age 74. Prostate cancer screening. Recommendations will vary depending on your family history and other risks. Hepatitis C blood test. Hepatitis B blood test. Sexually transmitted disease (STD) testing. Diabetes screening. This is done by checking your blood sugar (glucose) after you have not eaten for a while (fasting). You may have this done every 1-3 years. Abdominal aortic aneurysm (AAA) screening. You may need this if you are a current or former smoker. Osteoporosis. You may be screened starting at age 49 if you are at high risk. Talk with your health care provider about your test results, treatment options, and if necessary, the need for more tests. Vaccines  Your health care provider may recommend certain vaccines, such  as: Influenza vaccine. This is recommended every year. Tetanus, diphtheria, and acellular pertussis (Tdap, Td) vaccine. You may need a Td booster every 10 years. Zoster vaccine. You may need this after age 30. Pneumococcal 13-valent conjugate (PCV13) vaccine. One dose is recommended after age 43. Pneumococcal polysaccharide (PPSV23) vaccine. One dose is  recommended after age 41. Talk to your health care provider about which screenings and vaccines you need and how often you need them. This information is not intended to replace advice given to you by your health care provider. Make sure you discuss any questions you have with your health care provider. Document Released: 02/01/2015 Document Revised: 09/25/2015 Document Reviewed: 11/06/2014 Elsevier Interactive Patient Education  2017 [employer].  Fall Prevention in the Home Falls can cause injuries. They can happen to people of all ages. There are many things you can do to make your home safe and to help prevent falls. What can I do on the outside of my home? Regularly fix the edges of walkways and driveways and fix any cracks. Remove anything that might make you trip as you walk through a door, such as a raised step or threshold. Trim any bushes or trees on the path to your home. Use bright outdoor lighting. Clear any walking paths of anything that might make someone trip, such as rocks or tools. Regularly check to see if handrails are loose or broken. Make sure that both sides of any steps have handrails. Any raised decks and porches should have guardrails on the edges. Have any leaves, snow, or ice cleared regularly. Use sand or salt on walking paths during winter. Clean up any spills in your garage right away. This includes oil or grease spills. What can I do in the bathroom? Use night lights. Install grab bars by the toilet and in the tub and shower. Do not use towel bars as grab bars. Use non-skid mats or decals in the tub or shower. If you need to sit down in the shower, use a plastic, non-slip stool. Keep the floor dry. Clean up any water that spills on the floor as soon as it happens. Remove soap buildup in the tub or shower regularly. Attach bath mats securely with double-sided non-slip rug tape. Do not have throw rugs and other things on the floor that can make you  trip. What can I do in the bedroom? Use night lights. Make sure that you have a light by your bed that is easy to reach. Do not use any sheets or blankets that are too big for your bed. They should not hang down onto the floor. Have a firm chair that has side arms. You can use this for support while you get dressed. Do not have throw rugs and other things on the floor that can make you trip. What can I do in the kitchen? Clean up any spills right away. Avoid walking on wet floors. Keep items that you use a lot in easy-to-reach places. If you need to reach something above you, use a strong step stool that has a grab bar. Keep electrical cords out of the way. Do not use floor polish or wax that makes floors slippery. If you must use wax, use non-skid floor wax. Do not have throw rugs and other things on the floor that can make you trip. What can I do with my stairs? Do not leave any items on the stairs. Make sure that there are handrails on both sides of the stairs and use  them. Fix handrails that are broken or loose. Make sure that handrails are as long as the stairways. Check any carpeting to make sure that it is firmly attached to the stairs. Fix any carpet that is loose or worn. Avoid having throw rugs at the top or bottom of the stairs. If you do have throw rugs, attach them to the floor with carpet tape. Make sure that you have a light switch at the top of the stairs and the bottom of the stairs. If you do not have them, ask someone to add them for you. What else can I do to help prevent falls? Wear shoes that: Do not have high heels. Have rubber bottoms. Are comfortable and fit you well. Are closed at the toe. Do not wear sandals. If you use a stepladder: Make sure that it is fully opened. Do not climb a closed stepladder. Make sure that both sides of the stepladder are locked into place. Ask someone to hold it for you, if possible. Clearly mark and make sure that you can  see: Any grab bars or handrails. First and last steps. Where the edge of each step is. Use tools that help you move around (mobility aids) if they are needed. These include: Canes. Walkers. Scooters. Crutches. Turn on the lights when you go into a dark area. Replace any light bulbs as soon as they burn out. Set up your furniture so you have a clear path. Avoid moving your furniture around. If any of your floors are uneven, fix them. If there are any pets around you, be aware of where they are. Review your medicines with your doctor. Some medicines can make you feel dizzy. This can increase your chance of falling. Ask your doctor what other things that you can do to help prevent falls. This information is not intended to replace advice given to you by your health care provider. Make sure you discuss any questions you have with your health care provider. Document Released: 11/01/2008 Document Revised: 06/13/2015 Document Reviewed: 02/09/2014 Elsevier Interactive Patient Education  2017 [employer].

## 2022-10-28 NOTE — Progress Notes (Signed)
Subjective:   Joel Black is a 67 y.o. male who presents for Medicare Annual/Subsequent preventive examination.  Visit Complete: In person  Cardiac Risk Factors include: advanced age (>80men, >48 women);hypertension;male gender;dyslipidemia     Objective:    Today's Vitals   10/28/22 1420 10/28/22 1509  BP: (!) 149/85 (!) 156/89  Pulse: 99 (!) 101  Weight: 189 lb 12.8 oz (86.1 kg)   Height: 5' 11.5" (1.816 m)    Body mass index is 26.1 kg/m.     10/28/2022    3:33 PM 08/25/2021    1:19 PM 02/03/2016    8:45 PM 09/21/2015    9:07 AM  Advanced Directives  Does Patient Have a Medical Advance Directive? No No No No  Would patient like information on creating a medical advance directive? No - Patient declined  No - Patient declined No - patient declined information    Current Medications (verified) Outpatient Encounter Medications as of 10/28/2022  Medication Sig   Cholecalciferol (VITAMIN D3) 1.25 MG (50000 UT) CAPS Take 1 weekly for 12 weeks   clonazePAM (KLONOPIN) 1 MG tablet Take 0.5 mg at bedtime as needed for sleep.  Ok to increase up to 2 mg gradually if needed   hydrochlorothiazide (MICROZIDE) 12.5 MG capsule TAKE 1 CAPSULE BY MOUTH EVERY DAY   LORazepam (ATIVAN) 0.5 MG tablet Take 1 tablet (0.5 mg total) by mouth at bedtime.   losartan (COZAAR) 100 MG tablet Take 1 tablet (100 mg total) by mouth daily.   MINOXIDIL, TOPICAL, 5 % SOLN Apply to affected areas twice daily.   rosuvastatin (CRESTOR) 20 MG tablet TAKE 1 TABLET BY MOUTH EVERY DAY   No facility-administered encounter medications on file as of 10/28/2022.    Allergies (verified) Patient has no known allergies.   History: Past Medical History:  Diagnosis Date   Herniated cervical disc    History of kidney stones    Hypertension    MRSA (methicillin resistant Staphylococcus aureus)    Ulcer    History reviewed. No pertinent surgical history. Family History  Problem Relation Age of Onset    Cancer Father        lung   Diabetes Sister    Social History   Socioeconomic History   Marital status: Widowed    Spouse name: Not on file   Number of children: Not on file   Years of education: Not on file   Highest education level: Not on file  Occupational History   Occupation: artist/sculptor   Tobacco Use   Smoking status: Former    Current packs/day: 0.00    Average packs/day: 2.0 packs/day for 15.0 years (30.0 ttl pk-yrs)    Types: Cigarettes    Start date: 06/11/1971    Quit date: 06/11/1986    Years since quitting: 36.4   Smokeless tobacco: Never  Substance and Sexual Activity   Alcohol use: Yes    Alcohol/week: 21.0 standard drinks of alcohol    Types: 21 Glasses of wine per week    Comment: 3-4 glasses with dinner   Drug use: No   Sexual activity: Yes    Partners: Female  Other Topics Concern   Not on file  Social History Narrative   Not on file   Social Determinants of Health   Financial Resource Strain: Low Risk  (10/28/2022)   Overall Financial Resource Strain (CARDIA)    Difficulty of Paying Living Expenses: Not hard at all  Food Insecurity: No Food Insecurity (10/28/2022)  Hunger Vital Sign    Worried About Running Out of Food in the Last Year: Never true    Ran Out of Food in the Last Year: Never true  Transportation Needs: No Transportation Needs (10/28/2022)   PRAPARE - Administrator, Civil Service (Medical): No    Lack of Transportation (Non-Medical): No  Physical Activity: Inactive (10/28/2022)   Exercise Vital Sign    Days of Exercise per Week: 0 days    Minutes of Exercise per Session: 0 min  Stress: No Stress Concern Present (10/28/2022)   [employer] of Occupational Health - Occupational Stress Questionnaire    Feeling of Stress : Only a little  Social Connections: Moderately Isolated (10/28/2022)   Social Connection and Isolation Panel [NHANES]    Frequency of Communication with Friends and Family: More than three times a  week    Frequency of Social Gatherings with Friends and Family: Once a week    Attends Religious Services: More than 4 times per year    Active Member of [employer] or Organizations: No    Attends Banker Meetings: Never    Marital Status: Widowed    Tobacco Counseling Counseling given: Not Answered   Clinical Intake:  Pre-visit preparation completed: Yes  Pain : No/denies pain  BMI - recorded: 26.1 Nutritional Risks: None Diabetes: No  How often do you need to have someone help you when you read instructions, pamphlets, or other written materials from your doctor or pharmacy?: 1 - Never  Interpreter Needed?: No  Information entered by :: Donne Anon, CMA   Activities of Daily Living    10/28/2022    2:25 PM  In your present state of health, do you have any difficulty performing the following activities:  Hearing? 0  Vision? 1  Comment weaker in left eye  Difficulty concentrating or making decisions? 0  Walking or climbing stairs? 0  Dressing or bathing? 0  Doing errands, shopping? 0  Preparing Food and eating ? N  Using the Toilet? N  In the past six months, have you accidently leaked urine? N  Do you have problems with loss of bowel control? N  Managing your Medications? N  Managing your Finances? N  Housekeeping or managing your Housekeeping? N    Patient Care Team: Copland, Gwenlyn Found, MD as PCP - General (Family Medicine)  Indicate any recent Medical Services you may have received from other than [facility] providers in the past year (date may be approximate).     Assessment:   This is a routine wellness examination for [place].  Hearing/Vision screen No results found.   Goals Addressed   None    Depression Screen    10/28/2022    3:05 PM 08/25/2021    1:21 PM 10/16/2020    4:14 PM 07/24/2017   10:05 AM 12/26/2015    9:25 AM 02/13/2015    9:03 AM  PHQ 2/9 Scores  PHQ - 2 Score 1 1 0 0 0 0    Fall Risk    10/28/2022    2:35 PM 06/12/2022    11:36 AM 08/25/2021    1:20 PM 10/16/2020    4:15 PM 07/24/2017   10:05 AM  Fall Risk   Falls in the past year? 0 0 0 0 No  Number falls in past yr: 0 0 0 0   Injury with Fall? 0 0 0 0   Risk for fall due to : No Fall Risks No Fall Risks  Follow up Falls evaluation completed Falls evaluation completed Falls prevention discussed      MEDICARE RISK AT HOME: Medicare Risk at Home Any stairs in or around the home?: Yes If so, are there any without handrails?: No Home free of loose throw rugs in walkways, pet beds, electrical cords, etc?: Yes Adequate lighting in your home to reduce risk of falls?: Yes Life alert?: No Use of a cane, walker or w/c?: No Grab bars in the bathroom?: No Shower chair or bench in shower?: No Elevated toilet seat or a handicapped toilet?: No  TIMED UP AND GO:  Was the test performed?  Yes  Length of time to ambulate 10 feet: 5 sec Gait steady and fast without use of assistive device    Cognitive Function:    10/28/2022    2:58 PM  MMSE - Mini Mental State Exam  Not completed: Unable to complete        Immunizations Immunization History  Administered Date(s) Administered   Tdap 02/13/2015    TDAP status: Up to date  Flu Vaccine status: Declined, Education has been provided regarding the importance of this vaccine but patient still declined. Advised may receive this vaccine at local pharmacy or Health Dept. Aware to provide a copy of the vaccination record if obtained from local pharmacy or Health Dept. Verbalized acceptance and understanding.  Pneumococcal vaccine status: Declined,  Education has been provided regarding the importance of this vaccine but patient still declined. Advised may receive this vaccine at local pharmacy or Health Dept. Aware to provide a copy of the vaccination record if obtained from local pharmacy or Health Dept. Verbalized acceptance and understanding.   Covid-19 vaccine status: Information provided on how to obtain  vaccines.   Qualifies for Shingles Vaccine? Yes   Zostavax completed No   Shingrix Completed?: No.    Education has been provided regarding the importance of this vaccine. Patient has been advised to call insurance company to determine out of pocket expense if they have not yet received this vaccine. Advised may also receive vaccine at local pharmacy or Health Dept. Verbalized acceptance and understanding.  Screening Tests Health Maintenance  Topic Date Due   Fecal DNA (Cologuard)  Never done   Zoster Vaccines- Shingrix (1 of 2) Never done   Pneumonia Vaccine 50+ Years old (1 of 1 - PCV) Never done   Medicare Annual Wellness (AWV)  08/26/2022   COVID-19 Vaccine (1 - 2023-24 season) Never done   INFLUENZA VACCINE  04/19/2023 (Originally 08/20/2022)   DTaP/Tdap/Td (2 - Td or Tdap) 02/12/2025   Hepatitis C Screening  Completed   HPV VACCINES  Aged Out    Health Maintenance  Health Maintenance Due  Topic Date Due   Fecal DNA (Cologuard)  Never done   Zoster Vaccines- Shingrix (1 of 2) Never done   Pneumonia Vaccine 80+ Years old (1 of 1 - PCV) Never done   Medicare Annual Wellness (AWV)  08/26/2022   COVID-19 Vaccine (1 - 2023-24 season) Never done    Cologuard ordered on 10/23/22  Lung Cancer Screening: (Low Dose CT Chest recommended if Age 104-80 years, 20 pack-year currently smoking OR have quit w/in 15years.) does not qualify.   Additional Screening:  Hepatitis C Screening: does qualify; Completed 02/13/15  Vision Screening: Recommended annual ophthalmology exams for early detection of glaucoma and other disorders of the eye. Is the patient up to date with their annual eye exam?  Yes  Who is the provider or what is  the name of the office in which the patient attends annual eye exams? Can't remember name at this time If pt is not established with a provider, would they like to be referred to a provider to establish care? No .   Dental Screening: Recommended annual dental exams  for proper oral hygiene  Diabetic Foot Exam: N/a  Community Resource Referral / Chronic Care Management: CRR required this visit?  No   CCM required this visit?  No     Plan:     I have personally reviewed and noted the following in the patient's chart:   Medical and social history Use of alcohol, tobacco or illicit drugs  Current medications and supplements including opioid prescriptions. Patient is not currently taking opioid prescriptions. Functional ability and status Nutritional status Physical activity Advanced directives List of other physicians Hospitalizations, surgeries, and ER visits in previous 12 months Vitals Screenings to include cognitive, depression, and falls Referrals and appointments  In addition, I have reviewed and discussed with patient certain preventive protocols, quality metrics, and best practice recommendations. A written personalized care plan for preventive services as well as general preventive health recommendations were provided to patient.     Donne Anon, CMA   10/28/2022   After Visit Summary: Sent to mychart.  Nurse Notes: None

## 2022-10-30 ENCOUNTER — Encounter: Payer: Self-pay | Admitting: Family Medicine

## 2022-11-18 NOTE — Telephone Encounter (Signed)
Joel Black had not yet read [employer].  I called and left message on machine.  Please get in touch with me so we can make a plan

## 2022-11-19 ENCOUNTER — Ambulatory Visit (INDEPENDENT_AMBULATORY_CARE_PROVIDER_SITE_OTHER): Payer: Medicare HMO | Admitting: Family Medicine

## 2022-11-19 ENCOUNTER — Encounter: Payer: Self-pay | Admitting: Family Medicine

## 2022-11-19 ENCOUNTER — Ambulatory Visit (HOSPITAL_BASED_OUTPATIENT_CLINIC_OR_DEPARTMENT_OTHER)
Admission: RE | Admit: 2022-11-19 | Discharge: 2022-11-19 | Disposition: A | Discharge status: Home / Self Care | Payer: Medicare HMO | Source: Ambulatory Visit | Attending: Family Medicine | Admitting: Family Medicine

## 2022-11-19 VITALS — BP 136/82 | HR 85 | Resp 18 | Ht 71.5 in | Wt 192.6 lb

## 2022-11-19 DIAGNOSIS — R197 Diarrhea, unspecified: Secondary | ICD-10-CM

## 2022-11-19 DIAGNOSIS — R0609 Other forms of dyspnea: Secondary | ICD-10-CM | POA: Diagnosis not present

## 2022-11-19 DIAGNOSIS — I10 Essential (primary) hypertension: Secondary | ICD-10-CM

## 2022-11-19 DIAGNOSIS — R748 Abnormal levels of other serum enzymes: Secondary | ICD-10-CM

## 2022-11-19 DIAGNOSIS — F4321 Adjustment disorder with depressed mood: Secondary | ICD-10-CM

## 2022-11-19 DIAGNOSIS — Z13220 Encounter for screening for lipoid disorders: Secondary | ICD-10-CM | POA: Diagnosis not present

## 2022-11-19 DIAGNOSIS — Z125 Encounter for screening for malignant neoplasm of prostate: Secondary | ICD-10-CM

## 2022-11-19 DIAGNOSIS — J929 Pleural plaque without asbestos: Secondary | ICD-10-CM | POA: Diagnosis not present

## 2022-11-19 DIAGNOSIS — R0602 Shortness of breath: Secondary | ICD-10-CM | POA: Diagnosis not present

## 2022-11-19 LAB — COMPREHENSIVE METABOLIC PANEL
ALT: 23 U/L (ref 0–53)
AST: 21 U/L (ref 0–37)
Albumin: 4.4 g/dL (ref 3.5–5.2)
Alkaline Phosphatase: 65 U/L (ref 39–117)
BUN: 17 mg/dL (ref 6–23)
CO2: 32 meq/L (ref 19–32)
Calcium: 10.4 mg/dL (ref 8.4–10.5)
Chloride: 99 meq/L (ref 96–112)
Creatinine, Ser: 1.27 mg/dL (ref 0.40–1.50)
GFR: 57.68 mL/min — ABNORMAL LOW (ref >60.00)
Glucose, Bld: 89 mg/dL (ref 70–99)
Potassium: 4.5 meq/L (ref 3.5–5.1)
Sodium: 141 meq/L (ref 135–145)
Total Bilirubin: 0.7 mg/dL (ref 0.2–1.2)
Total Protein: 7.4 g/dL (ref 6.0–8.3)

## 2022-11-19 LAB — LIPID PANEL
Cholesterol: 264 mg/dL — ABNORMAL HIGH (ref 0–200)
HDL: 70.1 mg/dL (ref >39.00)
LDL Cholesterol: 144 mg/dL — ABNORMAL HIGH (ref 0–99)
NonHDL: 193.83
Total CHOL/HDL Ratio: 4
Triglycerides: 250 mg/dL — ABNORMAL HIGH (ref 0.0–149.0)
VLDL: 50 mg/dL — ABNORMAL HIGH (ref 0.0–40.0)

## 2022-11-19 LAB — CBC
HCT: 46.1 % (ref 39.0–52.0)
Hemoglobin: 15 g/dL (ref 13.0–17.0)
MCHC: 32.6 g/dL (ref 30.0–36.0)
MCV: 100.4 fL — ABNORMAL HIGH (ref 78.0–100.0)
Platelets: 267 10*3/uL (ref 150.0–400.0)
RBC: 4.59 Mil/uL (ref 4.22–5.81)
RDW: 13.7 % (ref 11.5–15.5)
WBC: 7.4 10*3/uL (ref 4.0–10.5)

## 2022-11-19 LAB — LIPASE: Lipase: 94 U/L — ABNORMAL HIGH (ref 11.0–59.0)

## 2022-11-19 LAB — TSH: TSH: 3.97 u[IU]/mL (ref 0.35–5.50)

## 2022-11-19 LAB — PSA: PSA: 2.03 ng/mL (ref 0.10–4.00)

## 2022-11-19 MED ORDER — BUPROPION HCL ER (XL) 150 MG PO TB24: 150.0000 mg | ORAL_TABLET | Freq: Every day | ORAL | 3 refills | Status: DC

## 2022-11-19 NOTE — Patient Instructions (Signed)
It was wonderful to see you today- but I am sorry you are going through such a hard time of loss  Let's have you start on wellbutrin 150 mg daily- can increase to 300 mg in a week or so as needed Please let me know how this works out for you You might also look into a support group or other counseling opportunity   We will check labs today- I will look for any explanation for your diarrhea, check lipids and also recheck your PSA test  Please complete your cologuard kit as soon as you can!   I will be in touch with your chest x-ray report and will set you up for a treadmill test for your heart

## 2022-11-19 NOTE — Addendum Note (Signed)
Addended by: Abbe Amsterdam C on: 11/19/2022 06:19 PM   Modules accepted: Orders

## 2022-11-19 NOTE — Progress Notes (Addendum)
[facility] [address], [address] [place], [place] [address] [phone]  Date:  11/19/2022   Name:  Joel Black   DOB:  May 21, 1955   MRN:  [id number]  PCP:  Pearline Cables, MD    Chief Complaint: Fatigue (Diarrhea onset 2 weeks seems more fatigued than normal /Pt states he knows he is "no longer motivated" and that is not normal )   History of Present Illness:  Joel Black is a 67 y.o. very pleasant male patient who presents with the following:  Pt seen today for a recheck visit  Last seen by myself in late May   His wife was in a terrible accident on 05/20/22 while horseback riding - she was in a coma but was taken off life support after 12 days and passed away.  They were able to donate her organs which gives him some comfort . They were together for 29 years   I put him on some prn klonopin for sleep at our last visit   Tomorrow will be 6 months since he lost his wife so suddenly Joel Black feels that he is experiencing depression and would like some help. He is also feeling run down- he has noted diarrhea for about 2 weeks and some heartburn  Diarrhea may occur in the early am- he is having to get up at 3am or so to go. May have 2-3 BM per day No blood or mucus in the stool  He did take some imodium today- not sure how much this will help yet  No vomiting  From a depression standpoint, he notes persistent feelings of sadness.  He does not feel like getting up in the morning, feels less motivated to do things he is typically interested in He sleeps pretty well He is getting out and seeing friends, he is finishing up a book about his life with Abran Duke Appeite is ok  No SI   He is not really using any benzos at this time He is not doing any support group or counseling at his time  No anxiety symptoms  He still has not done colon cancer screening.  He does have Cologuard kit at home.  I encouraged  him to complete this as soon as his diarrhea resolves Also, looking back we never followed up on his increased PSA from about 2 years ago.  We will certainly do this today  He denies any chest pain but notes he may get winded more easily with exercise such as climbing up stairs.  He did do a treadmill test but this was several years ago, 2017 He has noted easy fatigability for 3 or 4 months.  Patient Active Problem List   Diagnosis Date Noted   Vertigo 07/24/2017   Atypical chest pain 09/21/2015   BRBPR (bright red blood per rectum)    Stenosis, cervical spine 02/13/2015   Shortness of breath 03/19/2014   Solitary pulmonary nodule on lung CT 02/18/2014   HTN (hypertension) 10/21/2012    Past Medical History:  Diagnosis Date   Herniated cervical disc    History of kidney stones    Hypertension    MRSA (methicillin resistant Staphylococcus aureus)    Ulcer     No past surgical history on file.  Social History   Tobacco Use   Smoking status: Former    Current packs/day: 0.00    Average packs/day: 2.0 packs/day for 15.0 years (30.0 ttl  pk-yrs)    Types: Cigarettes    Start date: 06/11/1971    Quit date: 06/11/1986    Years since quitting: 36.4   Smokeless tobacco: Never  Substance Use Topics   Alcohol use: Yes    Alcohol/week: 21.0 standard drinks of alcohol    Types: 21 Glasses of wine per week    Comment: 3-4 glasses with dinner   Drug use: No    Family History  Problem Relation Age of Onset   Cancer Father        lung   Diabetes Sister     No Known Allergies  Medication list has been reviewed and updated.  Current Outpatient Medications on File Prior to Visit  Medication Sig Dispense Refill   Cholecalciferol (VITAMIN D3) 1.25 MG (50000 UT) CAPS Take 1 weekly for 12 weeks 12 capsule 0   clonazePAM (KLONOPIN) 1 MG tablet Take 0.5 mg at bedtime as needed for sleep.  Ok to increase up to 2 mg gradually if needed 30 tablet 1   hydrochlorothiazide (MICROZIDE) 12.5  MG capsule TAKE 1 CAPSULE BY MOUTH EVERY DAY 90 capsule 1   LORazepam (ATIVAN) 0.5 MG tablet Take 1 tablet (0.5 mg total) by mouth at bedtime. 20 tablet 0   losartan (COZAAR) 100 MG tablet Take 1 tablet (100 mg total) by mouth daily. 90 tablet 1   MINOXIDIL, TOPICAL, 5 % SOLN Apply to affected areas twice daily. 120 mL 1   rosuvastatin (CRESTOR) 20 MG tablet TAKE 1 TABLET BY MOUTH EVERY DAY 90 tablet 1   No current facility-administered medications on file prior to visit.    Review of Systems:  As per HPI- otherwise negative.   Physical Examination: Vitals:   11/19/22 1021  BP: 136/82  Pulse: 85  Resp: 18  SpO2: 97%   Vitals:   11/19/22 1021  Weight: 192 lb 9.6 oz (87.4 kg)  Height: 5' 11.5" (1.816 m)   Body mass index is 26.49 kg/m. Ideal Body Weight: Weight in (lb) to have BMI = 25: 181.4  GEN: no acute distress.  Minimally overweight, looks well HEENT: Atraumatic, Normocephalic. Bilateral TM wnl, oropharynx normal.  PEERL,EOMI.   Ears and Nose: No external deformity. CV: RRR, No M/G/R. No JVD. No thrill. No extra heart sounds. PULM: CTA B, no wheezes, crackles, rhonchi. No retractions. No resp. distress. No accessory muscle use. ABD: S, NT, ND, +BS. No rebound. No HSM. EXTR: No c/c/e PSYCH: Normally interactive. Conversant.   EKG: SR, no ST elevation or depression.  Compared with EKG from 8/24 no significant change noted  Assessment and Plan: Hypertension, unspecified type - Plan: CBC, Comprehensive metabolic panel, EKG 12-Lead  Diarrhea, unspecified type - Plan: Comprehensive metabolic panel, TSH, Lipase  Grief reaction - Plan: buPROPion (WELLBUTRIN XL) 150 MG 24 hr tablet  Special screening, prostate cancer - Plan: PSA  DOE (dyspnea on exertion) - Plan: Exercise Tolerance Test, DG Chest 2 View  Screening, lipid - Plan: Lipid panel  Patient seen today for follow-up of a few concerns.  Blood pressure is under good control on current dose of losartan He is  dealing with some diarrhea recently.  Lab work is pending as above.  For now he is using Imodium as needed Follow-up on PSA Plan to obtain a chest film and treadmill test to further evaluate dyspnea on exertion  Joel Black is understandably dealing with depression symptoms following the death of his wife.  We decided to start Wellbutrin XR 150, he will let me  know how this works for him.  Advised we can increase to 300 after a week or so as desired.  I also encouraged him to consider a support group for bereavement or counseling, he will think about it  Signed Abbe Amsterdam, MD  Received labs and chest x-ray as below, message to patient  DG Chest 2 View  Result Date: 11/19/2022 CLINICAL DATA:  Shortness of breath for 3 4 months. Dyspnea on exertion. EXAM: CHEST - 2 VIEW COMPARISON:  Chest radiographs 08/27/2021, CT chest 10/02/2015 FINDINGS: Cardiac silhouette and mediastinal contours are within normal limits. The lungs are clear. Right greater left biapical pleural thickening/scarring is similar to prior. A right apical subpleural bulla is better seen on prior CT. No pleural effusion or pneumothorax. No acute skeletal abnormality. IMPRESSION: No active cardiopulmonary disease. Electronically Signed   By: Neita Garnet M.D.   On: 11/19/2022 13:42     Results for orders placed or performed in visit on 11/19/22  CBC  Result Value Ref Range   WBC 7.4 4.0 - 10.5 K/uL   RBC 4.59 4.22 - 5.81 Mil/uL   Platelets 267.0 150.0 - 400.0 K/uL   Hemoglobin 15.0 13.0 - 17.0 g/dL   HCT 19.1 47.8 - 29.5 %   MCV 100.4 (H) 78.0 - 100.0 fl   MCHC 32.6 30.0 - 36.0 g/dL   RDW 62.1 30.8 - 65.7 %  Comprehensive metabolic panel  Result Value Ref Range   Sodium 141 135 - 145 mEq/L   Potassium 4.5 3.5 - 5.1 mEq/L   Chloride 99 96 - 112 mEq/L   CO2 32 19 - 32 mEq/L   Glucose, Bld 89 70 - 99 mg/dL   BUN 17 6 - 23 mg/dL   Creatinine, Ser 8.46 0.40 - 1.50 mg/dL   Total Bilirubin 0.7 0.2 - 1.2 mg/dL   Alkaline  Phosphatase 65 39 - 117 U/L   AST 21 0 - 37 U/L   ALT 23 0 - 53 U/L   Total Protein 7.4 6.0 - 8.3 g/dL   Albumin 4.4 3.5 - 5.2 g/dL   GFR 96.29 (L) >52.84 mL/min   Calcium 10.4 8.4 - 10.5 mg/dL  TSH  Result Value Ref Range   TSH 3.97 0.35 - 5.50 uIU/mL  PSA  Result Value Ref Range   PSA 2.03 0.10 - 4.00 ng/mL  Lipid panel  Result Value Ref Range   Cholesterol 264 (H) 0 - 200 mg/dL   Triglycerides 132.4 (H) 0.0 - 149.0 mg/dL   HDL 40.10 >27.25 mg/dL   VLDL 36.6 (H) 0.0 - 44.0 mg/dL   LDL Cholesterol 347 (H) 0 - 99 mg/dL   Total CHOL/HDL Ratio 4    NonHDL 193.83   Lipase  Result Value Ref Range   Lipase 94.0 (H) 11.0 - 59.0 U/L

## 2022-11-25 DIAGNOSIS — Z1212 Encounter for screening for malignant neoplasm of rectum: Secondary | ICD-10-CM | POA: Diagnosis not present

## 2022-11-25 DIAGNOSIS — Z1211 Encounter for screening for malignant neoplasm of colon: Secondary | ICD-10-CM | POA: Diagnosis not present

## 2022-11-25 NOTE — Telephone Encounter (Signed)
Left message on cell voice mail for a scheduled ETT on 11/27/22 at 2:30.

## 2022-11-27 ENCOUNTER — Ambulatory Visit (HOSPITAL_COMMUNITY): Payer: Medicare HMO | Attending: Family Medicine

## 2022-11-27 ENCOUNTER — Other Ambulatory Visit: Payer: Self-pay | Admitting: Family Medicine

## 2022-11-27 DIAGNOSIS — R0609 Other forms of dyspnea: Secondary | ICD-10-CM | POA: Diagnosis not present

## 2022-11-27 DIAGNOSIS — I10 Essential (primary) hypertension: Secondary | ICD-10-CM

## 2022-11-28 ENCOUNTER — Other Ambulatory Visit: Payer: Self-pay

## 2022-11-28 ENCOUNTER — Emergency Department (HOSPITAL_COMMUNITY)
Admission: EM | Admit: 2022-11-28 | Discharge: 2022-11-28 | Disposition: A | Discharge status: Home / Self Care | Payer: Medicare HMO | Attending: Emergency Medicine | Admitting: Emergency Medicine

## 2022-11-28 ENCOUNTER — Encounter (HOSPITAL_COMMUNITY): Payer: Self-pay

## 2022-11-28 ENCOUNTER — Emergency Department (HOSPITAL_COMMUNITY): Payer: Medicare HMO

## 2022-11-28 DIAGNOSIS — R079 Chest pain, unspecified: Secondary | ICD-10-CM | POA: Diagnosis not present

## 2022-11-28 DIAGNOSIS — R000 Tachycardia, unspecified: Secondary | ICD-10-CM | POA: Diagnosis not present

## 2022-11-28 DIAGNOSIS — R002 Palpitations: Secondary | ICD-10-CM | POA: Insufficient documentation

## 2022-11-28 DIAGNOSIS — R0602 Shortness of breath: Secondary | ICD-10-CM | POA: Diagnosis not present

## 2022-11-28 DIAGNOSIS — Z79899 Other long term (current) drug therapy: Secondary | ICD-10-CM | POA: Insufficient documentation

## 2022-11-28 DIAGNOSIS — Z87891 Personal history of nicotine dependence: Secondary | ICD-10-CM | POA: Insufficient documentation

## 2022-11-28 DIAGNOSIS — R0789 Other chest pain: Secondary | ICD-10-CM | POA: Diagnosis not present

## 2022-11-28 DIAGNOSIS — I10 Essential (primary) hypertension: Secondary | ICD-10-CM | POA: Insufficient documentation

## 2022-11-28 DIAGNOSIS — J929 Pleural plaque without asbestos: Secondary | ICD-10-CM | POA: Diagnosis not present

## 2022-11-28 DIAGNOSIS — I771 Stricture of artery: Secondary | ICD-10-CM | POA: Diagnosis not present

## 2022-11-28 LAB — BASIC METABOLIC PANEL
Anion gap: 11 (ref 5–15)
BUN: 16 mg/dL (ref 8–23)
CO2: 23 mmol/L (ref 22–32)
Calcium: 9.6 mg/dL (ref 8.9–10.3)
Chloride: 103 mmol/L (ref 98–111)
Creatinine, Ser: 1.22 mg/dL (ref 0.61–1.24)
GFR, Estimated: >60 mL/min (ref >60)
Glucose, Bld: 105 mg/dL — ABNORMAL HIGH (ref 70–99)
Potassium: 3.6 mmol/L (ref 3.5–5.1)
Sodium: 137 mmol/L (ref 135–145)

## 2022-11-28 LAB — D-DIMER, QUANTITATIVE: D-Dimer, Quant: <0.27 ug{FEU}/mL (ref 0.00–0.50)

## 2022-11-28 LAB — CBC
HCT: 44.4 % (ref 39.0–52.0)
Hemoglobin: 15.3 g/dL (ref 13.0–17.0)
MCH: 33.4 pg (ref 26.0–34.0)
MCHC: 34.5 g/dL (ref 30.0–36.0)
MCV: 96.9 fL (ref 80.0–100.0)
Platelets: 276 10*3/uL (ref 150–400)
RBC: 4.58 MIL/uL (ref 4.22–5.81)
RDW: 13.2 % (ref 11.5–15.5)
WBC: 8.6 10*3/uL (ref 4.0–10.5)
nRBC: 0 % (ref 0.0–0.2)

## 2022-11-28 LAB — TROPONIN I (HIGH SENSITIVITY)
Troponin I (High Sensitivity): 9 ng/L (ref <18)
Troponin I (High Sensitivity): 11 ng/L (ref <18)

## 2022-11-28 NOTE — ED Provider Notes (Signed)
[facility] EMERGENCY DEPARTMENT AT [facility] Provider Note   CSN: [id number] Arrival date & time: 11/28/22  1442     History {Add pertinent medical, surgical, social history, OB history to HPI:1} Chief Complaint  Patient presents with   Chest Pain    Joel Black is a 67 y.o. male.  HPI     67yo male with history of hypertension, hyperlipidemia,  Pressure, acid reflux sensation Lasts a few minutes, today checked pulse with stethoscope and was 120s.Sounded irregular. Today had lightheadedness, chest pain began around 11AM Every now and again has to take deep breath to get lungs full, decreased breathing at night but not really short of breath No nausea or vomiting No leg pain or swelling, no recent long travel or surgeries Tend to feel it more when moving around the house   Saw Dr. Dallas Schimke 2 weeks ago, for fatigue, diarrhea and these chest pain sensations Had stress test yesterday  Today got up felt lightheaded  Breathing slow down at night, stopped breathing and wake up, wife used to tell him he snores, more frequent laying back Lack of initiative to get up in Am due to grief   No known hx of heart disease or blood clots Uncle on mother's side had MI in 7s    From [place]. Lost wife of 29 years in May after horseback riding accident  Smoked until 1988 3drinks/day, no other drugs  Past Medical History:  Diagnosis Date   Herniated cervical disc    History of kidney stones    Hypertension    MRSA (methicillin resistant Staphylococcus aureus)    Ulcer     Home Medications Prior to Admission medications   Medication Sig Start Date End Date Taking? Authorizing Provider  buPROPion (WELLBUTRIN XL) 150 MG 24 hr tablet Take 1 tablet (150 mg total) by mouth daily. Increase to 300 mg after 1 week as desired 11/19/22   Copland, Gwenlyn Found, MD  Cholecalciferol (VITAMIN D3) 1.25 MG (50000 UT) CAPS Take 1 weekly for 12 weeks 08/28/21   Copland, Gwenlyn Found, MD  clonazePAM (KLONOPIN) 1 MG tablet Take 0.5 mg at bedtime as needed for sleep.  Ok to increase up to 2 mg gradually if needed 06/17/22   Copland, Gwenlyn Found, MD  hydrochlorothiazide (MICROZIDE) 12.5 MG capsule TAKE 1 CAPSULE BY MOUTH EVERY DAY 06/22/22   Copland, Gwenlyn Found, MD  LORazepam (ATIVAN) 0.5 MG tablet Take 1 tablet (0.5 mg total) by mouth at bedtime. 06/12/22   Olive Bass, FNP  losartan (COZAAR) 100 MG tablet TAKE 1 TABLET BY MOUTH EVERY DAY 11/27/22   Copland, Gwenlyn Found, MD  MINOXIDIL, TOPICAL, 5 % SOLN Apply to affected areas twice daily. 11/11/21   Sharlene Dory, DO  rosuvastatin (CRESTOR) 20 MG tablet TAKE 1 TABLET BY MOUTH EVERY DAY 10/23/21   Copland, Gwenlyn Found, MD      Allergies    Patient has no known allergies.    Review of Systems   Review of Systems  Physical Exam Updated Vital Signs BP (!) 140/99 (BP Location: Right Arm)   Pulse (!) 120   Temp 98 F (36.7 C) (Oral)   Resp 18   Ht 5\' 11"  (1.803 m)   Wt 88.5 kg   SpO2 98%   BMI 27.20 kg/m  Physical Exam  ED Results / Procedures / Treatments   Labs (all labs ordered are listed, but only abnormal results are displayed) Labs Reviewed  BASIC METABOLIC PANEL -  Abnormal; Notable for the following components:      Result Value   Glucose, Bld 105 (*)    All other components within normal limits  CBC  TROPONIN I (HIGH SENSITIVITY)  TROPONIN I (HIGH SENSITIVITY)    EKG EKG Interpretation Date/Time:  Saturday November 28 2022 14:50:41 EST Ventricular Rate:  121 PR Interval:  150 QRS Duration:  74 QT Interval:  310 QTC Calculation: 440 R Axis:   51  Text Interpretation: Sinus tachycardia Nonspecific ST abnormality Abnormal ECG When compared with ECG of 22-Sep-2015 07:47, Since prior ECG, rate has increased,non specific st changes Confirmed by Alvira Monday ([id number]) on 11/28/2022 3:45:41 PM  Radiology DG Chest 2 View  Result Date: 11/28/2022 CLINICAL DATA:  Chest pain and shortness  of breath EXAM: CHEST - 2 VIEW COMPARISON:  11/19/2022 FINDINGS: Multiple leads and wires project over the chest on the frontal radiograph. The Midline trachea. Normal heart size. Tortuous thoracic aorta. No pleural effusion or pneumothorax. Right greater than left apical pleural thickening. Lungs are otherwise clear. IMPRESSION: No acute cardiopulmonary disease. Electronically Signed   By: Jeronimo Greaves M.D.   On: 11/28/2022 17:21    Procedures Procedures  {Document cardiac monitor, telemetry assessment procedure when appropriate:1}  Medications Ordered in ED Medications - No data to display  ED Course/ Medical Decision Making/ A&P   {   Click here for ABCD2, HEART and other calculatorsREFRESH Note before signing :1}                              Medical Decision Making Amount and/or Complexity of Data Reviewed Labs: ordered. Radiology: ordered.   ***  {Document critical care time when appropriate:1} {Document review of labs and clinical decision tools ie heart score, Chads2Vasc2 etc:1}  {Document your independent review of radiology images, and any outside records:1} {Document your discussion with family members, caretakers, and with consultants:1} {Document social determinants of health affecting pt's care:1} {Document your decision making why or why not admission, treatments were needed:1} Final Clinical Impression(s) / ED Diagnoses Final diagnoses:  None    Rx / DC Orders ED Discharge Orders     None

## 2022-11-28 NOTE — ED Notes (Signed)
Patient transported to X-ray 

## 2022-11-28 NOTE — ED Triage Notes (Signed)
Pt states he had a heart cath yesterday. Pt states that today he has chest "heaviness" and feels like his heart is racing and off beat. Pt feels short of breath and dizzy.

## 2022-11-30 ENCOUNTER — Encounter: Payer: Self-pay | Admitting: Family Medicine

## 2022-11-30 LAB — EXERCISE TOLERANCE TEST
Angina Index: 0
Estimated workload: 8.7
Exercise duration (min): 7 min
Exercise duration (sec): 6 s
MPHR: 151 {beats}/min
Peak HR: 173 {beats}/min
Percent HR: 114 %
RPE: 18
Rest HR: 110 {beats}/min

## 2022-12-01 ENCOUNTER — Ambulatory Visit: Payer: Medicare HMO | Attending: Cardiology | Admitting: Cardiology

## 2022-12-01 ENCOUNTER — Encounter: Payer: Self-pay | Admitting: Cardiology

## 2022-12-01 VITALS — BP 114/82 | HR 65 | Ht 71.5 in | Wt 187.0 lb

## 2022-12-01 DIAGNOSIS — I10 Essential (primary) hypertension: Secondary | ICD-10-CM

## 2022-12-01 DIAGNOSIS — R079 Chest pain, unspecified: Secondary | ICD-10-CM

## 2022-12-01 DIAGNOSIS — R9431 Abnormal electrocardiogram [ECG] [EKG]: Secondary | ICD-10-CM

## 2022-12-01 NOTE — Progress Notes (Signed)
Cardiology Office Note:  .   Date:  12/01/2022  ID:  Joel Black, DOB 07/03/1955, MRN [id number] PCP: Pearline Cables, MD  [facility] Providers Cardiologist:  None     History of Present Illness: .   Joel Black is a 67 y.o. 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 He described the pain as a pressure-like sensation, similar to acid reflux, which lasted for a few minutes. During this episode, he noted an irregular and rapid pulse, reaching rates in the 120s. He also reported feeling lightheaded and a need to take deep breaths. There were no associated symptoms of nausea, vomiting, or leg pain.  The patient has a history of smoking but quit in 1988. He has been under significant emotional stress following the tragic loss of his wife in a horse-riding accident in May. He has been using writing as a coping mechanism and is currently working on a book in tribute to his wife.  Previous medical investigations include a chest x-ray and delta troponins, both of which were negative. EKGs from the emergency department showed sinus tachycardia at 112 beats per minute and 121 beats per minute on two separate occasions. An EKG from 2017 showed a normal sinus rhythm at 67 beats per minute. The patient also underwent a nuclear stress test in 2017, which was normal with an ejection fraction (EF) of 49%.  The patient's recent episode of chest pain and palpitations has raised concerns about his cardiac health. However, he believes that his symptoms may be due to anxiety related to his recent personal loss. He has been less active in the past two days due to fear of exacerbating his symptoms.            Studies Reviewed: .        Results LABS Delta troponins: Negative (11/28/2022)  RADIOLOGY Chest x-ray: No  evidence of abnormality (11/28/2022)  DIAGNOSTIC EKG: Sinus tachycardia 112 bpm, no evidence of atrial fibrillation or flutter (11/28/2022) EKG: Sinus tachycardia 121 bpm with nonspecific STT wave changes (11/28/2022) EKG: Sinus tachycardia, normal (12/01/2022) Treadmill stress test: 7 minutes, appropriate heart rate increase, subtle EKG changes (11/28/2022)  Risk Assessment/Calculations:            Physical Exam:   VS:  BP 114/82   Pulse 65   Ht 5' 11.5" (1.816 m)   Wt 187 lb (84.8 kg)   SpO2 95%   BMI 25.72 kg/m    Wt Readings from Last 3 Encounters:  12/01/22 187 lb (84.8 kg)  11/28/22 195 lb (88.5 kg)  11/19/22 192 lb 9.6 oz (87.4 kg)    GEN: Well nourished, well developed in no acute distress NECK: No JVD; No carotid bruits CARDIAC: Tachy, reg, no murmurs, no rubs, no gallops RESPIRATORY:  Clear to auscultation without rales, wheezing or rhonchi  ABDOMEN: Soft, non-tender, non-distended EXTREMITIES:  No edema; No deformity   ASSESSMENT AND PLAN: .    Assessment and Plan    Sinus Tachycardia Recent episode with heart rate in the 120s, presenting as pressure-like chest pain and palpitations. EKG showed sinus tachycardia at 112 bpm, no atrial fibrillation or flutter. Negative delta troponins, normal chest X-ray. Emotional stress from recent spouse loss likely contributing. Discussed CT scan for coronary assessment if heart rate remains controlled. Echocardiogram to assess heart function and rule  out structural abnormalities. - Order echocardiogram - Consider CT scan however heart rate needs better controlled - Advise on stress management and avoidance of strenuous activities until further evaluation  Emotional Stress/Anxiety Significant stress and anxiety following spouse's death, contributing to cardiac symptoms. Reports lightheadedness and deep breaths during tachycardia episodes. Discussed stress management and potential counseling benefits. - Discuss stress  management techniques and consider mental health referral - Advise on relaxation techniques and regular physical activity as tolerated  General Health Maintenance Remote smoking history, quit in 1988. No current smoking or other significant chronic conditions discussed. - Encourage continued smoking abstinence - Advise on maintaining a healthy lifestyle, including regular exercise, balanced diet, and routine check-ups  Follow-up - Schedule follow-up after echocardiogram results - Provide summary page with instructions and contact information for questions or concerns.               Signed, Donato Schultz, MD

## 2022-12-01 NOTE — Patient Instructions (Signed)
Medication Instructions:  The current medical regimen is effective;  continue present plan and medications.  *If you need a refill on your cardiac medications before your next appointment, please call your pharmacy*   Testing/Procedures: Your physician has requested that you have an echocardiogram. Echocardiography is a painless test that uses sound waves to create images of your heart. It provides your doctor with information about the size and shape of your heart and how well your heart's chambers and valves are working. This procedure takes approximately one hour. There are no restrictions for this procedure. Please do NOT wear cologne, perfume, aftershave, or lotions (deodorant is allowed). Please arrive 15 minutes prior to your appointment time.  Please note: We ask at that you not bring children with you during ultrasound (echo/ vascular) testing. Due to room size and safety concerns, children are not allowed in the ultrasound rooms during exams. Our front office staff cannot provide observation of children in our lobby area while testing is being conducted. An adult accompanying a patient to their appointment will only be allowed in the ultrasound room at the discretion of the ultrasound technician under special circumstances. We apologize for any inconvenience.   Follow-Up: At [facility], you and your health needs are our priority.  As part of our continuing mission to provide you with exceptional heart care, we have created designated Provider Care Teams.  These Care Teams include your primary Cardiologist (physician) and Advanced Practice Providers (APPs -  Physician Assistants and Nurse Practitioners) who all work together to provide you with the care you need, when you need it.  We recommend signing up for the patient portal called "MyChart".  Sign up information is provided on this After Visit Summary.  MyChart is used to connect with patients for Virtual Visits  (Telemedicine).  Patients are able to view lab/test results, encounter notes, upcoming appointments, etc.  Non-urgent messages can be sent to your provider as well.   To learn more about what you can do with MyChart, go to [email or web address].    Your next appointment:   Will be based on the results of the above testing.

## 2022-12-03 ENCOUNTER — Ambulatory Visit (HOSPITAL_COMMUNITY): Payer: Medicare HMO | Attending: Cardiology

## 2022-12-03 DIAGNOSIS — R079 Chest pain, unspecified: Secondary | ICD-10-CM | POA: Diagnosis not present

## 2022-12-03 LAB — ECHOCARDIOGRAM COMPLETE
Area-P 1/2: 2.76 cm2
S' Lateral: 2.2 cm

## 2022-12-04 ENCOUNTER — Telehealth: Payer: Self-pay | Admitting: Family Medicine

## 2022-12-04 NOTE — Telephone Encounter (Signed)
Called patient as I received an alert he had not seen his recent MyChart visit.  He does plan to check his notes, I made him a lab appointment for Monday to recheck lipase

## 2022-12-07 ENCOUNTER — Other Ambulatory Visit (INDEPENDENT_AMBULATORY_CARE_PROVIDER_SITE_OTHER): Payer: Medicare HMO

## 2022-12-07 DIAGNOSIS — R748 Abnormal levels of other serum enzymes: Secondary | ICD-10-CM

## 2022-12-07 DIAGNOSIS — I10 Essential (primary) hypertension: Secondary | ICD-10-CM | POA: Diagnosis not present

## 2022-12-08 ENCOUNTER — Encounter: Payer: Self-pay | Admitting: Family Medicine

## 2022-12-08 DIAGNOSIS — R748 Abnormal levels of other serum enzymes: Secondary | ICD-10-CM

## 2022-12-08 LAB — BASIC METABOLIC PANEL
BUN: 15 mg/dL (ref 6–23)
CO2: 26 meq/L (ref 19–32)
Calcium: 9.4 mg/dL (ref 8.4–10.5)
Chloride: 105 meq/L (ref 96–112)
Creatinine, Ser: 1.14 mg/dL (ref 0.40–1.50)
GFR: 65.64 mL/min (ref >60.00)
Glucose, Bld: 91 mg/dL (ref 70–99)
Potassium: 3.8 meq/L (ref 3.5–5.1)
Sodium: 142 meq/L (ref 135–145)

## 2022-12-08 LAB — LIPASE: Lipase: 68 U/L — ABNORMAL HIGH (ref 11.0–59.0)

## 2022-12-09 ENCOUNTER — Encounter: Payer: Self-pay | Admitting: Family Medicine

## 2022-12-09 LAB — COLOGUARD: COLOGUARD: NEGATIVE

## 2022-12-11 ENCOUNTER — Telehealth (HOSPITAL_BASED_OUTPATIENT_CLINIC_OR_DEPARTMENT_OTHER): Payer: Self-pay | Admitting: Family Medicine

## 2022-12-15 ENCOUNTER — Ambulatory Visit (HOSPITAL_BASED_OUTPATIENT_CLINIC_OR_DEPARTMENT_OTHER)
Admission: RE | Admit: 2022-12-15 | Discharge: 2022-12-15 | Disposition: A | Discharge status: Home / Self Care | Payer: Medicare HMO | Source: Ambulatory Visit | Attending: Family Medicine | Admitting: Family Medicine

## 2022-12-15 ENCOUNTER — Telehealth: Payer: Self-pay

## 2022-12-15 DIAGNOSIS — K449 Diaphragmatic hernia without obstruction or gangrene: Secondary | ICD-10-CM | POA: Diagnosis not present

## 2022-12-15 DIAGNOSIS — R748 Abnormal levels of other serum enzymes: Secondary | ICD-10-CM | POA: Diagnosis not present

## 2022-12-15 DIAGNOSIS — K5730 Diverticulosis of large intestine without perforation or abscess without bleeding: Secondary | ICD-10-CM | POA: Diagnosis not present

## 2022-12-15 MED ORDER — IOHEXOL 300 MG/ML  SOLN
100.0000 mL | Freq: Once | INTRAMUSCULAR | Status: AC | PRN
  Administered 2022-12-15: 100 mL via INTRAVENOUS

## 2022-12-15 NOTE — Telephone Encounter (Signed)
FYI:   Approval number: [id number] Dates approved: 12/09/22 - 06/07/2023

## 2022-12-27 ENCOUNTER — Encounter: Payer: Self-pay | Admitting: Family Medicine

## 2022-12-27 ENCOUNTER — Other Ambulatory Visit: Payer: Self-pay | Admitting: Family Medicine

## 2022-12-27 DIAGNOSIS — R748 Abnormal levels of other serum enzymes: Secondary | ICD-10-CM

## 2022-12-30 ENCOUNTER — Other Ambulatory Visit: Payer: Self-pay | Admitting: Family Medicine

## 2022-12-30 DIAGNOSIS — I10 Essential (primary) hypertension: Secondary | ICD-10-CM

## 2023-01-01 ENCOUNTER — Other Ambulatory Visit (INDEPENDENT_AMBULATORY_CARE_PROVIDER_SITE_OTHER): Payer: Medicare HMO

## 2023-01-01 DIAGNOSIS — R748 Abnormal levels of other serum enzymes: Secondary | ICD-10-CM | POA: Diagnosis not present

## 2023-01-01 LAB — LIPASE: Lipase: 408 U/L — ABNORMAL HIGH (ref 11.0–59.0)

## 2023-01-03 ENCOUNTER — Encounter: Payer: Self-pay | Admitting: Family Medicine

## 2023-01-03 DIAGNOSIS — K860 Alcohol-induced chronic pancreatitis: Secondary | ICD-10-CM

## 2023-01-03 DIAGNOSIS — Z1211 Encounter for screening for malignant neoplasm of colon: Secondary | ICD-10-CM

## 2023-01-03 DIAGNOSIS — K222 Esophageal obstruction: Secondary | ICD-10-CM

## 2023-01-04 ENCOUNTER — Encounter: Payer: Self-pay | Admitting: Pediatrics

## 2023-01-23 ENCOUNTER — Encounter: Payer: Self-pay | Admitting: Family Medicine

## 2023-01-23 DIAGNOSIS — R197 Diarrhea, unspecified: Secondary | ICD-10-CM

## 2023-01-28 ENCOUNTER — Encounter: Payer: Self-pay | Admitting: Family Medicine

## 2023-01-28 ENCOUNTER — Other Ambulatory Visit (INDEPENDENT_AMBULATORY_CARE_PROVIDER_SITE_OTHER): Payer: Medicare HMO

## 2023-01-28 DIAGNOSIS — R197 Diarrhea, unspecified: Secondary | ICD-10-CM | POA: Diagnosis not present

## 2023-01-28 LAB — COMPREHENSIVE METABOLIC PANEL
ALT: 17 U/L (ref 0–53)
AST: 16 U/L (ref 0–37)
Albumin: 4.2 g/dL (ref 3.5–5.2)
Alkaline Phosphatase: 59 U/L (ref 39–117)
BUN: 17 mg/dL (ref 6–23)
CO2: 26 meq/L (ref 19–32)
Calcium: 9.1 mg/dL (ref 8.4–10.5)
Chloride: 102 meq/L (ref 96–112)
Creatinine, Ser: 1.33 mg/dL (ref 0.40–1.50)
GFR: 54.5 mL/min — ABNORMAL LOW (ref >60.00)
Glucose, Bld: 82 mg/dL (ref 70–99)
Potassium: 3.9 meq/L (ref 3.5–5.1)
Sodium: 140 meq/L (ref 135–145)
Total Bilirubin: 0.4 mg/dL (ref 0.2–1.2)
Total Protein: 6.9 g/dL (ref 6.0–8.3)

## 2023-01-28 LAB — LIPASE: Lipase: 224 U/L — ABNORMAL HIGH (ref 11.0–59.0)

## 2023-01-28 LAB — FOLATE: Folate: 7.2 ng/mL (ref >5.9)

## 2023-01-28 LAB — VITAMIN D 25 HYDROXY (VIT D DEFICIENCY, FRACTURES): VITD: 32.22 ng/mL (ref 30.00–100.00)

## 2023-01-28 LAB — VITAMIN B12: Vitamin B-12: 235 pg/mL (ref 211–911)

## 2023-01-30 LAB — RETICULIN ANTIBODIES, IGA W TITER: Reticulin Ab, IgA: NEGATIVE {titer} (ref <2.5)

## 2023-02-02 DIAGNOSIS — H40013 Open angle with borderline findings, low risk, bilateral: Secondary | ICD-10-CM | POA: Diagnosis not present

## 2023-02-03 LAB — VITAMIN A: Vitamin A (Retinoic Acid): 57 ug/dL (ref 38–98)

## 2023-02-03 LAB — GLIADIN ANTIBODIES, SERUM
Gliadin IgA: 1 U/mL
Gliadin IgG: <1 U/mL

## 2023-02-03 LAB — VITAMIN E
Gamma-Tocopherol (Vit E): 2.4 mg/L (ref <4.4)
Vitamin E (Alpha Tocopherol): 13.4 mg/L (ref 5.7–19.9)

## 2023-02-03 LAB — TISSUE TRANSGLUTAMINASE, IGA: (tTG) Ab, IgA: <1 U/mL

## 2023-02-04 ENCOUNTER — Encounter: Payer: Self-pay | Admitting: Family Medicine

## 2023-03-12 DIAGNOSIS — Z008 Encounter for other general examination: Secondary | ICD-10-CM | POA: Diagnosis not present

## 2023-03-18 NOTE — Progress Notes (Signed)
 [facility] Initial Consultation   Referring Provider Copland, Gwenlyn Found, MD [address] [place],  [place] [address]  Primary Care Provider Copland, Gwenlyn Found, MD  Patient Profile: Joel Black is a 68 y.o. male who is seen in consultation in the [facility] at the request of Dr. Patsy Lager for evaluation and management of the problem(s) noted below.  Problem List: Hyperlipasemia Chronic diarrhea Heartburn, dysphagia Left-sided colonic diverticulosis on imaging Colon cancer screening -Cologuard negative 11/2022   History of Present Illness   Joel Black is a 68 y.o. male with a history of HTN, vertigo, spinal stenosis, Rocky [place] spotted fever and lung nodule who is referred to the gastroenterology office for evaluation and management of hyperlipasemia, diarrhea, dysphagia, heartburn and colon cancer screening.  Chronic Diarrhea -- Longstanding history of loose stool dating back many years that seems to be worsening -- Having 6-7 loose, watery bowel movements a day with associated gas -- Endorses nocturnal stools up to 3 times a night -- Experiences fecal urgency and has only had 1 episode of incontinence in the remote past -- Denies significant abdominal pain or cramping -- No melena or hematochezia -- No previous colonoscopy -- No previous evaluation for diarrhea  -- Reports a family history of celiac disease in sister and maternal aunt -- Celiac antibodies drawn 01/2003 negative but no total serum IgA  -- Reports previous history of tick bites and [place] spotted fever -no documented history of alpha gal allergy  Hyperlipasemia -- Has had a persistently elevated lipase over the last 4 months ranging 94-408 -- No documented history of pancreatitis -- CTAP 11/2012 - normal pancreas, left colon diverticulosis without diverticulitis -- Had been consuming larger quantities of alcohol after the passing of his wife  05/2022-has now cut back -- No family history of pancreatic disease or pancreatic cancer  Heartburn/dysphagia -- Endorses symptoms of heartburn with mild regurgitation -- Also noting a sensation of "food stopping" at the xiphoid process -- No vomiting or significant regurgitation -- At times feels uncomfortable when eating trying to get food down -- Taking Mylanta with benefit -- No previous EGD -- No family history of esophageal cancer  Colon cancer screening -- Cologuard 2024 negative -- No known family history of colorectal cancer or polyps  Last colonoscopy: No colonoscopy-Cologuard - 11/2022 Last endoscopy: None  Last Abd CT/CTE/MRE: CTAP 12/2022 -normal pancreas, left colon diverticulosis without diverticulitis  GI Review of Symptoms Significant for diarrhea, fecal urgency, heartburn, dysphagia. Otherwise negative.  General Review of Systems  Review of systems is significant for the pertinent positives and negatives as listed per the HPI.  Full ROS is otherwise negative.  Past Medical History   Past Medical History:  Diagnosis Date   Chronic pancreatitis (HCC)    Herniated cervical disc    History of kidney stones    Hypertension    MRSA (methicillin resistant Staphylococcus aureus)    Ulcer   Rocky mounted spotted fever Hyperlipidemia   Past Surgical History  History reviewed. No pertinent surgical history.   Allergies and Medications  No Known Allergies   Current Meds  Medication Sig   aluminum-magnesium hydroxide 200-200 MG/5ML suspension Take by mouth every 6 (six) hours as needed for indigestion.   Cholecalciferol (VITAMIN D3) 1.25 MG (50000 UT) CAPS Take 1 weekly for 12 weeks (Patient taking differently: as needed.)   hydrochlorothiazide (MICROZIDE) 12.5 MG capsule TAKE 1 CAPSULE BY MOUTH EVERY DAY   losartan (COZAAR) 100 MG tablet  TAKE 1 TABLET BY MOUTH EVERY DAY   [EXPIRED] PEG 3350-KCl-NaCl-NaSulf-MgSul (SUFLAVE) 178.7 g SOLR Take 1 kit by mouth  once for 1 dose.     Family History   Family History  Problem Relation Age of Onset   Cancer Father        lung   Diabetes Sister    Colon cancer Neg Hx    Stomach cancer Neg Hx      Social History   Social History   Tobacco Use   Smoking status: Former    Current packs/day: 0.00    Average packs/day: 2.0 packs/day for 15.0 years (30.0 ttl pk-yrs)    Types: Cigarettes    Start date: 06/11/1971    Quit date: 06/11/1986    Years since quitting: 36.7   Smokeless tobacco: Never  Vaping Use   Vaping status: Never Used  Substance Use Topics   Alcohol use: Yes    Alcohol/week: 21.0 standard drinks of alcohol    Types: 21 Glasses of wine per week    Comment: occasional glass of wine   Drug use: No   Joel Black reports that he quit smoking about 36 years ago. His smoking use included cigarettes. He started smoking about 51 years ago. He has a 30 pack-year smoking history. He has never used smokeless tobacco. He reports current alcohol use of about 21.0 standard drinks of alcohol per week. He reports that he does not use drugs.  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 Weight: 185 lb (83.9 kg)  General: Well developed, well nourished, no acute distress Head: Normocephalic and atraumatic Eyes: Sclerae anicteric, EOMI Lungs: Clear throughout to auscultation Heart: Regular rate and rhythm; No murmurs, rubs or bruits Abdomen: Soft, mild tenderness to palpation in the right lower quadrant and non distended. No masses, hepatosplenomegaly or hernias noted. Normal Bowel sounds Rectal:Deferred Musculoskeletal: Symmetrical with no gross deformities   Review of Data  The following data was reviewed at the time of this encounter:  Laboratory Studies      Latest Ref Rng & Units 03/19/2023     2:50 PM 11/28/2022    3:20 PM 11/19/2022   11:14 AM  CBC  WBC 4.0 - 10.5 K/uL 7.1  8.6  7.4   Hemoglobin 13.0 - 17.0 g/dL 16.1  09.6  04.5   Hematocrit 39.0 - 52.0 % 45.0  44.4  46.1   Platelets 150.0 - 400.0 K/uL 301.0  276  267.0     Lab Results  Component Value Date   LIPASE 106.0 (H) 03/19/2023      Latest Ref Rng & Units 03/19/2023    2:50 PM 01/28/2023   10:00 AM 12/07/2022   10:47 AM  CMP  Glucose 70 - 99 mg/dL 96  82  91   BUN 6 - 23 mg/dL 19  17  15    Creatinine 0.40 - 1.50 mg/dL 4.09  8.11  9.14   Sodium 135 - 145 mEq/L 138  140  142   Potassium 3.5 - 5.1 mEq/L 3.7  3.9  3.8   Chloride 96 - 112 mEq/L 102  102  105   CO2 19 - 32 mEq/L 27  26  26    Calcium 8.4 - 10.5 mg/dL 9.5  9.1  9.4  Total Protein 6.0 - 8.3 g/dL 7.7  6.9    Total Bilirubin 0.2 - 1.2 mg/dL 0.8  0.4    Alkaline Phos 39 - 117 U/L 54  59    AST 0 - 37 U/L 17  16    ALT 0 - 53 U/L 19  17     Lab Results  Component Value Date   TSH 3.97 11/19/2022    Labs 01/28/2023 Vitamin EE 13.4 Vitamin D  32 Vitamin A   57 Vitamin B12   235 Folate 7.2 TTG IgA less than 1  11/2022  -Cologuard negative  Imaging Studies  CTAP 12/2022 Normal pancreas on CT. Mild left colonic diverticulosis, without evidence of diverticulitis.  GI Procedures and Studies  None   Clinical Impression  It is my clinical impression that Joel Black is a 68 y.o. male with;  Hyperlipasemia Chronic diarrhea Heartburn, dysphagia Left-sided colonic diverticulosis on imaging Colon cancer screening -Cologuard negative 11/2022  Joel Black is referred to the gastroenterology office for evaluation of multiple gastrointestinal issues as outlined above.  With regard to his chronic diarrhea we reviewed that the differential diagnosis could include: Celiac disease, known celiac gluten sensitivity, carbohydrate intolerance, alpha gal allergy, microscopic colitis, bile acid diarrhea, pancreatic insufficiency or SIBO.  While functional  diarrhea remains a possibility the fact that he is having nocturnal bowel movements would be less consistent with that diagnosis.  I have recommended rounding out his laboratory evaluation today for celiac disease and alpha gal allergy.  We will also perform stool studies.  If any etiology of his diarrhea cannot be elucidated through noninvasive testing he is amenable to undergoing colonoscopy with biopsies for microscopic colitis.  The etiology of his elevated lipase could be related to recent alcohol ingestion although it has not normalized since he has decreased alcohol intake.  His pancreas was normal on imaging -no findings to suggest chronic pancreatitis.  As above we are rounding out evaluation for celiac disease today which can be associated with hyperlipasemia.  He is endorsing new symptoms of heartburn and dysphagia that are being managed with Mylanta.  He is amenable to undergoing upper endoscopy for further evaluation of this.  He is up-to-date regarding colorectal cancer screening with a negative Cologuard in 2024.   Plan  Labs today: CBC, CMP, total IgA, lipase, alpha gal panel Stool studies: Bacterial culture, C. difficile, O&P, fecal fat, pancreatic elastase Schedule EGD for evaluation of dysphagia heartburn and colonoscopy for evaluation of chronic diarrhea -advised that if we elucidate an etiology of chronic diarrhea on noninvasive testing we can consider canceling colonoscopy May continue using Mylanta as needed for symptoms of heartburn pending results of EGD  Planned Follow Up Return in about 3 months (around 06/16/2023).  The patient or caregiver verbalized understanding of the material covered, with no barriers to understanding. All questions were answered. Patient or caregiver is agreeable with the plan outlined above.    It was a pleasure to see Joel Black.  If you have any questions or concerns regarding this evaluation, do not hesitate to contact me.  Maren Beach,  MD [facility]

## 2023-03-19 ENCOUNTER — Encounter: Payer: Self-pay | Admitting: Pediatrics

## 2023-03-19 ENCOUNTER — Other Ambulatory Visit (INDEPENDENT_AMBULATORY_CARE_PROVIDER_SITE_OTHER): Payer: Medicare HMO

## 2023-03-19 ENCOUNTER — Ambulatory Visit (INDEPENDENT_AMBULATORY_CARE_PROVIDER_SITE_OTHER): Payer: Medicare HMO | Admitting: Pediatrics

## 2023-03-19 VITALS — BP 130/72 | HR 79 | Ht 71.5 in | Wt 185.0 lb

## 2023-03-19 DIAGNOSIS — R748 Abnormal levels of other serum enzymes: Secondary | ICD-10-CM | POA: Diagnosis not present

## 2023-03-19 DIAGNOSIS — K5730 Diverticulosis of large intestine without perforation or abscess without bleeding: Secondary | ICD-10-CM

## 2023-03-19 DIAGNOSIS — K529 Noninfective gastroenteritis and colitis, unspecified: Secondary | ICD-10-CM

## 2023-03-19 DIAGNOSIS — R12 Heartburn: Secondary | ICD-10-CM

## 2023-03-19 DIAGNOSIS — R197 Diarrhea, unspecified: Secondary | ICD-10-CM

## 2023-03-19 DIAGNOSIS — R1310 Dysphagia, unspecified: Secondary | ICD-10-CM | POA: Diagnosis not present

## 2023-03-19 LAB — CBC WITH DIFFERENTIAL/PLATELET
Basophils Absolute: 0.1 10*3/uL (ref 0.0–0.1)
Basophils Relative: 0.8 % (ref 0.0–3.0)
Eosinophils Absolute: 0.3 10*3/uL (ref 0.0–0.7)
Eosinophils Relative: 4.6 % (ref 0.0–5.0)
HCT: 45 % (ref 39.0–52.0)
Hemoglobin: 15.6 g/dL (ref 13.0–17.0)
Lymphocytes Relative: 25.4 % (ref 12.0–46.0)
Lymphs Abs: 1.8 10*3/uL (ref 0.7–4.0)
MCHC: 34.7 g/dL (ref 30.0–36.0)
MCV: 97.1 fL (ref 78.0–100.0)
Monocytes Absolute: 1 10*3/uL (ref 0.1–1.0)
Monocytes Relative: 14.4 % — ABNORMAL HIGH (ref 3.0–12.0)
Neutro Abs: 3.9 10*3/uL (ref 1.4–7.7)
Neutrophils Relative %: 54.8 % (ref 43.0–77.0)
Platelets: 301 10*3/uL (ref 150.0–400.0)
RBC: 4.64 Mil/uL (ref 4.22–5.81)
RDW: 13 % (ref 11.5–15.5)
WBC: 7.1 10*3/uL (ref 4.0–10.5)

## 2023-03-19 LAB — COMPREHENSIVE METABOLIC PANEL
ALT: 19 U/L (ref 0–53)
AST: 17 U/L (ref 0–37)
Albumin: 4.4 g/dL (ref 3.5–5.2)
Alkaline Phosphatase: 54 U/L (ref 39–117)
BUN: 19 mg/dL (ref 6–23)
CO2: 27 meq/L (ref 19–32)
Calcium: 9.5 mg/dL (ref 8.4–10.5)
Chloride: 102 meq/L (ref 96–112)
Creatinine, Ser: 1.32 mg/dL (ref 0.40–1.50)
GFR: 54.94 mL/min — ABNORMAL LOW (ref >60.00)
Glucose, Bld: 96 mg/dL (ref 70–99)
Potassium: 3.7 meq/L (ref 3.5–5.1)
Sodium: 138 meq/L (ref 135–145)
Total Bilirubin: 0.8 mg/dL (ref 0.2–1.2)
Total Protein: 7.7 g/dL (ref 6.0–8.3)

## 2023-03-19 LAB — LIPASE: Lipase: 106 U/L — ABNORMAL HIGH (ref 11.0–59.0)

## 2023-03-19 MED ORDER — SUFLAVE 178.7 G PO SOLR: 1.0000 | Freq: Once | ORAL | 0 refills | Status: AC

## 2023-03-19 NOTE — Patient Instructions (Signed)
 Your provider has requested that you go to the basement level for lab work before leaving today. Press "B" on the elevator. The lab is located at the first door on the left as you exit the elevator.   You have been scheduled for an endoscopy and colonoscopy. Please follow the written instructions given to you at your visit today.  If you use inhalers (even only as needed), please bring them with you on the day of your procedure.  DO NOT TAKE 7 DAYS PRIOR TO TEST- Trulicity (dulaglutide) Ozempic, Wegovy (semaglutide) Mounjaro (tirzepatide) Bydureon Bcise (exanatide extended release)  DO NOT TAKE 1 DAY PRIOR TO YOUR TEST Rybelsus (semaglutide) Adlyxin (lixisenatide) Victoza (liraglutide) Byetta (exanatide) ___________________________________________________________________________  Joel Black will receive your bowel preparation through [facility], which ensures the lowest copay and home delivery, with outreach via text or call from an [phone] number. Please respond promptly to avoid rescheduling of your procedure. If you are interested in alternative options or have any questions regarding your prep, please contact them at [phone] ____________________________________________________________________________  Your Provider Has Sent Your Bowel Prep Regimen To [facility]   [facility] will contact you to verify your information and collect your copay, if applicable. Enjoy the comfort of your home while your prescription is mailed to you, FREE of any shipping charges.   [facility] accepts all major insurance benefits and applies discounts & coupons.  Have additional questions?   Chat: [email or web address] Call: [phone] Email: [email or web address] NCPDP: [id number]  How will [facility] contact you?  With a Welcome phone call,  a Welcome text and a checkout link in text form.  Texts you receive from [phone] Are NOT Spam.  *To set up delivery, you must complete the  checkout process via link or speak to one of the patient care representatives. If [facility] is unable to reach you, your prescription may be delayed.  To avoid long hold times on the phone, you may also utilize the secure chat feature on the [facility] website to request that they call you back for transaction completion or to expedite your concerns.

## 2023-03-23 ENCOUNTER — Encounter: Payer: Self-pay | Admitting: Pediatrics

## 2023-03-23 ENCOUNTER — Other Ambulatory Visit

## 2023-03-23 DIAGNOSIS — R197 Diarrhea, unspecified: Secondary | ICD-10-CM

## 2023-03-24 LAB — ALPHA-GAL PANEL
Allergen, Mutton, f88: 0.17 kU/L — ABNORMAL HIGH
Allergen, Pork, f26: 0.71 kU/L — ABNORMAL HIGH
Beef: 1.27 kU/L — ABNORMAL HIGH
CLASS: 2
CLASS: 2
GALACTOSE-ALPHA-1,3-GALACTOSE IGE*: 1.75 kU/L — ABNORMAL HIGH (ref <0.10)

## 2023-03-24 LAB — IGA: Immunoglobulin A: 618 mg/dL — ABNORMAL HIGH (ref 70–320)

## 2023-03-24 LAB — INTERPRETATION:

## 2023-03-25 ENCOUNTER — Encounter: Payer: Self-pay | Admitting: Pediatrics

## 2023-03-25 LAB — SALMONELLA/SHIGELLA CULT, CAMPY EIA AND SHIGA TOXIN RFL ECOLI
MICRO NUMBER: 16156522
MICRO NUMBER:: 16156523
MICRO NUMBER:: 16156524
Result:: NOT DETECTED
SHIGA RESULT:: NOT DETECTED
SPECIMEN QUALITY: ADEQUATE
SPECIMEN QUALITY:: ADEQUATE
SPECIMEN QUALITY:: ADEQUATE

## 2023-03-25 LAB — CLOSTRIDIUM DIFFICILE BY PCR: Toxigenic C. Difficile by PCR: NEGATIVE

## 2023-03-25 LAB — OVA AND PARASITE EXAMINATION
CONCENTRATE RESULT:: NONE SEEN
MICRO NUMBER:: 16156521
SPECIMEN QUALITY:: ADEQUATE
TRICHROME RESULT:: NONE SEEN

## 2023-03-30 LAB — PANCREATIC ELASTASE, FECAL: Pancreatic Elastase-1, Stool: 53 ug/g — ABNORMAL LOW (ref >200)

## 2023-04-01 ENCOUNTER — Encounter: Payer: Self-pay | Admitting: Pediatrics

## 2023-04-01 DIAGNOSIS — R197 Diarrhea, unspecified: Secondary | ICD-10-CM

## 2023-04-01 DIAGNOSIS — Z91018 Allergy to other foods: Secondary | ICD-10-CM

## 2023-04-06 ENCOUNTER — Other Ambulatory Visit

## 2023-04-06 DIAGNOSIS — R197 Diarrhea, unspecified: Secondary | ICD-10-CM | POA: Diagnosis not present

## 2023-04-09 LAB — FECAL FAT, QUALITATIVE
Fat Qual Neutral, Stl: NORMAL
Fat Qual Total, Stl: NORMAL

## 2023-04-12 ENCOUNTER — Encounter: Payer: Self-pay | Admitting: Pediatrics

## 2023-04-27 ENCOUNTER — Encounter: Attending: Pediatrics | Admitting: Skilled Nursing Facility1

## 2023-04-27 ENCOUNTER — Encounter: Payer: Self-pay | Admitting: Skilled Nursing Facility1

## 2023-04-27 ENCOUNTER — Telehealth: Payer: Self-pay | Admitting: Pediatrics

## 2023-04-27 DIAGNOSIS — E639 Nutritional deficiency, unspecified: Secondary | ICD-10-CM | POA: Insufficient documentation

## 2023-04-27 DIAGNOSIS — Z91014 Allergy to mammalian meats: Secondary | ICD-10-CM | POA: Diagnosis not present

## 2023-04-27 DIAGNOSIS — Z713 Dietary counseling and surveillance: Secondary | ICD-10-CM | POA: Insufficient documentation

## 2023-04-27 DIAGNOSIS — Z91018 Allergy to other foods: Secondary | ICD-10-CM | POA: Diagnosis not present

## 2023-04-27 NOTE — Telephone Encounter (Signed)
 Patient called stated he met with a nutritionist and they discovered what his issues was, he would like to discuss with a nurse because he thinks he should cancel the procedures.

## 2023-04-27 NOTE — Progress Notes (Signed)
 Medical Nutrition Therapy  Appointment Start time:  2:46  Appointment End time:  3:35  Primary concerns today: Diarrhea  Referral diagnosis: Alpha Gal   NUTRITION ASSESSMENT    Clinical Medical Hx:  Medications: see list Labs:  Notable Signs/Symptoms: dairrhea  Lifestyle & Dietary Hx  Pt complains of diarrhea daily. Pt states his sister does have celiac. Pt states eh as unaware he had an Alpha Gal allergy.   Estimated daily fluid intake:  oz Supplements:  Sleep:  Stress / self-care:  Current average weekly physical activity:   24-Hr Dietary Recall: pt spent appt discussing his life First Meal:  Snack:  Second Meal:  Snack:  Third Meal:  Snack:  Beverages:    NUTRITION INTERVENTION  Nutrition education (E-1) on the following topics:  Creation of balanced and diverse meals to increase the intake of nutrient-rich foods that provide essential vitamins, minerals, fiber, and phytonutrients  Variety of Fruits and Vegetables:  Aim for a colorful array of fruits and vegetables to ensure a wide range of nutrients. Include a mix of leafy greens, berries, citrus fruits, cruciferous vegetables, and more. Whole Grains: Choose whole grains over refined grains. Examples include brown rice, quinoa, oats, whole wheat, and barley. Lean Proteins: Include lean sources of protein, such as poultry, fish, tofu, legumes, beans, lentils, and low-fat dairy products. Limit red and processed meats. Healthy Fats: Incorporate sources of healthy fats, including avocados, nuts, seeds, and olive oil. Limit saturated and trans fats found in fried and processed foods. Dairy or Dairy Alternatives: Choose low-fat or fat-free dairy products, or plant-based alternatives like almond or soy milk. Portion Control: Be mindful of portion sizes to avoid overeating. Pay attention to hunger and satisfaction cues. Limit Added Sugars: Minimize the consumption of sugary beverages, snacks, and desserts. Check  food labels for added sugars and opt for natural sources of sweetness such as whole fruits. Hydration: Drink plenty of water throughout the day. Limit sugary drinks and excessive caffeine intake. Moderate Sodium Intake: Reduce the consumption of high-sodium foods. Use herbs and spices for flavor instead of excessive salt. Meal Planning and Preparation: Plan and prepare meals ahead of time to make healthier choices more convenient. Include a mix of food groups in each meal. Limit Processed Foods: Minimize the intake of highly processed and packaged foods that are often high in added sugars, salt, and unhealthy fats. Regular Physical Activity: Combine a healthy diet with regular physical activity for overall well-being. Aim for at least 150 minutes of moderate-intensity aerobic exercise per week, along with strength training. Moderation and Balance: Enjoy treats and indulgent foods in moderation, emphasizing balance rather than strict restriction.  Handouts Provided Include  detailed foods to avoid with alpha gal  Learning Style & Readiness for Change Teaching method utilized: Visual & Auditory  Demonstrated degree of understanding via: Teach Back  Barriers to learning/adherence to lifestyle change: none identified   Goals Established by Pt Avoid all mammalian foods and food products ioncluding dairy as you have diarrhea with them   MONITORING & EVALUATION Dietary intake, weekly physical activity  Next Steps  Patient is to call or email with any future questions or concerns.

## 2023-04-28 ENCOUNTER — Encounter: Payer: Self-pay | Admitting: Family Medicine

## 2023-04-28 ENCOUNTER — Encounter: Payer: Self-pay | Admitting: Pediatrics

## 2023-04-28 NOTE — Telephone Encounter (Signed)
 Called and spoke with patient. Patient states that he was not aware that he had an alpha gal allergy and discussed this with nutritionist yesterday. Patient states that he is no longer having dysphagia or heartburn. Patient states that he has been experimenting with his diet and his symptoms have improved over the last two weeks. Patient would like to continue with diet modifications at this time. Patient has cancelled upcoming EGD and colonoscopy since symptoms have improved. Patient had no concerns at the end of the call.

## 2023-04-28 NOTE — Telephone Encounter (Signed)
 Noted.

## 2023-04-29 ENCOUNTER — Encounter: Payer: Medicare HMO | Admitting: Pediatrics

## 2023-04-29 NOTE — Telephone Encounter (Signed)
 Documentation from home heart monitor report:  PT/SVT = at least 15 episodes SVE burden at least 5% PT/SVT did last more than 15 seconds  I called [facility] and asked them to fax me a full report and they will do so

## 2023-05-23 ENCOUNTER — Other Ambulatory Visit: Payer: Self-pay | Admitting: Family Medicine

## 2023-05-23 DIAGNOSIS — I10 Essential (primary) hypertension: Secondary | ICD-10-CM

## 2023-06-16 NOTE — Progress Notes (Deleted)
 [facility] Return Visit   Referring Provider Copland, Skipper Dumas, MD [address] [place],  [place] [address]  Primary Care Provider Copland, Skipper Dumas, MD  Patient Profile: Mckade Gurka is a 68 y.o. male with a history of HTN, vertigo, spinal stenosis, [facility] spotted fever and lung nodule who returns to the [facility] office for follow-up of the problem(s) noted below.  Problem List: Hyperlipasemia Chronic diarrhea Heartburn, dysphagia Positive alpha gal panel 03/2023 Left-sided colonic diverticulosis on imaging Colon cancer screening -Cologuard negative 11/2022  History of Present Illness   Mr. Trudo was last seen in the GI office 03/19/2023   Current GI Meds  Mylanta prn  Interval History   Chronic Diarrhea -- Longstanding history of loose stool dating back many years that seems to be worsening -- Having 6-7 loose, watery bowel movements a day with associated gas -- Endorses nocturnal stools up to 3 times a night -- Experiences fecal urgency and has only had 1 episode of incontinence in the remote past -- Denies significant abdominal pain or cramping -- No melena or hematochezia -- No previous colonoscopy -- No previous evaluation for diarrhea  -- Reports a family history of celiac disease in sister and maternal aunt -- Celiac antibodies drawn 01/2023 negative but no total serum IgA -follow-up serum IgA normal/elevated  -- Reports previous history of tick bites and [place] spotted fever   -- Labs 03/2023:     -- Stool studies negative for enteric pathogens    -- Fecal fat normal    -- Pancreatic elastase low at 53.  Could be dilutional effect    -- Alpha gal panel with positive markers  Hyperlipasemia -- Has had a persistently elevated lipase over the last 4 months ranging 94-408 -- No documented history of pancreatitis -- CTAP 11/2012 - normal pancreas, left colon diverticulosis without  diverticulitis -- Had been consuming larger quantities of alcohol after the passing of his wife 05/2022-has now cut back -- No family history of pancreatic disease or pancreatic cancer  Heartburn/dysphagia -- Endorses symptoms of heartburn with mild regurgitation -- Also noting a sensation of "food stopping" at the xiphoid process -- No vomiting or significant regurgitation -- At times feels uncomfortable when eating trying to get food down -- Taking Mylanta with benefit -- No previous EGD -- No family history of esophageal cancer  Colon cancer screening -- Cologuard 2024 negative -- No known family history of colorectal cancer or polyps  Last colonoscopy: No colonoscopy-Cologuard - 11/2022 Last endoscopy: None  Last Abd CT/CTE/MRE: CTAP 12/2022 -normal pancreas, left colon diverticulosis without diverticulitis  GI Review of Symptoms Significant for diarrhea, fecal urgency, heartburn, dysphagia. Otherwise negative.  General Review of Systems  Review of systems is significant for the pertinent positives and negatives as listed per the HPI.  Full ROS is otherwise negative.  Past Medical History   Past Medical History:  Diagnosis Date   Chronic pancreatitis (HCC)    Herniated cervical disc    History of kidney stones    Hypertension    MRSA (methicillin resistant Staphylococcus aureus)    Ulcer   [place] spotted fever Hyperlipidemia   Past Surgical History  No past surgical history on file.   Allergies and Medications  No Known Allergies   No outpatient medications have been marked as taking for the 06/18/23 encounter (Appointment) with Yvone Herd Scarlette Currier, MD.     Family History   Family History  Problem Relation Age  of Onset   Cancer Father        lung   Diabetes Sister    Colon cancer Neg Hx    Stomach cancer Neg Hx      Social History   Social History   Tobacco Use   Smoking status: Former    Current packs/day: 0.00    Average packs/day: 2.0  packs/day for 15.0 years (30.0 ttl pk-yrs)    Types: Cigarettes    Start date: 06/11/1971    Quit date: 06/11/1986    Years since quitting: 37.0   Smokeless tobacco: Never  Vaping Use   Vaping status: Never Used  Substance Use Topics   Alcohol use: Yes    Alcohol/week: 21.0 standard drinks of alcohol    Types: 21 Glasses of wine per week    Comment: occasional glass of wine   Drug use: No   Lionell reports that he quit smoking about 37 years ago. His smoking use included cigarettes. He started smoking about 52 years ago. He has a 30 pack-year smoking history. He has never used smokeless tobacco. He reports current alcohol use of about 21.0 standard drinks of alcohol per week. He reports that he does not use drugs.  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  There is no height or weight on file to calculate BMI.    General: Well developed, well nourished, no acute distress Head: Normocephalic and atraumatic Eyes: Sclerae anicteric, EOMI Lungs: Clear throughout to auscultation Heart: Regular rate and rhythm; No murmurs, rubs or bruits Abdomen: Soft, mild tenderness to palpation in the right lower quadrant and non distended. No masses, hepatosplenomegaly or hernias noted. Normal Bowel sounds Rectal:Deferred Musculoskeletal: Symmetrical with no gross deformities   Review of Data  The following data was reviewed at the time of this encounter:  Laboratory Studies      Latest Ref Rng & Units 03/19/2023    2:50 PM 11/28/2022    3:20 PM 11/19/2022   11:14 AM  CBC  WBC 4.0 - 10.5 K/uL 7.1  8.6  7.4   Hemoglobin 13.0 - 17.0 g/dL 16.1  09.6  04.5   Hematocrit 39.0 - 52.0 % 45.0  44.4  46.1   Platelets 150.0 - 400.0 K/uL 301.0  276  267.0     Lab Results  Component Value Date   LIPASE 106.0 (H) 03/19/2023      Latest Ref Rng &  Units 03/19/2023    2:50 PM 01/28/2023   10:00 AM 12/07/2022   10:47 AM  CMP  Glucose 70 - 99 mg/dL 96  82  91   BUN 6 - 23 mg/dL 19  17  15    Creatinine 0.40 - 1.50 mg/dL 4.09  8.11  9.14   Sodium 135 - 145 mEq/L 138  140  142   Potassium 3.5 - 5.1 mEq/L 3.7  3.9  3.8   Chloride 96 - 112 mEq/L 102  102  105   CO2 19 - 32 mEq/L 27  26  26    Calcium  8.4 - 10.5 mg/dL 9.5  9.1  9.4   Total Protein 6.0 - 8.3 g/dL 7.7  6.9    Total Bilirubin 0.2 - 1.2 mg/dL 0.8  0.4    Alkaline Phos 39 - 117 U/L 54  59    AST 0 - 37  U/L 17  16    ALT 0 - 53 U/L 19  17     Lab Results  Component Value Date   TSH 3.97 11/19/2022    Labs 01/28/2023 Vitamin EE 13.4 Vitamin D   32 Vitamin A    57 Vitamin B12   235 Folate 7.2 TTG IgA less than 1  Labs 03/2023 Stool studies negative for enteric pathogens Fecal fat normal Pancreatic elastase low at 53.  Could be dilutional effect Alpha gal panel with positive markers  11/2022  -Cologuard negative  Imaging Studies  CTAP 12/2022 Normal pancreas on CT. Mild left colonic diverticulosis, without evidence of diverticulitis.  GI Procedures and Studies  None   Clinical Impression  It is my clinical impression that Mr. Salo is a 68 y.o. male with;  Hyperlipasemia Chronic diarrhea Heartburn, dysphagia Positive alpha gal panel 03/2023 Left-sided colonic diverticulosis on imaging Colon cancer screening -Cologuard negative 11/2022  Mr. Haro is referred to the gastroenterology office for evaluation of multiple gastrointestinal issues as outlined above.  With regard to his chronic diarrhea we reviewed that the differential diagnosis could include: Celiac disease, non celiac gluten sensitivity, carbohydrate intolerance, alpha gal allergy, microscopic colitis, bile acid diarrhea, pancreatic insufficiency or SIBO.  While functional diarrhea remains a possibility the fact that he is having nocturnal bowel movements would be less consistent with that diagnosis.  I  have recommended rounding out his laboratory evaluation today for celiac disease and alpha gal allergy.  We will also perform stool studies.  If any etiology of his diarrhea cannot be elucidated through noninvasive testing he is amenable to undergoing colonoscopy with biopsies for microscopic colitis.  The etiology of his elevated lipase could be related to recent alcohol ingestion although it has not normalized since he has decreased alcohol intake.  His pancreas was normal on imaging -no findings to suggest chronic pancreatitis.  As above we are rounding out evaluation for celiac disease today which can be associated with hyperlipasemia.  He is endorsing new symptoms of heartburn and dysphagia that are being managed with Mylanta.  He is amenable to undergoing upper endoscopy for further evaluation of this.  He is up-to-date regarding colorectal cancer screening with a negative Cologuard in 2024.   Plan  Labs today: CBC, CMP, total IgA, lipase, alpha gal panel Stool studies: Bacterial culture, C. difficile, O&P, fecal fat, pancreatic elastase Schedule EGD for evaluation of dysphagia heartburn and colonoscopy for evaluation of chronic diarrhea -advised that if we elucidate an etiology of chronic diarrhea on noninvasive testing we can consider canceling colonoscopy May continue using Mylanta as needed for symptoms of heartburn pending results of EGD  Planned Follow Up No follow-ups on file.  The patient or caregiver verbalized understanding of the material covered, with no barriers to understanding. All questions were answered. Patient or caregiver is agreeable with the plan outlined above.    It was a pleasure to see Harrold.  If you have any questions or concerns regarding this evaluation, do not hesitate to contact me.  Eugenia Hess, MD [facility]

## 2023-06-18 ENCOUNTER — Ambulatory Visit: Payer: Medicare HMO | Admitting: Pediatrics

## 2023-06-21 ENCOUNTER — Encounter: Payer: Self-pay | Admitting: Family Medicine

## 2023-06-23 ENCOUNTER — Other Ambulatory Visit: Payer: Self-pay | Admitting: Family Medicine

## 2023-06-23 DIAGNOSIS — I10 Essential (primary) hypertension: Secondary | ICD-10-CM

## 2023-06-27 ENCOUNTER — Other Ambulatory Visit: Payer: Self-pay | Admitting: Family Medicine

## 2023-06-27 DIAGNOSIS — I10 Essential (primary) hypertension: Secondary | ICD-10-CM

## 2023-07-12 ENCOUNTER — Telehealth: Payer: Self-pay | Admitting: Family Medicine

## 2023-07-12 DIAGNOSIS — R002 Palpitations: Secondary | ICD-10-CM

## 2023-07-12 NOTE — Telephone Encounter (Signed)
 Called pt as he had not read mychart message about Zio patch.  He reports this was ordered as part of his insurance wellness program- we were not aware he was having a zio.  He denies palpitations  Patch showed  at least 15 episodes of PT/SVT, burden > 5% and can last 15 seconds or longer.  VE < 3% Advised pt I tend to think nothing further is needed but will run his by his cardiologist in case he wishes to see pt

## 2023-07-19 MED ORDER — METOPROLOL SUCCINATE ER 25 MG PO TB24: 25.0000 mg | ORAL_TABLET | Freq: Every day | ORAL | 3 refills | Status: AC

## 2023-07-19 NOTE — Addendum Note (Signed)
 Addended by: WATT RAISIN C on: 07/19/2023 02:21 PM   Modules accepted: Orders

## 2023-07-19 NOTE — Telephone Encounter (Signed)
 Called pt with recs from cardiology.  He would like to try BB Called in Toprol XL for him -he will let me know how this works  [employer]

## 2023-07-24 ENCOUNTER — Other Ambulatory Visit: Payer: Self-pay | Admitting: Family Medicine

## 2023-07-24 DIAGNOSIS — I10 Essential (primary) hypertension: Secondary | ICD-10-CM

## 2023-08-03 DIAGNOSIS — H40013 Open angle with borderline findings, low risk, bilateral: Secondary | ICD-10-CM | POA: Diagnosis not present

## 2023-08-22 NOTE — Progress Notes (Unsigned)
 [facility] Visit   Referring Provider Copland, Harlene BROCKS, MD [address] [place],  [place] [address]  Primary Care Provider Copland, Harlene BROCKS, MD  Patient Profile: Joel Black is a 68 y.o. male with a history of HTN, vertigo, spinal stenosis, [facility] spotted fever and lung nodule who returns to [facility]  for follow-up of the problem(s) noted below.  Problem List: Hyperlipasemia Chronic diarrhea Heartburn, dysphagia Alpha gal allergy Left-sided colonic diverticulosis on imaging Colon cancer screening -Cologuard negative 11/2022  History of Present Illness   Joel Black was last seen in the GI office 03/19/2023   Current GI Meds  None  Interval History  =Discussed the use of AI scribe software for clinical note transcription with the patient, who gave verbal consent to proceed.  Chronic Diarrhea -- Longstanding history of loose stool dating back many years  -- At last visit endorsed 6-7 loose, watery bowel movements a day with gas and 3 nocturnal bowel movements at night; described associated fecal urgency -- No prior colonoscopy  -- Labs March 2025: Stool studies negative for enteric pathogens, fecal elastase low at 53, fecal fat normal, negative celiac testing, positive alpha gal panel -- Saw dietitian for counseling regarding alpha gal diet  -- Ongoing diarrhea with approximately three bowel movements per day but improved from prior -- Stools are sometimes semi-formed -- Dietary modifications, including elimination of dairy, beef, pork, and lamb, have reduced urgency and improved ability to plan outings -- Continues to consume a small amount of milk in tea daily -- Continues to consume 3 to 4 glasses of wine per day-discussed impact of alcohol being a laxative -- Discussed option of updated EGD/colonoscopy which he continues to decline  Hyperlipasemia -- Has had a persistently elevated lipase 2024-2025  ranging 94-408 -- No documented history of pancreatitis -- CTAP 11/2012 - normal pancreas, left colon diverticulosis without diverticulitis -- Had been consuming larger quantities of alcohol after the passing of his wife 05/2022-has now cut back -- No family history of pancreatic disease or pancreatic cancer  Heartburn/dysphagia -- At last visit endorsed symptoms of heartburn with mild regurgitation -- Also noted a sensation of food stopping at the xiphoid process -- No vomiting or significant regurgitation -- At times feels uncomfortable when eating trying to get food down -- Taking Mylanta with benefit -- No previous EGD -- No family history of esophageal cancer  --Today reports that upper GI symptoms have largely resolved possibly related to dietary modification  Colon cancer screening -- Cologuard 2024 negative -- No known family history of colorectal cancer or polyps   Last colonoscopy: No colonoscopy-Cologuard - 11/2022 Last endoscopy: None  Last Abd CT/CTE/MRE: CTAP 12/2022 -normal pancreas, left colon diverticulosis without diverticulitis  GI Review of Symptoms Significant for loose stool but improved from prior otherwise negative.  General Review of Systems  Review of systems is significant for the pertinent positives and negatives as listed per the HPI.  Full ROS is otherwise negative.  Past Medical History   Past Medical History:  Diagnosis Date   Chronic pancreatitis (HCC)    Herniated cervical disc    History of kidney stones    Hypertension    MRSA (methicillin resistant Staphylococcus aureus)    Ulcer   [place] spotted fever Hyperlipidemia   Past Surgical History   Past Surgical History:  Procedure Laterality Date   DUPUYTREN CONTRACTURE RELEASE Left      Allergies and Medications  No Known Allergies  Current Meds  Medication Sig   aluminum-magnesium hydroxide 200-200 MG/5ML suspension Take by mouth every 6 (six) hours as needed for  indigestion.   Cholecalciferol (VITAMIN D3) 1.25 MG (50000 UT) CAPS Take 1 weekly for 12 weeks (Patient taking differently: as needed.)   hydrochlorothiazide  (MICROZIDE ) 12.5 MG capsule TAKE 1 CAPSULE BY MOUTH EVERY DAY   losartan  (COZAAR ) 100 MG tablet Take 1 tablet (100 mg total) by mouth daily.   metoprolol  succinate (TOPROL -XL) 25 MG 24 hr tablet Take 1 tablet (25 mg total) by mouth daily.     Family History   Family History  Problem Relation Age of Onset   Cancer Father        lung   Diabetes Sister    Colon cancer Neg Hx    Stomach cancer Neg Hx      Social History   Social History   Tobacco Use   Smoking status: Former    Current packs/day: 0.00    Average packs/day: 2.0 packs/day for 15.0 years (30.0 ttl pk-yrs)    Types: Cigarettes    Start date: 06/11/1971    Quit date: 06/11/1986    Years since quitting: 37.2   Smokeless tobacco: Never  Vaping Use   Vaping status: Never Used  Substance Use Topics   Alcohol use: Yes    Alcohol/week: 21.0 standard drinks of alcohol    Types: 21 Glasses of wine per week    Comment: occasional glass of wine   Drug use: No   Joel Black reports that he quit smoking about 37 years ago. His smoking use included cigarettes. He started smoking about 52 years ago. He has a 30 pack-year smoking history. He has never used smokeless tobacco. He reports current alcohol use of about 21.0 standard drinks of alcohol per week. He reports that he does not use drugs.  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 Weight: 192 lb (87.1 kg)  General: Well developed, well nourished, no acute distress Head: Normocephalic and atraumatic Eyes: Sclerae anicteric, EOMI Lungs: Clear throughout to auscultation Heart: Regular rate and rhythm; No murmurs, rubs or  bruits Abdomen: Soft, nontender and non distended. No masses, hepatosplenomegaly or hernias noted. Normal Bowel sounds Rectal:Deferred Musculoskeletal: Symmetrical with no gross deformities   Review of Data  The following data was reviewed at the time of this encounter:  Laboratory Studies      Latest Ref Rng & Units 08/24/2023   10:38 AM 03/19/2023    2:50 PM 11/28/2022    3:20 PM  CBC  WBC 4.0 - 10.5 K/uL 7.2  7.1  8.6   Hemoglobin 13.0 - 17.0 g/dL 84.8  84.3  84.6   Hematocrit 39.0 - 52.0 % 43.6  45.0  44.4   Platelets 150.0 - 400.0 K/uL 247.0  301.0  276     Lab Results  Component Value Date   LIPASE 42.0 08/24/2023      Latest Ref Rng & Units 08/24/2023   10:38 AM 03/19/2023    2:50 PM 01/28/2023   10:00 AM  CMP  Glucose 70 - 99 mg/dL 98  96  82   BUN 6 - 23 mg/dL 18  19  17    Creatinine 0.40 - 1.50 mg/dL 8.85  8.67  8.66  Sodium 135 - 145 mEq/L 136  138  140   Potassium 3.5 - 5.1 mEq/L 3.5  3.7  3.9   Chloride 96 - 112 mEq/L 101  102  102   CO2 19 - 32 mEq/L 27  27  26    Calcium  8.4 - 10.5 mg/dL 9.5  9.5  9.1   Total Protein 6.0 - 8.3 g/dL 7.4  7.7  6.9   Total Bilirubin 0.2 - 1.2 mg/dL 0.7  0.8  0.4   Alkaline Phos 39 - 117 U/L 57  54  59   AST 0 - 37 U/L 22  17  16    ALT 0 - 53 U/L 31  19  17     Lab Results  Component Value Date   TSH 3.97 11/19/2022   Labs 03/2023 Stool studies negative for enteric pathogens Fecal fat normal Pancreatic elastase 53  Labs 01/28/2023 Vitamin E 13.4 Vitamin D   32 Vitamin A    57 Vitamin B12   235 Folate 7.2 TTG IgA less than 1  11/2022  -Cologuard negative  Imaging Studies  CTAP 12/2022 Normal pancreas on CT. Mild left colonic diverticulosis, without evidence of diverticulitis.  GI Procedures and Studies  None   Clinical Impression  It is my clinical impression that Joel Black is a 68 y.o. male with;  Hyperlipasemia Chronic diarrhea Alpha gal allergy Heartburn, dysphagia -improved Left-sided colonic  diverticulosis on imaging Colon cancer screening -Cologuard negative 11/2022  Joel Black returns to the gastroenterology office for follow-up of multiple gastrointestinal issues as outlined above.  At the time of his visit in March 2025, I performed an evaluation for causes of chronic diarrhea which excluded chronic enteric pathogens and celiac disease.  Fecal elastase was low, however, fecal fat was normal suggesting that the low elastase may have reflected dilute elastase level rather than true pancreatic insufficiency.  His alpha gal panel was positive.  He has since been seen by a dietitian and embarked on dietary modification eliminating mammalian meat proteins and dairy.  Since doing so he has noted an improvement in his bowel habits with less diarrhea although it has not entirely resolved.  At today's visit we discussed his ongoing alcohol consumption of 3 to 4 glasses of wine per day.  Reviewed that alcohol is a laxative and this could be contributing to ongoing loose stools.  Again offered option of EGD and colonoscopy which he declined today.  The etiology of his elevated lipase could be related to recent alcohol ingestion although it has not normalized since he has decreased alcohol intake.  His pancreas was normal on imaging -no findings to suggest chronic pancreatitis.  I recommended updating his lipase level today.  If it remains persistently elevated we will consider proceeding with MRI/MRCP.  Joel Black was previously complaining of symptoms of heartburn and dysphagia for which she was taking intermittent Mylanta.  The symptoms have abated since he has embarked on dietary modification alpha gal diet and eliminating spicy foods.  He is up-to-date regarding colorectal cancer screening with a negative Cologuard in 2024.  Plan  Labs today: CBC, CMP, lipase, INR/PTT, GGT Continue alpha gal diet Reviewed limiting alcohol consumption both for liver health and to ameliorate diarrhea If diarrhea  persists can consider EGD/colonoscopy Dietary and lifestyle modification for GERD management May use Mylanta as needed for symptoms of heartburn Next Cologuard due 11/2025  Planned Follow Up Return in about 6 months (around 02/24/2024).  The patient or caregiver verbalized understanding of the material covered,  with no barriers to understanding. All questions were answered. Patient or caregiver is agreeable with the plan outlined above.    It was a pleasure to see Joel Black.  If you have any questions or concerns regarding this evaluation, do not hesitate to contact me.  Inocente Hausen, MD [facility]  I spent total of 30 minutes in both face-to-face (20 minutes interview) and non-face-to-face (10 minutes chart review, care coordination, documentation)  activities, excluding procedures performed, for the visit on the date of this encounter.

## 2023-08-24 ENCOUNTER — Encounter: Payer: Self-pay | Admitting: Pediatrics

## 2023-08-24 ENCOUNTER — Ambulatory Visit: Admitting: Pediatrics

## 2023-08-24 ENCOUNTER — Other Ambulatory Visit (INDEPENDENT_AMBULATORY_CARE_PROVIDER_SITE_OTHER)

## 2023-08-24 ENCOUNTER — Ambulatory Visit: Payer: Self-pay | Admitting: Pediatrics

## 2023-08-24 VITALS — BP 138/70 | HR 92 | Ht 71.5 in | Wt 192.0 lb

## 2023-08-24 DIAGNOSIS — K5730 Diverticulosis of large intestine without perforation or abscess without bleeding: Secondary | ICD-10-CM

## 2023-08-24 DIAGNOSIS — R1310 Dysphagia, unspecified: Secondary | ICD-10-CM

## 2023-08-24 DIAGNOSIS — R748 Abnormal levels of other serum enzymes: Secondary | ICD-10-CM

## 2023-08-24 DIAGNOSIS — E785 Hyperlipidemia, unspecified: Secondary | ICD-10-CM

## 2023-08-24 DIAGNOSIS — R197 Diarrhea, unspecified: Secondary | ICD-10-CM

## 2023-08-24 DIAGNOSIS — Z91014 Allergy to mammalian meats: Secondary | ICD-10-CM

## 2023-08-24 DIAGNOSIS — K529 Noninfective gastroenteritis and colitis, unspecified: Secondary | ICD-10-CM

## 2023-08-24 DIAGNOSIS — R12 Heartburn: Secondary | ICD-10-CM | POA: Diagnosis not present

## 2023-08-24 LAB — CBC WITH DIFFERENTIAL/PLATELET
Basophils Absolute: 0.1 K/uL (ref 0.0–0.1)
Basophils Relative: 1.1 % (ref 0.0–3.0)
Eosinophils Absolute: 0.4 K/uL (ref 0.0–0.7)
Eosinophils Relative: 6.2 % — ABNORMAL HIGH (ref 0.0–5.0)
HCT: 43.6 % (ref 39.0–52.0)
Hemoglobin: 15.1 g/dL (ref 13.0–17.0)
Lymphocytes Relative: 35 % (ref 12.0–46.0)
Lymphs Abs: 2.5 K/uL (ref 0.7–4.0)
MCHC: 34.5 g/dL (ref 30.0–36.0)
MCV: 97.9 fl (ref 78.0–100.0)
Monocytes Absolute: 0.8 K/uL (ref 0.1–1.0)
Monocytes Relative: 11 % (ref 3.0–12.0)
Neutro Abs: 3.3 K/uL (ref 1.4–7.7)
Neutrophils Relative %: 46.7 % (ref 43.0–77.0)
Platelets: 247 K/uL (ref 150.0–400.0)
RBC: 4.46 Mil/uL (ref 4.22–5.81)
RDW: 13.2 % (ref 11.5–15.5)
WBC: 7.2 K/uL (ref 4.0–10.5)

## 2023-08-24 LAB — COMPREHENSIVE METABOLIC PANEL WITH GFR
ALT: 31 U/L (ref 0–53)
AST: 22 U/L (ref 0–37)
Albumin: 4.3 g/dL (ref 3.5–5.2)
Alkaline Phosphatase: 57 U/L (ref 39–117)
BUN: 18 mg/dL (ref 6–23)
CO2: 27 meq/L (ref 19–32)
Calcium: 9.5 mg/dL (ref 8.4–10.5)
Chloride: 101 meq/L (ref 96–112)
Creatinine, Ser: 1.14 mg/dL (ref 0.40–1.50)
GFR: 65.31 mL/min (ref >60.00)
Glucose, Bld: 98 mg/dL (ref 70–99)
Potassium: 3.5 meq/L (ref 3.5–5.1)
Sodium: 136 meq/L (ref 135–145)
Total Bilirubin: 0.7 mg/dL (ref 0.2–1.2)
Total Protein: 7.4 g/dL (ref 6.0–8.3)

## 2023-08-24 LAB — LIPASE: Lipase: 42 U/L (ref 11.0–59.0)

## 2023-08-24 LAB — PROTIME-INR
INR: 1.1 ratio — ABNORMAL HIGH (ref 0.8–1.0)
Prothrombin Time: 11.3 s (ref 9.6–13.1)

## 2023-08-24 LAB — GAMMA GT: GGT: 67 U/L — ABNORMAL HIGH (ref 7–51)

## 2023-08-24 NOTE — Patient Instructions (Signed)
 Your provider has requested that you go to the basement level for lab work before leaving today. Press B on the elevator. The lab is located at the first door on the left as you exit the elevator.  Due to recent changes in healthcare laws, you may see the results of your imaging and laboratory studies on MyChart before your provider has had a chance to review them.  We understand that in some cases there may be results that are confusing or concerning to you. Not all laboratory results come back in the same time frame and the provider may be waiting for multiple results in order to interpret others.  Please give us  48 hours in order for your provider to thoroughly review all the results before contacting the office for clarification of your results.   _______________________________________________________  If your blood pressure at your visit was 140/90 or greater, please contact your primary care physician to follow up on this.  _______________________________________________________  If you are age 70 or older, your body mass index should be between 23-30. Your Body mass index is 26.41 kg/m. If this is out of the aforementioned range listed, please consider follow up with your Primary Care Provider.  If you are age 5 or younger, your body mass index should be between 19-25. Your Body mass index is 26.41 kg/m. If this is out of the aformentioned range listed, please consider follow up with your Primary Care Provider.   ________________________________________________________  The [facility] providers would like to encourage you to use MYCHART to communicate with providers for non-urgent requests or questions.  Due to long hold times on the telephone, sending your provider a message by [facility] may be a faster and more efficient way to get a response.  Please allow 48 business hours for a response.  Please remember that this is for non-urgent requests.   _______________________________________________________  [facility] is using a team-based approach to care.  Your team is made up of your doctor and two to three APPS. Our APPS (Nurse Practitioners and Physician Assistants) work with your physician to ensure care continuity for you. They are fully qualified to address your health concerns and develop a treatment plan. They communicate directly with your gastroenterologist to care for you. Seeing the Advanced Practice Practitioners on your physician's team can help you by facilitating care more promptly, often allowing for earlier appointments, access to diagnostic testing, procedures, and other specialty referrals.   Follow-up in  6 months. Office will contact you at a later time to schedule.   Thank you for choosing me and [facility].  Dr.Nancy McGreal

## 2023-09-07 ENCOUNTER — Other Ambulatory Visit: Payer: Self-pay | Admitting: Family Medicine

## 2023-09-07 DIAGNOSIS — I10 Essential (primary) hypertension: Secondary | ICD-10-CM

## 2023-09-11 NOTE — Progress Notes (Addendum)
 [facility] [address] [place], [place] [address] [phone]  Date:  09/13/2023   Name:  Joel Black   DOB:  1955-09-29   MRN:  [id number]  PCP:  Joel Harlene BROCKS, MD    Chief Complaint: Follow-up (Trigger finger pinky on R hand onset 3-4 years ago )   History of Present Illness:  Joel Black is a 68 y.o. very pleasant male patient who presents with the following:  Patient seen today for medication follow-up.  I last saw Joel Black in October History of hypertension, cervical spine stenosis, chronic pancreatitis/elevated lipase His wife Joel Black passed away 18-Jun-2024in an accident  He has followed up with GI regarding persistent diarrhea in the interim, most recent visit earlier this month Hyperlipasemia Chronic diarrhea Alpha gal allergy Heartburn, dysphagia -improved Left-sided colonic diverticulosis on imaging Colon cancer screening -Cologuard negative 11/2022   Joel Black returns to the gastroenterology office for follow-up of multiple gastrointestinal issues as outlined above.  At the time of his visit in March 2025, I performed an evaluation for causes of chronic diarrhea which excluded chronic enteric pathogens and celiac disease.  Fecal elastase was low, however, fecal fat was normal suggesting that the low elastase may have reflected dilute elastase level rather than true pancreatic insufficiency.  His alpha gal panel was positive.  He has since been seen by a dietitian and embarked on dietary modification eliminating mammalian meat proteins and dairy.  Since doing so he has noted an improvement in his bowel habits with less diarrhea although it has not entirely resolved.  At today's visit we discussed his ongoing alcohol consumption of 3 to 4 glasses of wine per day.  Reviewed that alcohol is a laxative and this could be contributing to ongoing loose stools.  Again offered option of EGD and  colonoscopy which he declined today.   The etiology of his elevated lipase could be related to recent alcohol ingestion although it has not normalized since he has decreased alcohol intake.  His pancreas was normal on imaging -no findings to suggest chronic pancreatitis.  I recommended updating his lipase level today.  If it remains persistently elevated we will consider proceeding with MRI/MRCP.   Joel Black was previously complaining of symptoms of heartburn and dysphagia for which she was taking intermittent Mylanta.  The symptoms have abated since he has embarked on dietary modification alpha gal diet and eliminating spicy foods.   He is up-to-date regarding colorectal cancer screening with a negative Cologuard in 2024.   Plan  Labs today: CBC, CMP, lipase, INR/PTT, GGT Continue alpha gal diet Reviewed limiting alcohol consumption both for liver health and to ameliorate diarrhea If diarrhea persists can consider EGD/colonoscopy Dietary and lifestyle modification for GERD management May use Mylanta as needed for symptoms of heartburn Next Cologuard due 11/2025  Flu vaccine recommended Recommend pneumonia vaccine Recommend Shingrix Recommend COVID booster this fall Tdap up-to-date Cologuard up-to-date Some labs on chart from earlier this month-CMP, CBC with differential, INR  He was noted to have a lung nodule back in 2015/2016; remained stable and considered to be benign.  Given smoking history can offer lung cancer screening CT. he may have quit smoking too long ago to qualify, but as he is interested in screening I will order the scan and we will see  He does not check his blood pressure at home   Discussed the use of AI scribe software  for clinical note transcription with the patient, who gave verbal consent to proceed.  History of Present Illness Joel Black is a 68 year old male with alpha-gal syndrome who presents for follow-up on gastrointestinal symptoms and other  health concerns.  He has been managing his gastrointestinal symptoms through dietary modifications, specifically by avoiding mammalian meats. Despite this, he occasionally consumes steak without adverse effects. He continues to eat chicken and believes that alpha-gal syndrome has contributed to his hair loss.  He is currently on hydrochlorothiazide , losartan , and metoprolol  but has not been monitoring his blood pressure at home. He has a history of a 30 pack-year smoking history but quit smoking long ago. He experiences significant mucus production.  He has Dupuytren's contracture, previously treated on one hand, now affecting his right hand and extending into another finger. He recalls being treated by Dr. Sissy, who may have retired?  In any case he would like to return to this practice, I gave him their contact info  He is experiencing sagging eyelids that are starting to impede his vision. He has contacted plastic surgeons, but they do not accept insurance for this issue.  He would like to have a consult with [facility] to see what they say  He has a rich social history, including a recent family reunion and a trip to [place]. His mother, who is 54 years old, is in good health and accompanies him on trips. He is also involved in creative projects, such as writing and facilities manager.  [name] notes he has just recently gotten back on the dating scene after the loss of his wife.  He is also involved with several different art projects. Patient Active Problem List   Diagnosis Date Noted   Vertigo 07/24/2017   Atypical chest pain 09/21/2015   BRBPR (bright red blood per rectum)    Stenosis, cervical spine 02/13/2015   Shortness of breath 03/19/2014   Solitary pulmonary nodule on lung CT 02/18/2014   HTN (hypertension) 10/21/2012    Past Medical History:  Diagnosis Date   Chronic pancreatitis (HCC)    Herniated cervical disc    History of kidney stones    Hypertension     MRSA (methicillin resistant Staphylococcus aureus)    Ulcer     Past Surgical History:  Procedure Laterality Date   DUPUYTREN CONTRACTURE RELEASE Left     Social History   Tobacco Use   Smoking status: Former    Current packs/day: 0.00    Average packs/day: 2.0 packs/day for 15.0 years (30.0 ttl pk-yrs)    Types: Cigarettes    Start date: 06/11/1971    Quit date: 06/11/1986    Years since quitting: 37.2   Smokeless tobacco: Never  Vaping Use   Vaping status: Never Used  Substance Use Topics   Alcohol use: Yes    Alcohol/week: 21.0 standard drinks of alcohol    Types: 21 Glasses of wine per week    Comment: occasional glass of wine   Drug use: No    Family History  Problem Relation Age of Onset   Cancer Father        lung   Diabetes Sister    Colon cancer Neg Hx    Stomach cancer Neg Hx     No Known Allergies  Medication list has been reviewed and updated.  Current Outpatient Medications on File Prior to Visit  Medication Sig Dispense Refill   aluminum-magnesium hydroxide 200-200 MG/5ML suspension Take by mouth every 6 (  six) hours as needed for indigestion.     Cholecalciferol (VITAMIN D3) 1.25 MG (50000 UT) CAPS Take 1 weekly for 12 weeks (Patient taking differently: as needed.) 12 capsule 0   hydrochlorothiazide  (MICROZIDE ) 12.5 MG capsule TAKE 1 CAPSULE BY MOUTH EVERY DAY 90 capsule 0   losartan  (COZAAR ) 100 MG tablet Take 1 tablet (100 mg total) by mouth daily. Needs appt 30 tablet 0   metoprolol  succinate (TOPROL -XL) 25 MG 24 hr tablet Take 1 tablet (25 mg total) by mouth daily. 90 tablet 3   No current facility-administered medications on file prior to visit.    Review of Systems:  As per HPI- otherwise negative.   Physical Examination: Vitals:   09/13/23 1405 09/13/23 1431  BP: (!) 146/74 135/80  Pulse: 91   SpO2: 94%    Vitals:   09/13/23 1405  Weight: 190 lb 12.8 oz (86.5 kg)  Height: 5' 11.5 (1.816 m)   Body mass index is 26.24  kg/m. Ideal Body Weight: Weight in (lb) to have BMI = 25: 181.4  GEN: no acute distress. Minimal overweight, looks well  HEENT: Atraumatic, Normocephalic.  Bilateral TM wnl, oropharynx normal.  PEERL,EOMI.   he does have bilateral ptosis which may impede vision Ears and Nose: No external deformity. CV: RRR, No M/G/R. No JVD. No thrill. No extra heart sounds. PULM: CTA B, no wheezes, crackles, rhonchi. No retractions. No resp. distress. No accessory muscle use. ABD: S, NT, ND EXTR: No c/c/e PSYCH: Normally interactive. Conversant.  Significant Dupuytren's contracture right hand, especially pinky finger.  Status post surgery on the left hand-successful 138/74 Assessment and Plan: Primary hypertension  Stenosis, cervical spine  Special screening, prostate cancer - Plan: PSA  Screening for diabetes mellitus - Plan: Hemoglobin A1c  Screening, lipid - Plan: Lipid panel  History of smoking - Plan: CT CHEST LUNG CA SCREEN LOW DOSE W/O CM  Ptosis of both eyelids - Plan: Ambulatory referral to Plastic Surgery  Assessment and Plan Assessment & Plan Alpha-gal syndrome Improved with dietary changes, specifically avoiding mammalian meats.  Dupuytren's contracture, right hand Progression with extension into fingers. Previous left hand surgery.  - Provide contact information for Dr. Berton practice for surgical consultation. - Instruct to schedule an appointment for surgical intervention on the right hand.  Unspecified ptosis of bilateral eyelids Causing visual impairment. Previous consultations did not result in insurance coverage for corrective surgery. - Make a referral to [facility] for evaluation and discussion of surgical options for eyelid ptosis.  Essential hypertension Blood pressure at 138/80 mmHg is acceptable. On hydrochlorothiazide  and losartan . - Continue current antihypertensive medications.  Chronic diarrhea Improved with dietary modifications,  specifically avoiding mammalian meats due to alpha-gal syndrome.  General Health Maintenance Lung cancer screening CT discussed due to smoking history. Insurance coverage may be limited due to time since smoking cessation. - Order lung cancer screening CT and attempt insurance approval. - Perform blood work including A1c, cholesterol, and PSA.    Signed Harlene Schroeder, MD  Addendum 8/26, received labs as below.  Message to patient  Results for orders placed or performed in visit on 09/13/23  PSA   Collection Time: 09/13/23  2:45 PM  Result Value Ref Range   PSA 3.18 0.10 - 4.00 ng/mL  Hemoglobin A1c   Collection Time: 09/13/23  2:45 PM  Result Value Ref Range   Hgb A1c MFr Bld 5.6 4.6 - 6.5 %  Lipid panel   Collection Time: 09/13/23  2:45 PM  Result  Value Ref Range   Cholesterol 227 (H) 0 - 200 mg/dL   Triglycerides 834.9 (H) 0.0 - 149.0 mg/dL   HDL 38.49 >60.99 mg/dL   VLDL 66.9 0.0 - 59.9 mg/dL   LDL Cholesterol 866 (H) 0 - 99 mg/dL   Total CHOL/HDL Ratio 4    NonHDL 165.78

## 2023-09-11 NOTE — Patient Instructions (Incomplete)
 It was great to see you again today I do recommend a few immunizations! Flu shot and COVID booster this fall One-time dose of RSV One-time shingles series  Please give Dr Joel a call and set up an appt for your right hand   [facility] [place]  [address]  [place], [place] [address]  [phone]  Joel Donnice Cornet, MD  Dupuytren's contracture of left hand (Primary Dx)   We will set you up to see plastic surgery to discuss your eyelids

## 2023-09-13 ENCOUNTER — Encounter: Payer: Self-pay | Admitting: Family Medicine

## 2023-09-13 ENCOUNTER — Ambulatory Visit (INDEPENDENT_AMBULATORY_CARE_PROVIDER_SITE_OTHER): Admitting: Family Medicine

## 2023-09-13 VITALS — BP 138/74 | HR 91 | Ht 71.5 in | Wt 190.8 lb

## 2023-09-13 DIAGNOSIS — Z13220 Encounter for screening for lipoid disorders: Secondary | ICD-10-CM | POA: Diagnosis not present

## 2023-09-13 DIAGNOSIS — Z131 Encounter for screening for diabetes mellitus: Secondary | ICD-10-CM

## 2023-09-13 DIAGNOSIS — H02403 Unspecified ptosis of bilateral eyelids: Secondary | ICD-10-CM

## 2023-09-13 DIAGNOSIS — I10 Essential (primary) hypertension: Secondary | ICD-10-CM

## 2023-09-13 DIAGNOSIS — Z87891 Personal history of nicotine dependence: Secondary | ICD-10-CM

## 2023-09-13 DIAGNOSIS — Z125 Encounter for screening for malignant neoplasm of prostate: Secondary | ICD-10-CM | POA: Diagnosis not present

## 2023-09-13 DIAGNOSIS — M4802 Spinal stenosis, cervical region: Secondary | ICD-10-CM

## 2023-09-13 DIAGNOSIS — R972 Elevated prostate specific antigen [PSA]: Secondary | ICD-10-CM

## 2023-09-14 ENCOUNTER — Encounter: Payer: Self-pay | Admitting: Family Medicine

## 2023-09-14 LAB — HEMOGLOBIN A1C: Hgb A1c MFr Bld: 5.6 % (ref 4.6–6.5)

## 2023-09-14 LAB — LIPID PANEL
Cholesterol: 227 mg/dL — ABNORMAL HIGH (ref 0–200)
HDL: 61.5 mg/dL (ref >39.00)
LDL Cholesterol: 133 mg/dL — ABNORMAL HIGH (ref 0–99)
NonHDL: 165.78
Total CHOL/HDL Ratio: 4
Triglycerides: 165 mg/dL — ABNORMAL HIGH (ref 0.0–149.0)
VLDL: 33 mg/dL (ref 0.0–40.0)

## 2023-09-14 LAB — PSA: PSA: 3.18 ng/mL (ref 0.10–4.00)

## 2023-09-14 NOTE — Addendum Note (Signed)
 Addended by: WATT HARLENE BROCKS on: 09/14/2023 03:33 PM   Modules accepted: Orders

## 2023-09-16 ENCOUNTER — Ambulatory Visit

## 2023-09-16 VITALS — BP 177/96 | HR 89

## 2023-09-16 DIAGNOSIS — H02834 Dermatochalasis of left upper eyelid: Secondary | ICD-10-CM

## 2023-09-16 DIAGNOSIS — H02839 Dermatochalasis of unspecified eye, unspecified eyelid: Secondary | ICD-10-CM

## 2023-09-16 DIAGNOSIS — H02831 Dermatochalasis of right upper eyelid: Secondary | ICD-10-CM

## 2023-09-16 NOTE — Progress Notes (Addendum)
 NAME: Joel Black  MRN: [id number]  DOB: [birth year]    Referring physician:??Copland, Harlene BROCKS, MD  PCP:?Copland, Harlene BROCKS, MD    CHIEF COMPLAINT:?Facial aesthetics    HPI:?  This is a 68 y.o. year old male with history of HTN (well controlled with medication) who presents in consultation for bilateral blepharoplasties.    Specifically, the patient is most bothered by the appearance of his upper eyelids. He is bothered by the excess skin and wrinkles.     She has been evaluated by an optomitrist for reduced visual fields. He will bring the report to the office.   History of dry eyes? No   Prior refractive surgery? No   Use of neurotoxin/fillers? No   Patient denies h/o glaucoma, coagulopathies, or thyroid  conditions. Not on any anticoagulation.    He is an Tree surgeon and due to vision problems related to his dermatochalasis, he is having issues finishing his art pieces.     Family History:   Family history is negative for bleeding/clotting disorders, problems with anesthesia, connective tissue disorders.     Social History:   Social History   Socioeconomic History   Marital status: Widowed    Spouse name: Not on file   Number of children: 1   Years of education: Not on file   Highest education level: Not on file  Occupational History   Occupation: artist/sculptor   Tobacco Use   Smoking status: Former    Current packs/day: 0.00    Average packs/day: 2.0 packs/day for 15.0 years (30.0 ttl pk-yrs)    Types: Cigarettes    Start date: 06/11/1971    Quit date: 06/11/1986    Years since quitting: 37.2   Smokeless tobacco: Never  Vaping Use   Vaping status: Never Used  Substance and Sexual Activity   Alcohol use: Yes    Alcohol/week: 21.0 standard drinks of alcohol    Types: 21 Glasses of wine per week    Comment: occasional glass of wine   Drug use: No   Sexual activity: Yes    Partners: Female  Other Topics Concern   Not on file  Social History Narrative    Not on file   Social Drivers of Health   Financial Resource Strain: Low Risk  (10/28/2022)   Overall Financial Resource Strain (CARDIA)    Difficulty of Paying Living Expenses: Not hard at all  Food Insecurity: No Food Insecurity (10/28/2022)   Hunger Vital Sign    Worried About Running Out of Food in the Last Year: Never true    Ran Out of Food in the Last Year: Never true  Transportation Needs: No Transportation Needs (10/28/2022)   PRAPARE - Administrator, Civil Service (Medical): No    Lack of Transportation (Non-Medical): No  Physical Activity: Inactive (10/28/2022)   Exercise Vital Sign    Days of Exercise per Week: 0 days    Minutes of Exercise per Session: 0 min  Stress: No Stress Concern Present (10/28/2022)   [employer] of Occupational Health - Occupational Stress Questionnaire    Feeling of Stress : Only a little  Social Connections: Moderately Isolated (10/28/2022)   Social Connection and Isolation Panel    Frequency of Communication with Friends and Family: More than three times a week    Frequency of Social Gatherings with Friends and Family: Once a week    Attends Religious Services: More than 4 times per year    Active Member of [employer] or Organizations:  No    Attends Banker Meetings: Never    Marital Status: Widowed    Smoker/Vape Hospital doctor Lives: Alone  Occupation: Warden/ranger Drug use: Denies   PMH:  Past Medical History:  Diagnosis Date   Chronic pancreatitis (HCC)    Herniated cervical disc    History of kidney stones    Hypertension    MRSA (methicillin resistant Staphylococcus aureus)    Ulcer       PSH:  Past Surgical History:  Procedure Laterality Date   DUPUYTREN CONTRACTURE RELEASE Left       MEDICATIONS:?   Current Outpatient Medications:    aluminum-magnesium hydroxide 200-200 MG/5ML suspension, Take by mouth every 6 (six) hours as needed for indigestion., Disp: , Rfl:    Cholecalciferol (VITAMIN D3)  1.25 MG (50000 UT) CAPS, Take 1 weekly for 12 weeks (Patient taking differently: as needed.), Disp: 12 capsule, Rfl: 0   hydrochlorothiazide  (MICROZIDE ) 12.5 MG capsule, TAKE 1 CAPSULE BY MOUTH EVERY DAY, Disp: 90 capsule, Rfl: 0   losartan  (COZAAR ) 100 MG tablet, Take 1 tablet (100 mg total) by mouth daily. Needs appt, Disp: 30 tablet, Rfl: 0   metoprolol  succinate (TOPROL -XL) 25 MG 24 hr tablet, Take 1 tablet (25 mg total) by mouth daily., Disp: 90 tablet, Rfl: 3    ALLERGIES:?  has no known allergies.    REVIEW OF SYSTEMS:  Review of Systems  ROS negative except as noted in HPI    VITALS:  There were no vitals taken for this visit.   There were no vitals taken for this visit.  There is no height or weight on file to calculate BMI.  General: Well appearing, no apparent distress.  -Brow: There are static and dynamic horizontal rhytids of the forehead. Glabellar hypertophy. Brows are at the level of the supraorbital rim. Compensated brow ptosis present with lateral brow laxity.   -Upper lid: Dermatochalasis of upper lid with excess skin covering lid eyelashes. Unable to assess fat or lacrimal gland prolapse due to amount of skin.  -Lower lid: Dermatochalasis. Presence of orbital fat prolapse. Snap back test is delayed.Negative vector. Prominent orbitomalar crease.     ASSESSMENT/PLAN  Assessment & Plan       The risks, benefits, goals and alternatives of upper blepharoplasty & canthopexy were explained to the patient in detail as follows. The surgery is cosmetic in nature, and elective. After surgery it is sometimes the eyelids will not close very well and it is imperative that the patient uses the eye medications as prescribed to protect the eye. The risk of bleeding has been stressed and the patient understands that this can lead to blindness, hence the importance of letting us  know if any swelling on one side usually develops or gets worse and or she develops pain and vision issues, even  blindness., Other potentially functional risks discussed include dry eyes, cornea or other eye injury, inability to completely close the eyes, ectropion (rolled out, droopy lower lid), blindness, and need for additional surgery at additional expense. Infection and scarring are not common but can happen. Aesthetic risks or dissatisfaction can arise from asymmetry, and change in the shape of the eyelid that is possible after blepharoplasty. Asymmetry is quite common in every person and sometimes surgery can unmask this or accentuate this. If we are to remove the corrugator muscle, there is a risk of numbness and neuropathic pain in the forehead.   We did discuss surgery and anesthesia in general and the risks  related such as DVT/PE, infection, bleeding, organ injury, and other cardiovascular events. These are unexpected but the risk thereof can never be 0%.     The patient has a good understanding of all the risks and benefits, postoperative course and care. We obtained pictures and will give the patient quotes. All questions were answered.     The plan agreed upon includes: Patient will submit his vision test for insurance authorization. Upper blepharoplasties with bilateral canthopexy.  I spent 30 minutes counseling the patient and discussing plan of care.  Aubreana Cornacchia M Delano Frate

## 2023-09-21 DIAGNOSIS — H2513 Age-related nuclear cataract, bilateral: Secondary | ICD-10-CM | POA: Diagnosis not present

## 2023-09-21 DIAGNOSIS — H35362 Drusen (degenerative) of macula, left eye: Secondary | ICD-10-CM | POA: Diagnosis not present

## 2023-09-21 DIAGNOSIS — H40013 Open angle with borderline findings, low risk, bilateral: Secondary | ICD-10-CM | POA: Diagnosis not present

## 2023-09-21 DIAGNOSIS — H43392 Other vitreous opacities, left eye: Secondary | ICD-10-CM | POA: Diagnosis not present

## 2023-09-30 ENCOUNTER — Encounter: Payer: Self-pay | Admitting: Family Medicine

## 2023-09-30 DIAGNOSIS — M720 Palmar fascial fibromatosis [Dupuytren]: Secondary | ICD-10-CM | POA: Diagnosis not present

## 2023-10-04 ENCOUNTER — Ambulatory Visit (HOSPITAL_BASED_OUTPATIENT_CLINIC_OR_DEPARTMENT_OTHER)

## 2023-10-04 NOTE — Addendum Note (Signed)
 Addended by: EUSTACIO POUR on: 10/04/2023 03:23 PM   Modules accepted: Orders

## 2023-10-13 ENCOUNTER — Ambulatory Visit (HOSPITAL_BASED_OUTPATIENT_CLINIC_OR_DEPARTMENT_OTHER)

## 2023-10-22 ENCOUNTER — Other Ambulatory Visit: Payer: Self-pay | Admitting: Family Medicine

## 2023-10-22 DIAGNOSIS — I10 Essential (primary) hypertension: Secondary | ICD-10-CM

## 2023-10-31 ENCOUNTER — Other Ambulatory Visit: Payer: Self-pay | Admitting: Family Medicine

## 2023-10-31 DIAGNOSIS — I10 Essential (primary) hypertension: Secondary | ICD-10-CM

## 2023-11-04 DIAGNOSIS — R252 Cramp and spasm: Secondary | ICD-10-CM | POA: Diagnosis not present

## 2023-11-04 DIAGNOSIS — M720 Palmar fascial fibromatosis [Dupuytren]: Secondary | ICD-10-CM | POA: Diagnosis not present

## 2023-12-02 DIAGNOSIS — M720 Palmar fascial fibromatosis [Dupuytren]: Secondary | ICD-10-CM | POA: Diagnosis not present

## 2023-12-02 DIAGNOSIS — G5601 Carpal tunnel syndrome, right upper limb: Secondary | ICD-10-CM | POA: Diagnosis not present

## 2023-12-02 DIAGNOSIS — M5412 Radiculopathy, cervical region: Secondary | ICD-10-CM | POA: Diagnosis not present

## 2023-12-20 ENCOUNTER — Ambulatory Visit: Admitting: *Deleted

## 2023-12-20 VITALS — Ht 71.5 in | Wt 190.0 lb

## 2023-12-20 DIAGNOSIS — Z0000 Encounter for general adult medical examination without abnormal findings: Secondary | ICD-10-CM

## 2023-12-20 NOTE — Patient Instructions (Addendum)
 Joel Black,  Thank you for taking the time for your Medicare Wellness Visit. I appreciate your continued commitment to your health goals. Please review the care plan we discussed, and feel free to reach out if I can assist you further.  Please note that Annual Wellness Visits do not include a physical exam. Some assessments may be limited, especially if the visit was conducted virtually. If needed, we may recommend an in-person follow-up with your provider.  Ongoing Care Seeing your primary care provider every 3 to 6 months helps us  monitor your health and provide consistent, personalized care.   Dr Watt: 12/29/23 1pm Medicare AWV:  12/27/24 1:40pm, telephone  Referrals If a referral was made during today's visit and you haven't received any updates within two weeks, please contact the referred provider directly to check on the status.  CT Lung cancer screening ([facility]):  [phone] (please call to schedule at your earliest convenience)  Recommended Screenings:  You will need to get the following vaccines at your local pharmacy: Flu, Pneumonia and Shingles if you change your mind.  Health Maintenance  Topic Date Due   COVID-19 Vaccine (1 - 2025-26 season) 01/05/2024*   Zoster (Shingles) Vaccine (1 of 2) 03/19/2024*   Flu Shot  04/18/2024*   Pneumococcal Vaccine for age over 6 (1 of 1 - PCV) 12/19/2024*   Medicare Annual Wellness Visit  12/19/2024   DTaP/Tdap/Td vaccine (2 - Td or Tdap) 02/12/2025   Cologuard (Stool DNA test)  11/24/2025   Hepatitis C Screening  Completed   Meningitis B Vaccine  Aged Out  *Topic was postponed. The date shown is not the original due date.       12/20/2023    1:54 PM  Advanced Directives  Does Patient Have a Medical Advance Directive? Yes  Type of Advance Directive Living will  Does patient want to make changes to medical advance directive? No - Patient declined   Please bring a copy of your health care power of attorney and  living will to the office to be added to your chart at your convenience. You can also mail a copy to [facility] [address] [place], [place] [address] or email to [email or web address]   Vision: Annual vision screenings are recommended for early detection of glaucoma, cataracts, and diabetic retinopathy. These exams can also reveal signs of chronic conditions such as diabetes and high blood pressure.  Dental: Annual dental screenings help detect early signs of oral cancer, gum disease, and other conditions linked to overall health, including heart disease and diabetes.  Please see the attached documents for additional preventive care recommendations.

## 2023-12-20 NOTE — Progress Notes (Signed)
 Chief Complaint  Patient presents with   Medicare Wellness     Subjective:   Joel Black is a 68 y.o. male who presents for a Medicare Annual Wellness Visit.  Visit info / Clinical Intake: Medicare Wellness Visit Type:: Subsequent Annual Wellness Visit Persons participating in visit and providing information:: patient Medicare Wellness Visit Mode:: Telephone If telephone:: video declined Since this visit was completed virtually, some vitals may be partially provided or unavailable. Missing vitals are due to the limitations of the virtual format.: Unable to obtain vitals - no equipment If Telephone or Video please confirm:: I connected with patient using audio/video enable telemedicine. I verified patient identity with two identifiers, discussed telehealth limitations, and patient agreed to proceed. Patient Location:: home Provider Location:: office Interpreter Needed?: No Pre-visit prep was completed: yes AWV questionnaire completed by patient prior to visit?: no Living arrangements:: with family/others (daughter lives with pt) Patient's Overall Health Status Rating: good Typical amount of pain: none Does pain affect daily life?: no Are you currently prescribed opioids?: no  Dietary Habits and Nutritional Risks How many meals a day?: 2 Eats fruit and vegetables daily?: yes Most meals are obtained by: preparing own meals In the last 2 weeks, have you had any of the following?: none (improved since diagnosis of alpha gal. Less frequent but still very soft) Diabetic:: no  Functional Status Activities of Daily Living (to include ambulation/medication): Independent Ambulation: Independent Medication Administration: Independent Home Management: Independent Manage your own finances?: yes Primary transportation is: driving Concerns about vision?: (!) yes (up to date with [facility]) Concerns about hearing?: (!) yes (has a hissing sound in both  ears x 2 weeks) Uses hearing aids?: no  Fall Screening Falls in the past year?: 0 Number of falls in past year: 0 Was there an injury with Fall?: 0 Fall Risk Category Calculator: 0 Patient Fall Risk Level: Low Fall Risk  Fall Risk Patient at Risk for Falls Due to: Orthopedic patient Fall risk Follow up: Falls evaluation completed  Home and Transportation Safety: All rugs have non-skid backing?: yes All stairs or steps have railings?: yes Grab bars in the bathtub or shower?: (!) no Have non-skid surface in bathtub or shower?: yes Good home lighting?: yes Regular seat belt use?: yes Hospital stays in the last year:: no  Cognitive Assessment Difficulty concentrating, remembering, or making decisions? : yes (notes some difficulty with name recall) Will 6CIT or Mini Cog be Completed: yes What year is it?: 0 points What month is it?: 0 points Give patient an address phrase to remember (5 components): [address] [place]  About what time is it?: 0 points Count backwards from 20 to 1: 0 points Say the months of the year in reverse: 2 points Repeat the address phrase from earlier: 0 points 6 CIT Score: 2 points  Advance Directives (For Healthcare) Does Patient Have a Medical Advance Directive?: Yes Does patient want to make changes to medical advance directive?: No - Patient declined Type of Advance Directive: Living will Copy of Living Will in Chart?: No - copy requested  Reviewed/Updated  Reviewed/Updated: Reviewed All (Medical, Surgical, Family, Medications, Allergies, Care Teams, Patient Goals)    Allergies (verified) Patient has no known allergies.   Current Medications (verified) Outpatient Encounter Medications as of 12/20/2023  Medication Sig   hydrochlorothiazide  (MICROZIDE ) 12.5 MG capsule Take 1 capsule (12.5 mg total) by mouth daily.   losartan  (COZAAR ) 100 MG tablet TAKE 1 TABLET (100 MG  TOTAL) BY MOUTH DAILY. NEEDS APPT   metoprolol  succinate  (TOPROL -XL) 25 MG 24 hr tablet Take 1 tablet (25 mg total) by mouth daily.   [DISCONTINUED] aluminum-magnesium hydroxide 200-200 MG/5ML suspension Take by mouth every 6 (six) hours as needed for indigestion. (Patient not taking: Reported on 09/16/2023)   [DISCONTINUED] Cholecalciferol (VITAMIN D3) 1.25 MG (50000 UT) CAPS Take 1 weekly for 12 weeks (Patient not taking: Reported on 09/16/2023)   No facility-administered encounter medications on file as of 12/20/2023.    History: Past Medical History:  Diagnosis Date   Allergy to alpha-gal    Chronic pancreatitis (HCC)    Herniated cervical disc    History of kidney stones    Hypertension    MRSA (methicillin resistant Staphylococcus aureus)    Ulcer    Past Surgical History:  Procedure Laterality Date   DUPUYTREN CONTRACTURE RELEASE Left    Family History  Problem Relation Age of Onset   Cancer Father        lung   Diabetes Sister    Colon cancer Neg Hx    Stomach cancer Neg Hx    Social History   Occupational History   Occupation: merchandiser, retail   Tobacco Use   Smoking status: Former    Current packs/day: 0.00    Average packs/day: 2.0 packs/day for 15.0 years (30.0 ttl pk-yrs)    Types: Cigarettes    Start date: 06/11/1971    Quit date: 06/11/1986    Years since quitting: 37.5   Smokeless tobacco: Never  Vaping Use   Vaping status: Never Used  Substance and Sexual Activity   Alcohol use: Yes    Alcohol/week: 21.0 standard drinks of alcohol    Types: 21 Glasses of wine per week    Comment: occasional glass of wine   Drug use: No   Sexual activity: Yes    Partners: Female   Tobacco Counseling Counseling given: Not Answered  SDOH Screenings   Food Insecurity: No Food Insecurity (12/20/2023)  Housing: Low Risk  (12/20/2023)  Transportation Needs: No Transportation Needs (12/20/2023)  Utilities: Not At Risk (12/20/2023)  Alcohol Screen: Low Risk  (10/28/2022)  Depression (PHQ2-9): Low Risk  (12/20/2023)  Financial  Resource Strain: Low Risk  (10/28/2022)  Physical Activity: Inactive (12/20/2023)  Social Connections: Moderately Isolated (12/20/2023)  Stress: No Stress Concern Present (12/20/2023)  Tobacco Use: Medium Risk (12/20/2023)  Health Literacy: Adequate Health Literacy (10/28/2022)   See flowsheets for full screening details  Depression Screen PHQ 2 & 9 Depression Scale- Over the past 2 weeks, how often have you been bothered by any of the following problems? Little interest or pleasure in doing things: 0 Feeling down, depressed, or hopeless (PHQ Adolescent also includes...irritable): 0 PHQ-2 Total Score: 0 Trouble falling or staying asleep, or sleeping too much: 1 (wakes up at 6 then sleeps until 9am) Feeling tired or having little energy: 0 Poor appetite or overeating (PHQ Adolescent also includes...weight loss): 0 Feeling bad about yourself - or that you are a failure or have let yourself or your family down: 0 Trouble concentrating on things, such as reading the newspaper or watching television (PHQ Adolescent also includes...like school work): 0 Moving or speaking so slowly that other people could have noticed. Or the opposite - being so fidgety or restless that you have been moving around a lot more than usual: 0 Thoughts that you would be better off dead, or of hurting yourself in some way: 0 PHQ-9 Total Score: 1 If you checked  off any problems, how difficult have these problems made it for you to do your work, take care of things at home, or get along with other people?: Not difficult at all  Depression Treatment Depression Interventions/Treatment : EYV7-0 Score <4 Follow-up Not Indicated     Goals Addressed             This Visit's Progress    Patient Stated   On track    Would like to increase energy level             Objective:    Today's Vitals   12/20/23 1348  Weight: 190 lb (86.2 kg)  Height: 5' 11.5 (1.816 m)   Body mass index is 26.13 kg/m.  Hearing/Vision  screen No results found. Immunizations and Health Maintenance Health Maintenance  Topic Date Due   COVID-19 Vaccine (1 - 2025-26 season) 01/05/2024 (Originally 09/20/2023)   Zoster Vaccines- Shingrix (1 of 2) 03/19/2024 (Originally 06/20/2003)   Influenza Vaccine  04/18/2024 (Originally 08/20/2023)   Pneumococcal Vaccine: 50+ Years (1 of 1 - PCV) 12/19/2024 (Originally 06/20/2003)   Medicare Annual Wellness (AWV)  12/19/2024   DTaP/Tdap/Td (2 - Td or Tdap) 02/12/2025   Fecal DNA (Cologuard)  11/24/2025   Hepatitis C Screening  Completed   Meningococcal B Vaccine  Aged Out        Assessment/Plan:  This is a routine wellness examination for [place].  Patient Care Team: Copland, Harlene BROCKS, MD as PCP - General (Family Medicine) Montorfano, Lisandro M, MD as Consulting Physician (Plastic Surgery) Germaine Ozell FALCON, MD as Consulting Physician (Plastic Surgery)  I have personally reviewed and noted the following in the patient's chart:   Medical and social history Use of alcohol, tobacco or illicit drugs  Current medications and supplements including opioid prescriptions. Functional ability and status Nutritional status Physical activity Advanced directives List of other physicians Hospitalizations, surgeries, and ER visits in previous 12 months Vitals Screenings to include cognitive, depression, and falls Referrals and appointments  No orders of the defined types were placed in this encounter.  In addition, I have reviewed and discussed with patient certain preventive protocols, quality metrics, and best practice recommendations. A written personalized care plan for preventive services as well as general preventive health recommendations were provided to patient.   Lolita Libra, CMA   12/20/2023   Return in 1 year (on 12/19/2024).  After Visit Summary: (MyChart) Due to this being a telephonic visit, the after visit summary with patients personalized plan was offered to patient via  MyChart   Nurse Notes: nothing significant to report

## 2023-12-24 ENCOUNTER — Emergency Department (HOSPITAL_COMMUNITY)
Admission: EM | Admit: 2023-12-24 | Discharge: 2023-12-24 | Disposition: A | Discharge status: Home / Self Care | Attending: Emergency Medicine | Admitting: Emergency Medicine

## 2023-12-24 ENCOUNTER — Other Ambulatory Visit: Payer: Self-pay

## 2023-12-24 ENCOUNTER — Ambulatory Visit: Payer: Self-pay

## 2023-12-24 ENCOUNTER — Emergency Department (HOSPITAL_COMMUNITY)

## 2023-12-24 ENCOUNTER — Encounter (HOSPITAL_COMMUNITY): Payer: Self-pay

## 2023-12-24 DIAGNOSIS — K5792 Diverticulitis of intestine, part unspecified, without perforation or abscess without bleeding: Secondary | ICD-10-CM

## 2023-12-24 DIAGNOSIS — Z79899 Other long term (current) drug therapy: Secondary | ICD-10-CM | POA: Diagnosis not present

## 2023-12-24 DIAGNOSIS — I10 Essential (primary) hypertension: Secondary | ICD-10-CM | POA: Diagnosis not present

## 2023-12-24 DIAGNOSIS — K5733 Diverticulitis of large intestine without perforation or abscess with bleeding: Secondary | ICD-10-CM | POA: Diagnosis not present

## 2023-12-24 DIAGNOSIS — K922 Gastrointestinal hemorrhage, unspecified: Secondary | ICD-10-CM

## 2023-12-24 DIAGNOSIS — I700 Atherosclerosis of aorta: Secondary | ICD-10-CM | POA: Diagnosis not present

## 2023-12-24 DIAGNOSIS — K921 Melena: Secondary | ICD-10-CM | POA: Diagnosis not present

## 2023-12-24 LAB — COMPREHENSIVE METABOLIC PANEL WITH GFR
ALT: 29 U/L (ref 0–44)
AST: 29 U/L (ref 15–41)
Albumin: 4 g/dL (ref 3.5–5.0)
Alkaline Phosphatase: 61 U/L (ref 38–126)
Anion gap: 10 (ref 5–15)
BUN: 14 mg/dL (ref 8–23)
CO2: 27 mmol/L (ref 22–32)
Calcium: 9.5 mg/dL (ref 8.9–10.3)
Chloride: 103 mmol/L (ref 98–111)
Creatinine, Ser: 1.18 mg/dL (ref 0.61–1.24)
GFR, Estimated: >60 mL/min (ref >60)
Glucose, Bld: 123 mg/dL — ABNORMAL HIGH (ref 70–99)
Potassium: 4 mmol/L (ref 3.5–5.1)
Sodium: 140 mmol/L (ref 135–145)
Total Bilirubin: 0.8 mg/dL (ref 0.0–1.2)
Total Protein: 7.7 g/dL (ref 6.5–8.1)

## 2023-12-24 LAB — TYPE AND SCREEN
ABO/RH(D): O POS
Antibody Screen: NEGATIVE

## 2023-12-24 LAB — URINALYSIS, ROUTINE W REFLEX MICROSCOPIC
Bilirubin Urine: NEGATIVE
Glucose, UA: NEGATIVE mg/dL
Hgb urine dipstick: NEGATIVE
Ketones, ur: NEGATIVE mg/dL
Leukocytes,Ua: NEGATIVE
Nitrite: NEGATIVE
Protein, ur: NEGATIVE mg/dL
Specific Gravity, Urine: 1.023 (ref 1.005–1.030)
pH: 7 (ref 5.0–8.0)

## 2023-12-24 LAB — CBC
HCT: 47.2 % (ref 39.0–52.0)
Hemoglobin: 16.1 g/dL (ref 13.0–17.0)
MCH: 34.2 pg — ABNORMAL HIGH (ref 26.0–34.0)
MCHC: 34.1 g/dL (ref 30.0–36.0)
MCV: 100.2 fL — ABNORMAL HIGH (ref 80.0–100.0)
Platelets: 314 K/uL (ref 150–400)
RBC: 4.71 MIL/uL (ref 4.22–5.81)
RDW: 12.6 % (ref 11.5–15.5)
WBC: 8.9 K/uL (ref 4.0–10.5)
nRBC: 0 % (ref 0.0–0.2)

## 2023-12-24 MED ORDER — AMOXICILLIN-POT CLAVULANATE 875-125 MG PO TABS: 1.0000 | ORAL_TABLET | Freq: Two times a day (BID) | ORAL | 0 refills | Status: DC

## 2023-12-24 MED ORDER — IOHEXOL 350 MG/ML SOLN
100.0000 mL | Freq: Once | INTRAVENOUS | Status: AC | PRN
  Administered 2023-12-24: 75 mL via INTRAVENOUS

## 2023-12-24 MED ORDER — PANTOPRAZOLE SODIUM 40 MG IV SOLR
40.0000 mg | Freq: Once | INTRAVENOUS | Status: AC
  Administered 2023-12-24: 40 mg via INTRAVENOUS
  Filled 2023-12-24: qty 10

## 2023-12-24 NOTE — ED Triage Notes (Signed)
 Pt came in via POV d/t the last year having intermittent phases of persistent diarrhea. Reports trying meds he was given & diet changes. States that today when he woke up he noticed some bloody stool since then. Does have some heart burn & the burning sensation in his mid abd but denies any true acute abd pain, denies n/v. A/Ox4.

## 2023-12-24 NOTE — ED Triage Notes (Signed)
 Pt report having four episodes of blood in the stool since today.  Reports epigastric pain since yesterday. Denies use of blood thinners.   Alert and oriented x4.

## 2023-12-24 NOTE — Telephone Encounter (Signed)
  FYI Only or Action Required?: FYI only for provider: urgent care advised.  Patient was last seen in primary care on 09/13/2023 by Copland, Harlene BROCKS, MD.  Called Nurse Triage reporting Rectal Bleeding.  Symptoms began today.  Interventions attempted: Nothing.  Symptoms are: unchanged.  Triage Disposition: See Physician Within 24 Hours  Patient/caregiver understands and will follow disposition?: Yes   Copied from CRM #[id number]. Topic: Clinical - Red Word Triage >> Dec 24, 2023  1:35 PM Lauren C wrote: Red Word that prompted transfer to Nurse Triage: Blood in stool starting today. Reason for Disposition  MODERATE rectal bleeding (e.g., small blood clots, passing blood without stool, or toilet water turns red)  Answer Assessment - Initial Assessment Questions Alfa Gal-made dietary changes still having loose stool. Now having deep red bleeding per rectum.   Urgent care advised, patient in agreement.    1. APPEARANCE of BLOOD: What color is it? Is it passed separately, on the surface of the stool, or mixed in with the stool?      Covered toilet paper deep red  2. AMOUNT: How much blood was passed?      Good amount on toilet paper, does not appear to be mixed in with stool 3. FREQUENCY: How many times has blood been passed with the stools?       3 times today  4. ONSET: When was the blood first seen in the stools? (Days or weeks)      Today  5. DIARRHEA: Is there also some diarrhea? If Yes, ask: How many diarrhea stools in the past 24 hours?      loose 6. CONSTIPATION: Do you have constipation? If Yes, ask: How bad is it?     Denies  7. RECURRENT SYMPTOMS: Have you had blood in your stools before? If Yes, ask: When was the last time? and What happened that time?      Yes-dx alfa gal made dietary changes  8. BLOOD THINNERS: Do you take any blood thinners? (e.g., aspirin , clopidogrel / Plavix, coumadin, heparin). Notes: Other strong blood thinners  include: Arixtra (fondaparinux), Eliquis (apixaban), Pradaxa (dabigatran), and Xarelto (rivaroxaban).      9. OTHER SYMPTOMS: Do you have any other symptoms?  (e.g., abdomen pain, vomiting, dizziness, fever)     Slight lightheaded today, denies all other symptoms.  10. PREGNANCY: Is there any chance you are pregnant? When was your last menstrual period?  Protocols used: Rectal Bleeding-A-AH

## 2023-12-24 NOTE — Telephone Encounter (Signed)
FYI. Pt going to UC.

## 2023-12-24 NOTE — ED Provider Notes (Signed)
 [place] EMERGENCY DEPARTMENT AT [facility] Provider Note   CSN: [id number] Arrival date & time: 12/24/23  1527     Patient presents with: Rectal Bleeding   Joel Black is a 68 y.o. male. Hx of HTN, vertigo, spinal stenosis, [facility] spotted fever and lung nodule presenting with 4 episodes of blood in stool today.  History per patient.  Endorses he has been having epigastric pain, multiple episodes of diarrhea over the past few days.  Reports today, he woke up and had blood per rectum, which happened another 3 times after the initial occurrence.  Denies use of blood thinners.  Denies syncope or near syncope, lightheadedness or dizziness.  Patient Dors this is epigastric pain and acid reflux symptoms have been occurring pretty significantly today.  Denies nausea or vomiting.  Endorses on the fourth BM today, he states that the blood has significantly proved and there was only small flecks of blood present.  Also denies pain with BM, denies pain or burning with urination.  Endorses that the blood was primarily bright red, with some clots present.  Denies history of anal fissures or hemorrhoids.  Endorses acid reflux feels better today, however still has some amount of pain.  {Add pertinent medical, surgical, social history, OB history to YEP:67052}  Rectal Bleeding      Prior to Admission medications   Medication Sig Start Date End Date Taking? Authorizing Provider  hydrochlorothiazide  (MICROZIDE ) 12.5 MG capsule Take 1 capsule (12.5 mg total) by mouth daily. 10/22/23   Copland, Harlene BROCKS, MD  losartan  (COZAAR ) 100 MG tablet TAKE 1 TABLET (100 MG TOTAL) BY MOUTH DAILY. NEEDS APPT 11/01/23   Copland, Harlene BROCKS, MD  metoprolol  succinate (TOPROL -XL) 25 MG 24 hr tablet Take 1 tablet (25 mg total) by mouth daily. 07/19/23   Copland, Jessica C, MD    Allergies: Patient has no known allergies.    Review of Systems  Gastrointestinal:  Positive for hematochezia.     Updated Vital Signs BP (!) 167/94 (BP Location: Right Arm)   Pulse 100   Temp (!) 97.5 F (36.4 C)   Resp 17   Ht 5' 11.5 (1.816 m)   Wt 83.9 kg   SpO2 97%   BMI 25.44 kg/m   Physical Exam Vitals and nursing note reviewed. Exam conducted with a chaperone present.  Constitutional:      General: He is not in acute distress.    Appearance: He is well-developed.  HENT:     Head: Normocephalic and atraumatic.     Mouth/Throat:     Mouth: Mucous membranes are moist.     Pharynx: Oropharynx is clear.  Eyes:     Conjunctiva/sclera: Conjunctivae normal.  Cardiovascular:     Rate and Rhythm: Normal rate and regular rhythm.     Heart sounds: No murmur heard. Pulmonary:     Effort: Pulmonary effort is normal. No respiratory distress.     Breath sounds: Normal breath sounds.  Abdominal:     General: Abdomen is flat. There is no distension.     Palpations: Abdomen is soft.     Tenderness: There is abdominal tenderness. There is no right CVA tenderness, left CVA tenderness or guarding.     Comments: Mild TTP in left lower quadrant.  Genitourinary:    Rectum: Guaiac result positive.     Comments: No significant amount of blood on rectal exam, no external hemorrhoids or anal fissures appreciated.  No internal hemorrhoids appreciated on internal exam,  prostate a megaly appreciated without masses appreciated.  Guaiac was grossly positive. Musculoskeletal:        General: No swelling.     Cervical back: Neck supple.  Skin:    General: Skin is warm and dry.     Capillary Refill: Capillary refill takes less than 2 seconds.  Neurological:     General: No focal deficit present.     Mental Status: He is alert and oriented to person, place, and time.     Cranial Nerves: No cranial nerve deficit.     Sensory: No sensory deficit.     Motor: No weakness.     Gait: Gait normal.  Psychiatric:        Mood and Affect: Mood normal.     (all labs ordered are listed, but only abnormal  results are displayed) Labs Reviewed  COMPREHENSIVE METABOLIC PANEL WITH GFR - Abnormal; Notable for the following components:      Result Value   Glucose, Bld 123 (*)    All other components within normal limits  CBC - Abnormal; Notable for the following components:   MCV 100.2 (*)    MCH 34.2 (*)    All other components within normal limits  POC OCCULT BLOOD, ED  TYPE AND SCREEN    EKG: None  Radiology: No results found.  {Document cardiac monitor, telemetry assessment procedure when appropriate:32947} Procedures   Medications Ordered in the ED - No data to display    {Click here for ABCD2, HEART and other calculators REFRESH Note before signing:1}                              Medical Decision Making Amount and/or Complexity of Data Reviewed Labs: ordered. Radiology: ordered.   ***  {Document critical care time when appropriate  Document review of labs and clinical decision tools ie CHADS2VASC2, etc  Document your independent review of radiology images and any outside records  Document your discussion with family members, caretakers and with consultants  Document social determinants of health affecting pt's care  Document your decision making why or why not admission, treatments were needed:32947:::1}   Final diagnoses:  None    ED Discharge Orders     None

## 2023-12-24 NOTE — Discharge Instructions (Addendum)
 I would highly recommend that if you continue to have episodes of blood in your stool, if you feel lightheadedness or dizziness please come back to the ED.

## 2023-12-27 NOTE — Progress Notes (Unsigned)
 [facility] at [facility] [address], [address] [place], [place] [address] [phone]  Date:  12/29/2023   Name:  Joel Black   DOB:  10-29-1955   MRN:  [id number]  PCP:  Watt Harlene BROCKS, MD    Chief Complaint: No chief complaint on file.   History of Present Illness:  Joel Black is a 68 y.o. very pleasant male patient who presents with the following:  Pt seen today with concern of abnormal sound in both ears  I saw him most recently in August for routine follow-up visit History of hypertension, cervical spine stenosis, chronic pancreatitis/elevated lipase His wife [name] passed away 27-May-2024in an accident He was in the ER earlier this month with diverticulitis and a lower GI bleed-he had a CT angiogram abdomen and pelvis as below  IMPRESSION: VASCULAR 1. No evidence of active gastrointestinal bleeding. 2. Aortic Atherosclerosis (ICD10-I70.0). NON-VASCULAR 1. Mild pericolonic fat stranding involving the proximal descending colon, which may reflect mild acute uncomplicated diverticulitis or colitis. 2. Hepatic steatosis. 3. Small hiatal hernia.  -Flu shot -Recommend shingles vaccine  Discussed the use of AI scribe software for clinical note transcription with the patient, who gave verbal consent to proceed.  History of Present Illness    Patient Active Problem List   Diagnosis Date Noted   Vertigo 07/24/2017   Atypical chest pain 09/21/2015   BRBPR (bright red blood per rectum)    Stenosis, cervical spine 02/13/2015   Shortness of breath 03/19/2014   Solitary pulmonary nodule on lung CT 02/18/2014   HTN (hypertension) 10/21/2012    Past Medical History:  Diagnosis Date   Allergy to alpha-gal    Chronic pancreatitis (HCC)    Herniated cervical disc    History of kidney stones    Hypertension    MRSA (methicillin resistant Staphylococcus aureus)    Ulcer     Past Surgical History:   Procedure Laterality Date   DUPUYTREN CONTRACTURE RELEASE Left     Social History   Tobacco Use   Smoking status: Former    Current packs/day: 0.00    Average packs/day: 2.0 packs/day for 15.0 years (30.0 ttl pk-yrs)    Types: Cigarettes    Start date: 06/11/1971    Quit date: 06/11/1986    Years since quitting: 37.5   Smokeless tobacco: Never  Vaping Use   Vaping status: Never Used  Substance Use Topics   Alcohol use: Yes    Alcohol/week: 21.0 standard drinks of alcohol    Types: 21 Glasses of wine per week    Comment: occasional glass of wine   Drug use: No    Family History  Problem Relation Age of Onset   Cancer Father        lung   Diabetes Sister    Colon cancer Neg Hx    Stomach cancer Neg Hx     No Known Allergies  Medication list has been reviewed and updated.  Current Outpatient Medications on File Prior to Visit  Medication Sig Dispense Refill   amoxicillin -clavulanate (AUGMENTIN ) 875-125 MG tablet Take 1 tablet by mouth every 12 (twelve) hours. 14 tablet 0   hydrochlorothiazide  (MICROZIDE ) 12.5 MG capsule Take 1 capsule (12.5 mg total) by mouth daily. 90 capsule 1   losartan  (COZAAR ) 100 MG tablet TAKE 1 TABLET (100 MG TOTAL) BY MOUTH DAILY. NEEDS APPT 90 tablet 1   metoprolol  succinate (TOPROL -XL) 25 MG 24 hr tablet Take  1 tablet (25 mg total) by mouth daily. 90 tablet 3   No current facility-administered medications on file prior to visit.    Review of Systems:  As per HPI- otherwise negative.   Physical Examination: There were no vitals filed for this visit. There were no vitals filed for this visit. There is no height or weight on file to calculate BMI. Ideal Body Weight:    GEN: no acute distress. HEENT: Atraumatic, Normocephalic.  Ears and Nose: No external deformity. CV: RRR, No M/G/R. No JVD. No thrill. No extra heart sounds. PULM: CTA B, no wheezes, crackles, rhonchi. No retractions. No resp. distress. No accessory muscle use. ABD:  S, NT, ND, +BS. No rebound. No HSM. EXTR: No c/c/e PSYCH: Normally interactive. Conversant.    Assessment and Plan: No diagnosis found.  Assessment & Plan   Signed Harlene Schroeder, MD

## 2023-12-27 NOTE — Patient Instructions (Incomplete)
 It was good to see you today If not done already recommend a seasonal flu shot and the 2 dose shingles vaccine series

## 2023-12-29 ENCOUNTER — Ambulatory Visit: Admitting: Family Medicine

## 2023-12-29 ENCOUNTER — Ambulatory Visit (HOSPITAL_BASED_OUTPATIENT_CLINIC_OR_DEPARTMENT_OTHER)
Admission: RE | Admit: 2023-12-29 | Discharge: 2023-12-29 | Disposition: A | Discharge status: Home / Self Care | Source: Ambulatory Visit | Attending: Family Medicine | Admitting: Family Medicine

## 2023-12-29 ENCOUNTER — Encounter: Payer: Self-pay | Admitting: Family Medicine

## 2023-12-29 VITALS — BP 128/78 | HR 81 | Temp 97.9°F | Resp 16 | Ht 71.5 in | Wt 189.8 lb

## 2023-12-29 DIAGNOSIS — M25561 Pain in right knee: Secondary | ICD-10-CM | POA: Insufficient documentation

## 2023-12-29 DIAGNOSIS — I10 Essential (primary) hypertension: Secondary | ICD-10-CM | POA: Diagnosis not present

## 2023-12-29 DIAGNOSIS — M25461 Effusion, right knee: Secondary | ICD-10-CM | POA: Diagnosis not present

## 2023-12-29 DIAGNOSIS — R972 Elevated prostate specific antigen [PSA]: Secondary | ICD-10-CM

## 2023-12-29 DIAGNOSIS — H9313 Tinnitus, bilateral: Secondary | ICD-10-CM | POA: Diagnosis not present

## 2023-12-29 DIAGNOSIS — M1711 Unilateral primary osteoarthritis, right knee: Secondary | ICD-10-CM | POA: Diagnosis not present

## 2024-01-03 DIAGNOSIS — H02412 Mechanical ptosis of left eyelid: Secondary | ICD-10-CM | POA: Diagnosis not present

## 2024-01-03 DIAGNOSIS — H53483 Generalized contraction of visual field, bilateral: Secondary | ICD-10-CM | POA: Diagnosis not present

## 2024-01-03 DIAGNOSIS — H02421 Myogenic ptosis of right eyelid: Secondary | ICD-10-CM | POA: Diagnosis not present

## 2024-01-03 DIAGNOSIS — H02411 Mechanical ptosis of right eyelid: Secondary | ICD-10-CM | POA: Diagnosis not present

## 2024-01-03 DIAGNOSIS — H02834 Dermatochalasis of left upper eyelid: Secondary | ICD-10-CM | POA: Diagnosis not present

## 2024-01-03 DIAGNOSIS — H02422 Myogenic ptosis of left eyelid: Secondary | ICD-10-CM | POA: Diagnosis not present

## 2024-01-03 DIAGNOSIS — H0279 Other degenerative disorders of eyelid and periocular area: Secondary | ICD-10-CM | POA: Diagnosis not present

## 2024-01-03 DIAGNOSIS — Z01818 Encounter for other preprocedural examination: Secondary | ICD-10-CM | POA: Diagnosis not present

## 2024-01-03 DIAGNOSIS — H02831 Dermatochalasis of right upper eyelid: Secondary | ICD-10-CM | POA: Diagnosis not present

## 2024-01-03 DIAGNOSIS — H02423 Myogenic ptosis of bilateral eyelids: Secondary | ICD-10-CM | POA: Diagnosis not present

## 2024-01-03 DIAGNOSIS — H02413 Mechanical ptosis of bilateral eyelids: Secondary | ICD-10-CM | POA: Diagnosis not present

## 2024-01-03 DIAGNOSIS — H57813 Brow ptosis, bilateral: Secondary | ICD-10-CM | POA: Diagnosis not present

## 2024-01-04 ENCOUNTER — Encounter: Payer: Self-pay | Admitting: Family Medicine

## 2024-01-05 DIAGNOSIS — M720 Palmar fascial fibromatosis [Dupuytren]: Secondary | ICD-10-CM | POA: Diagnosis not present

## 2024-01-05 DIAGNOSIS — G5601 Carpal tunnel syndrome, right upper limb: Secondary | ICD-10-CM | POA: Diagnosis not present

## 2024-12-27 ENCOUNTER — Ambulatory Visit
